# Patient Record
Sex: Female | Born: 1950 | Race: White | Hispanic: No | Smoking: Former smoker
Health system: Southern US, Community
[De-identification: ages and names within clinical notes are randomized; demographics above are authoritative.]

## PROBLEM LIST (undated history)

## (undated) DIAGNOSIS — K219 Gastro-esophageal reflux disease without esophagitis: Secondary | ICD-10-CM

## (undated) DIAGNOSIS — J449 Chronic obstructive pulmonary disease, unspecified: Secondary | ICD-10-CM

## (undated) DIAGNOSIS — J439 Emphysema, unspecified: Secondary | ICD-10-CM

## (undated) DIAGNOSIS — R32 Unspecified urinary incontinence: Secondary | ICD-10-CM

## (undated) DIAGNOSIS — Z8619 Personal history of other infectious and parasitic diseases: Secondary | ICD-10-CM

## (undated) DIAGNOSIS — R519 Headache, unspecified: Secondary | ICD-10-CM

## (undated) DIAGNOSIS — C801 Malignant (primary) neoplasm, unspecified: Secondary | ICD-10-CM

## (undated) DIAGNOSIS — M1990 Unspecified osteoarthritis, unspecified site: Secondary | ICD-10-CM

## (undated) DIAGNOSIS — T7840XA Allergy, unspecified, initial encounter: Secondary | ICD-10-CM

## (undated) HISTORY — PX: COLON SURGERY: SHX602

## (undated) HISTORY — DX: Gastro-esophageal reflux disease without esophagitis: K21.9

## (undated) HISTORY — DX: Headache, unspecified: R51.9

## (undated) HISTORY — DX: Emphysema, unspecified: J43.9

## (undated) HISTORY — DX: Allergy, unspecified, initial encounter: T78.40XA

## (undated) HISTORY — DX: Personal history of other infectious and parasitic diseases: Z86.19

## (undated) HISTORY — DX: Unspecified urinary incontinence: R32

## (undated) HISTORY — DX: Unspecified osteoarthritis, unspecified site: M19.90

---

## 2004-06-04 ENCOUNTER — Emergency Department: Payer: Self-pay | Admitting: Emergency Medicine

## 2004-09-07 ENCOUNTER — Emergency Department: Payer: Self-pay | Admitting: Internal Medicine

## 2005-01-22 ENCOUNTER — Emergency Department: Payer: Self-pay | Admitting: Emergency Medicine

## 2005-03-26 ENCOUNTER — Emergency Department: Payer: Self-pay | Admitting: Emergency Medicine

## 2005-03-26 ENCOUNTER — Other Ambulatory Visit: Payer: Self-pay

## 2005-05-18 ENCOUNTER — Emergency Department: Payer: Self-pay | Admitting: Emergency Medicine

## 2005-07-02 ENCOUNTER — Emergency Department: Payer: Self-pay | Admitting: Emergency Medicine

## 2005-09-22 ENCOUNTER — Emergency Department: Payer: Self-pay | Admitting: Emergency Medicine

## 2005-09-30 ENCOUNTER — Emergency Department: Payer: Self-pay | Admitting: Internal Medicine

## 2006-01-03 ENCOUNTER — Emergency Department: Payer: Self-pay | Admitting: Internal Medicine

## 2007-04-01 ENCOUNTER — Emergency Department: Payer: Self-pay | Admitting: Emergency Medicine

## 2007-05-19 ENCOUNTER — Other Ambulatory Visit: Payer: Self-pay

## 2007-05-19 ENCOUNTER — Emergency Department: Payer: Self-pay | Admitting: Emergency Medicine

## 2007-08-17 ENCOUNTER — Emergency Department: Payer: Self-pay

## 2007-12-13 ENCOUNTER — Emergency Department: Payer: Self-pay | Admitting: Emergency Medicine

## 2008-01-17 ENCOUNTER — Emergency Department: Payer: Self-pay | Admitting: Emergency Medicine

## 2008-08-27 ENCOUNTER — Emergency Department: Payer: Self-pay | Admitting: Emergency Medicine

## 2008-08-30 ENCOUNTER — Emergency Department: Payer: Self-pay | Admitting: Emergency Medicine

## 2008-09-14 ENCOUNTER — Emergency Department: Payer: Self-pay | Admitting: Emergency Medicine

## 2008-09-27 ENCOUNTER — Ambulatory Visit: Payer: Self-pay | Admitting: Internal Medicine

## 2009-03-10 ENCOUNTER — Emergency Department: Payer: Self-pay | Admitting: Internal Medicine

## 2009-11-02 ENCOUNTER — Emergency Department: Payer: Self-pay | Admitting: Emergency Medicine

## 2009-12-16 ENCOUNTER — Emergency Department: Payer: Self-pay | Admitting: Emergency Medicine

## 2010-02-01 ENCOUNTER — Emergency Department: Payer: Self-pay | Admitting: Emergency Medicine

## 2010-12-07 ENCOUNTER — Emergency Department: Payer: Self-pay | Admitting: Unknown Physician Specialty

## 2011-01-19 ENCOUNTER — Emergency Department: Payer: Self-pay | Admitting: Emergency Medicine

## 2011-05-12 ENCOUNTER — Emergency Department: Payer: Self-pay | Admitting: Emergency Medicine

## 2011-07-31 ENCOUNTER — Emergency Department: Payer: Self-pay | Admitting: Emergency Medicine

## 2011-10-31 ENCOUNTER — Emergency Department: Payer: Self-pay | Admitting: *Deleted

## 2011-10-31 LAB — COMPREHENSIVE METABOLIC PANEL
Albumin: 3.7 g/dL (ref 3.4–5.0)
Alkaline Phosphatase: 75 U/L (ref 50–136)
Anion Gap: 11 (ref 7–16)
BUN: 21 mg/dL — ABNORMAL HIGH (ref 7–18)
Bilirubin,Total: 0.5 mg/dL (ref 0.2–1.0)
Calcium, Total: 8.7 mg/dL (ref 8.5–10.1)
Chloride: 104 mmol/L (ref 98–107)
Co2: 28 mmol/L (ref 21–32)
Creatinine: 0.74 mg/dL (ref 0.60–1.30)
EGFR (African American): >60
EGFR (Non-African Amer.): >60
Glucose: 63 mg/dL — ABNORMAL LOW (ref 65–99)
Osmolality: 286 (ref 275–301)
Potassium: 4.5 mmol/L (ref 3.5–5.1)
SGOT(AST): 21 U/L (ref 15–37)
SGPT (ALT): 16 U/L
Sodium: 143 mmol/L (ref 136–145)
Total Protein: 7 g/dL (ref 6.4–8.2)

## 2011-10-31 LAB — CBC
HCT: 40.2 % (ref 35.0–47.0)
HGB: 13.5 g/dL (ref 12.0–16.0)
MCH: 30 pg (ref 26.0–34.0)
MCHC: 33.6 g/dL (ref 32.0–36.0)
MCV: 89 fL (ref 80–100)
Platelet: 213 10*3/uL (ref 150–440)
RBC: 4.51 10*6/uL (ref 3.80–5.20)
RDW: 13.3 % (ref 11.5–14.5)
WBC: 5.9 10*3/uL (ref 3.6–11.0)

## 2011-10-31 LAB — CK TOTAL AND CKMB (NOT AT ARMC)
CK, Total: 102 U/L (ref 21–215)
CK-MB: 2.2 ng/mL (ref 0.5–3.6)

## 2011-10-31 LAB — TROPONIN I: Troponin-I: <0.02 ng/mL

## 2012-08-06 ENCOUNTER — Emergency Department: Payer: Self-pay | Admitting: Emergency Medicine

## 2012-08-06 LAB — COMPREHENSIVE METABOLIC PANEL
Albumin: 3.6 g/dL (ref 3.4–5.0)
Alkaline Phosphatase: 107 U/L (ref 50–136)
Anion Gap: 4 — ABNORMAL LOW (ref 7–16)
BUN: 11 mg/dL (ref 7–18)
Bilirubin,Total: 0.4 mg/dL (ref 0.2–1.0)
Calcium, Total: 8.4 mg/dL — ABNORMAL LOW (ref 8.5–10.1)
Chloride: 108 mmol/L — ABNORMAL HIGH (ref 98–107)
Co2: 30 mmol/L (ref 21–32)
Creatinine: 0.61 mg/dL (ref 0.60–1.30)
EGFR (African American): >60
EGFR (Non-African Amer.): >60
Glucose: 82 mg/dL (ref 65–99)
Osmolality: 282 (ref 275–301)
Potassium: 4.2 mmol/L (ref 3.5–5.1)
SGOT(AST): 22 U/L (ref 15–37)
SGPT (ALT): 17 U/L (ref 12–78)
Sodium: 142 mmol/L (ref 136–145)
Total Protein: 6.3 g/dL — ABNORMAL LOW (ref 6.4–8.2)

## 2012-08-06 LAB — URINALYSIS, COMPLETE
Bacteria: NONE SEEN
Bilirubin,UR: NEGATIVE
Blood: NEGATIVE
Glucose,UR: NEGATIVE mg/dL (ref 0–75)
Ketone: NEGATIVE
Leukocyte Esterase: NEGATIVE
Nitrite: NEGATIVE
Ph: 5 (ref 4.5–8.0)
Protein: NEGATIVE
RBC,UR: NONE SEEN /HPF (ref 0–5)
Specific Gravity: 1.011 (ref 1.003–1.030)
Squamous Epithelial: <1
WBC UR: <1 /HPF (ref 0–5)

## 2012-08-06 LAB — CBC
HCT: 40.3 % (ref 35.0–47.0)
HGB: 13.4 g/dL (ref 12.0–16.0)
MCH: 29.4 pg (ref 26.0–34.0)
MCHC: 33.1 g/dL (ref 32.0–36.0)
MCV: 89 fL (ref 80–100)
Platelet: 180 10*3/uL (ref 150–440)
RBC: 4.54 10*6/uL (ref 3.80–5.20)
RDW: 13.3 % (ref 11.5–14.5)
WBC: 5.9 10*3/uL (ref 3.6–11.0)

## 2012-09-04 ENCOUNTER — Emergency Department: Payer: Self-pay | Admitting: Emergency Medicine

## 2013-09-29 ENCOUNTER — Emergency Department: Payer: Self-pay | Admitting: Emergency Medicine

## 2013-12-26 ENCOUNTER — Emergency Department: Payer: Self-pay | Admitting: Emergency Medicine

## 2013-12-26 LAB — COMPREHENSIVE METABOLIC PANEL
Albumin: 3.3 g/dL — ABNORMAL LOW (ref 3.4–5.0)
Alkaline Phosphatase: 91 U/L
Anion Gap: 6 — ABNORMAL LOW (ref 7–16)
BUN: 15 mg/dL (ref 7–18)
Bilirubin,Total: 0.3 mg/dL (ref 0.2–1.0)
Calcium, Total: 9 mg/dL (ref 8.5–10.1)
Chloride: 106 mmol/L (ref 98–107)
Co2: 29 mmol/L (ref 21–32)
Creatinine: 0.7 mg/dL (ref 0.60–1.30)
EGFR (African American): >60
EGFR (Non-African Amer.): >60
Glucose: 79 mg/dL (ref 65–99)
Osmolality: 281 (ref 275–301)
Potassium: 3.5 mmol/L (ref 3.5–5.1)
SGOT(AST): 18 U/L (ref 15–37)
SGPT (ALT): 21 U/L (ref 12–78)
Sodium: 141 mmol/L (ref 136–145)
Total Protein: 6.4 g/dL (ref 6.4–8.2)

## 2013-12-26 LAB — URINALYSIS, COMPLETE
Bacteria: NONE SEEN
Bilirubin,UR: NEGATIVE
Glucose,UR: NEGATIVE mg/dL (ref 0–75)
Ketone: NEGATIVE
Leukocyte Esterase: NEGATIVE
Nitrite: NEGATIVE
Ph: 5 (ref 4.5–8.0)
Protein: NEGATIVE
RBC,UR: NONE SEEN /HPF (ref 0–5)
Specific Gravity: 1.01 (ref 1.003–1.030)
Squamous Epithelial: <1
WBC UR: 1 /HPF (ref 0–5)

## 2013-12-26 LAB — CBC WITH DIFFERENTIAL/PLATELET
Basophil #: 0.1 10*3/uL (ref 0.0–0.1)
Basophil %: 0.7 %
Eosinophil #: 0.2 10*3/uL (ref 0.0–0.7)
Eosinophil %: 2.8 %
HCT: 42.9 % (ref 35.0–47.0)
HGB: 13.8 g/dL (ref 12.0–16.0)
Lymphocyte #: 3.3 10*3/uL (ref 1.0–3.6)
Lymphocyte %: 37.8 %
MCH: 29.3 pg (ref 26.0–34.0)
MCHC: 32.3 g/dL (ref 32.0–36.0)
MCV: 91 fL (ref 80–100)
Monocyte #: 0.5 x10 3/mm (ref 0.2–0.9)
Monocyte %: 5.9 %
Neutrophil #: 4.6 10*3/uL (ref 1.4–6.5)
Neutrophil %: 52.8 %
Platelet: 223 10*3/uL (ref 150–440)
RBC: 4.73 10*6/uL (ref 3.80–5.20)
RDW: 13.9 % (ref 11.5–14.5)
WBC: 8.7 10*3/uL (ref 3.6–11.0)

## 2013-12-26 LAB — LIPASE, BLOOD: Lipase: 234 U/L (ref 73–393)

## 2014-01-13 ENCOUNTER — Emergency Department: Payer: Self-pay | Admitting: Emergency Medicine

## 2014-01-13 LAB — COMPREHENSIVE METABOLIC PANEL
Albumin: 3.4 g/dL (ref 3.4–5.0)
Alkaline Phosphatase: 95 U/L
Anion Gap: 4 — ABNORMAL LOW (ref 7–16)
BUN: 9 mg/dL (ref 7–18)
Bilirubin,Total: 1.2 mg/dL — ABNORMAL HIGH (ref 0.2–1.0)
Calcium, Total: 8.9 mg/dL (ref 8.5–10.1)
Chloride: 103 mmol/L (ref 98–107)
Co2: 31 mmol/L (ref 21–32)
Creatinine: 0.67 mg/dL (ref 0.60–1.30)
EGFR (African American): >60
EGFR (Non-African Amer.): >60
Glucose: 98 mg/dL (ref 65–99)
Osmolality: 274 (ref 275–301)
Potassium: 3.5 mmol/L (ref 3.5–5.1)
SGOT(AST): 14 U/L — ABNORMAL LOW (ref 15–37)
SGPT (ALT): 15 U/L (ref 12–78)
Sodium: 138 mmol/L (ref 136–145)
Total Protein: 7.1 g/dL (ref 6.4–8.2)

## 2014-01-13 LAB — URINALYSIS, COMPLETE
Bacteria: NONE SEEN
Bilirubin,UR: NEGATIVE
Glucose,UR: NEGATIVE mg/dL (ref 0–75)
Leukocyte Esterase: NEGATIVE
Nitrite: NEGATIVE
Ph: 5 (ref 4.5–8.0)
Protein: NEGATIVE
RBC,UR: 1 /HPF (ref 0–5)
Specific Gravity: 1.012 (ref 1.003–1.030)
Squamous Epithelial: 1
WBC UR: <1 /HPF (ref 0–5)

## 2014-01-13 LAB — CK TOTAL AND CKMB (NOT AT ARMC)
CK, Total: 74 U/L
CK-MB: 1.1 ng/mL (ref 0.5–3.6)

## 2014-01-13 LAB — CBC WITH DIFFERENTIAL/PLATELET
Basophil #: 0.1 10*3/uL (ref 0.0–0.1)
Basophil %: 0.8 %
Eosinophil #: 0.1 10*3/uL (ref 0.0–0.7)
Eosinophil %: 0.4 %
HCT: 42.7 % (ref 35.0–47.0)
HGB: 14.1 g/dL (ref 12.0–16.0)
Lymphocyte #: 1.5 10*3/uL (ref 1.0–3.6)
Lymphocyte %: 9.4 %
MCH: 29.8 pg (ref 26.0–34.0)
MCHC: 33.1 g/dL (ref 32.0–36.0)
MCV: 90 fL (ref 80–100)
Monocyte #: 1.1 x10 3/mm — ABNORMAL HIGH (ref 0.2–0.9)
Monocyte %: 7.3 %
Neutrophil #: 12.7 10*3/uL — ABNORMAL HIGH (ref 1.4–6.5)
Neutrophil %: 82.1 %
Platelet: 206 10*3/uL (ref 150–440)
RBC: 4.75 10*6/uL (ref 3.80–5.20)
RDW: 13.8 % (ref 11.5–14.5)
WBC: 15.5 10*3/uL — ABNORMAL HIGH (ref 3.6–11.0)

## 2014-01-13 LAB — TROPONIN I: Troponin-I: <0.02 ng/mL

## 2014-01-30 DIAGNOSIS — K625 Hemorrhage of anus and rectum: Secondary | ICD-10-CM | POA: Insufficient documentation

## 2014-02-27 ENCOUNTER — Ambulatory Visit: Payer: Self-pay | Admitting: Surgery

## 2014-03-06 ENCOUNTER — Other Ambulatory Visit: Payer: Self-pay | Admitting: Surgery

## 2014-03-07 LAB — CEA: CEA: 3.3 ng/mL (ref 0.0–4.7)

## 2014-03-12 ENCOUNTER — Ambulatory Visit: Payer: Self-pay | Admitting: Surgery

## 2014-03-12 LAB — CBC WITH DIFFERENTIAL/PLATELET
Basophil #: 0 10*3/uL (ref 0.0–0.1)
Basophil %: 0.6 %
Eosinophil #: 0.1 10*3/uL (ref 0.0–0.7)
Eosinophil %: 2.3 %
HCT: 43.3 % (ref 35.0–47.0)
HGB: 13.9 g/dL (ref 12.0–16.0)
Lymphocyte #: 1.8 10*3/uL (ref 1.0–3.6)
Lymphocyte %: 28.6 %
MCH: 29.1 pg (ref 26.0–34.0)
MCHC: 32.1 g/dL (ref 32.0–36.0)
MCV: 91 fL (ref 80–100)
Monocyte #: 0.4 x10 3/mm (ref 0.2–0.9)
Monocyte %: 5.6 %
Neutrophil #: 4 10*3/uL (ref 1.4–6.5)
Neutrophil %: 62.9 %
Platelet: 197 10*3/uL (ref 150–440)
RBC: 4.78 10*6/uL (ref 3.80–5.20)
RDW: 13.7 % (ref 11.5–14.5)
WBC: 6.4 10*3/uL (ref 3.6–11.0)

## 2014-03-12 LAB — BASIC METABOLIC PANEL
Anion Gap: 7 (ref 7–16)
BUN: 10 mg/dL (ref 7–18)
Calcium, Total: 8.4 mg/dL — ABNORMAL LOW (ref 8.5–10.1)
Chloride: 106 mmol/L (ref 98–107)
Co2: 28 mmol/L (ref 21–32)
Creatinine: 0.75 mg/dL (ref 0.60–1.30)
EGFR (African American): >60
EGFR (Non-African Amer.): >60
Glucose: 113 mg/dL — ABNORMAL HIGH (ref 65–99)
Osmolality: 281 (ref 275–301)
Potassium: 3.6 mmol/L (ref 3.5–5.1)
Sodium: 141 mmol/L (ref 136–145)

## 2014-03-21 ENCOUNTER — Inpatient Hospital Stay: Payer: Self-pay | Admitting: Surgery

## 2014-03-21 LAB — CREATININE, SERUM
Creatinine: 1.03 mg/dL (ref 0.60–1.30)
EGFR (African American): >60
EGFR (Non-African Amer.): 57 — ABNORMAL LOW

## 2014-03-22 LAB — CBC WITH DIFFERENTIAL/PLATELET
Basophil #: 0 10*3/uL (ref 0.0–0.1)
Basophil %: 0.2 %
Eosinophil #: 0 10*3/uL (ref 0.0–0.7)
Eosinophil %: 0 %
HCT: 39.2 % (ref 35.0–47.0)
HGB: 12.7 g/dL (ref 12.0–16.0)
Lymphocyte #: 1 10*3/uL (ref 1.0–3.6)
Lymphocyte %: 8.2 %
MCH: 29.2 pg (ref 26.0–34.0)
MCHC: 32.3 g/dL (ref 32.0–36.0)
MCV: 90 fL (ref 80–100)
Monocyte #: 1.1 x10 3/mm — ABNORMAL HIGH (ref 0.2–0.9)
Monocyte %: 9 %
Neutrophil #: 10 10*3/uL — ABNORMAL HIGH (ref 1.4–6.5)
Neutrophil %: 82.6 %
Platelet: 170 10*3/uL (ref 150–440)
RBC: 4.34 10*6/uL (ref 3.80–5.20)
RDW: 13.9 % (ref 11.5–14.5)
WBC: 12.1 10*3/uL — ABNORMAL HIGH (ref 3.6–11.0)

## 2014-03-22 LAB — BASIC METABOLIC PANEL
Anion Gap: 6 — ABNORMAL LOW (ref 7–16)
BUN: 12 mg/dL (ref 7–18)
Calcium, Total: 8.8 mg/dL (ref 8.5–10.1)
Chloride: 105 mmol/L (ref 98–107)
Co2: 29 mmol/L (ref 21–32)
Creatinine: 0.66 mg/dL (ref 0.60–1.30)
EGFR (African American): >60
EGFR (Non-African Amer.): >60
Glucose: 116 mg/dL — ABNORMAL HIGH (ref 65–99)
Osmolality: 280 (ref 275–301)
Potassium: 4.2 mmol/L (ref 3.5–5.1)
Sodium: 140 mmol/L (ref 136–145)

## 2014-03-23 LAB — CBC WITH DIFFERENTIAL/PLATELET
Basophil #: 0 10*3/uL (ref 0.0–0.1)
Basophil %: 0.3 %
Eosinophil #: 0.1 10*3/uL (ref 0.0–0.7)
Eosinophil %: 0.7 %
HCT: 40.7 % (ref 35.0–47.0)
HGB: 13.2 g/dL (ref 12.0–16.0)
Lymphocyte #: 1.4 10*3/uL (ref 1.0–3.6)
Lymphocyte %: 14.2 %
MCH: 29.1 pg (ref 26.0–34.0)
MCHC: 32.3 g/dL (ref 32.0–36.0)
MCV: 90 fL (ref 80–100)
Monocyte #: 0.8 x10 3/mm (ref 0.2–0.9)
Monocyte %: 8.7 %
Neutrophil #: 7.3 10*3/uL — ABNORMAL HIGH (ref 1.4–6.5)
Neutrophil %: 76.1 %
Platelet: 179 10*3/uL (ref 150–440)
RBC: 4.52 10*6/uL (ref 3.80–5.20)
RDW: 14 % (ref 11.5–14.5)
WBC: 9.6 10*3/uL (ref 3.6–11.0)

## 2014-03-26 LAB — BASIC METABOLIC PANEL
Anion Gap: 5 — ABNORMAL LOW (ref 7–16)
BUN: 11 mg/dL (ref 7–18)
Calcium, Total: 8.8 mg/dL (ref 8.5–10.1)
Chloride: 106 mmol/L (ref 98–107)
Co2: 27 mmol/L (ref 21–32)
Creatinine: 0.93 mg/dL (ref 0.60–1.30)
EGFR (African American): >60
EGFR (Non-African Amer.): >60
Glucose: 86 mg/dL (ref 65–99)
Osmolality: 274 (ref 275–301)
Potassium: 4.4 mmol/L (ref 3.5–5.1)
Sodium: 138 mmol/L (ref 136–145)

## 2014-03-26 LAB — MAGNESIUM: Magnesium: 1.8 mg/dL

## 2014-03-28 LAB — CBC WITH DIFFERENTIAL/PLATELET
Basophil #: 0 10*3/uL (ref 0.0–0.1)
Basophil %: 0.7 %
Eosinophil #: 0.3 10*3/uL (ref 0.0–0.7)
Eosinophil %: 3.7 %
HCT: 39.6 % (ref 35.0–47.0)
HGB: 12.9 g/dL (ref 12.0–16.0)
Lymphocyte #: 1.2 10*3/uL (ref 1.0–3.6)
Lymphocyte %: 17.5 %
MCH: 29.8 pg (ref 26.0–34.0)
MCHC: 32.6 g/dL (ref 32.0–36.0)
MCV: 91 fL (ref 80–100)
Monocyte #: 0.7 x10 3/mm (ref 0.2–0.9)
Monocyte %: 9.6 %
Neutrophil #: 4.7 10*3/uL (ref 1.4–6.5)
Neutrophil %: 68.5 %
Platelet: 207 10*3/uL (ref 150–440)
RBC: 4.33 10*6/uL (ref 3.80–5.20)
RDW: 13.7 % (ref 11.5–14.5)
WBC: 6.9 10*3/uL (ref 3.6–11.0)

## 2014-03-28 LAB — COMPREHENSIVE METABOLIC PANEL
Albumin: 2.6 g/dL — ABNORMAL LOW (ref 3.4–5.0)
Alkaline Phosphatase: 58 U/L
Anion Gap: 8 (ref 7–16)
BUN: 12 mg/dL (ref 7–18)
Bilirubin,Total: 0.6 mg/dL (ref 0.2–1.0)
Calcium, Total: 8.5 mg/dL (ref 8.5–10.1)
Chloride: 108 mmol/L — ABNORMAL HIGH (ref 98–107)
Co2: 23 mmol/L (ref 21–32)
Creatinine: 0.69 mg/dL (ref 0.60–1.30)
EGFR (African American): >60
EGFR (Non-African Amer.): >60
Glucose: 112 mg/dL — ABNORMAL HIGH (ref 65–99)
Osmolality: 278 (ref 275–301)
Potassium: 4.1 mmol/L (ref 3.5–5.1)
SGOT(AST): 37 U/L (ref 15–37)
SGPT (ALT): 28 U/L
Sodium: 139 mmol/L (ref 136–145)
Total Protein: 5.7 g/dL — ABNORMAL LOW (ref 6.4–8.2)

## 2014-03-28 LAB — PATHOLOGY REPORT

## 2014-04-02 LAB — BASIC METABOLIC PANEL
Anion Gap: 2 — ABNORMAL LOW (ref 7–16)
BUN: 7 mg/dL (ref 7–18)
Calcium, Total: 7.9 mg/dL — ABNORMAL LOW (ref 8.5–10.1)
Chloride: 107 mmol/L (ref 98–107)
Co2: 28 mmol/L (ref 21–32)
Creatinine: 0.66 mg/dL (ref 0.60–1.30)
EGFR (African American): >60
EGFR (Non-African Amer.): >60
Glucose: 123 mg/dL — ABNORMAL HIGH (ref 65–99)
Osmolality: 273 (ref 275–301)
Potassium: 3.3 mmol/L — ABNORMAL LOW (ref 3.5–5.1)
Sodium: 137 mmol/L (ref 136–145)

## 2014-04-02 LAB — CBC WITH DIFFERENTIAL/PLATELET
Basophil #: 0.1 10*3/uL (ref 0.0–0.1)
Basophil %: 0.4 %
Eosinophil #: 0 10*3/uL (ref 0.0–0.7)
Eosinophil %: 0.2 %
HCT: 35.3 % (ref 35.0–47.0)
HGB: 11.6 g/dL — ABNORMAL LOW (ref 12.0–16.0)
Lymphocyte #: 0.6 10*3/uL — ABNORMAL LOW (ref 1.0–3.6)
Lymphocyte %: 4.2 %
MCH: 29.3 pg (ref 26.0–34.0)
MCHC: 32.9 g/dL (ref 32.0–36.0)
MCV: 89 fL (ref 80–100)
Monocyte #: 1.1 x10 3/mm — ABNORMAL HIGH (ref 0.2–0.9)
Monocyte %: 8.2 %
Neutrophil #: 11.5 10*3/uL — ABNORMAL HIGH (ref 1.4–6.5)
Neutrophil %: 87 %
Platelet: 200 10*3/uL (ref 150–440)
RBC: 3.96 10*6/uL (ref 3.80–5.20)
RDW: 14 % (ref 11.5–14.5)
WBC: 13.2 10*3/uL — ABNORMAL HIGH (ref 3.6–11.0)

## 2014-04-03 LAB — COMPREHENSIVE METABOLIC PANEL
Albumin: 2 g/dL — ABNORMAL LOW (ref 3.4–5.0)
Alkaline Phosphatase: 68 U/L
BUN: 8 mg/dL (ref 7–18)
Bilirubin,Total: 0.6 mg/dL (ref 0.2–1.0)
Calcium, Total: 8 mg/dL — ABNORMAL LOW (ref 8.5–10.1)
Chloride: 105 mmol/L (ref 98–107)
Co2: 30 mmol/L (ref 21–32)
Creatinine: 0.6 mg/dL (ref 0.60–1.30)
EGFR (African American): >60
EGFR (Non-African Amer.): >60
Glucose: 110 mg/dL — ABNORMAL HIGH (ref 65–99)
Osmolality: 263 (ref 275–301)
Potassium: 3 mmol/L — ABNORMAL LOW (ref 3.5–5.1)
SGOT(AST): 13 U/L — ABNORMAL LOW (ref 15–37)
SGPT (ALT): 14 U/L
Sodium: 132 mmol/L — ABNORMAL LOW (ref 136–145)
Total Protein: 5.6 g/dL — ABNORMAL LOW (ref 6.4–8.2)

## 2014-04-03 LAB — CBC WITH DIFFERENTIAL/PLATELET
Basophil #: 0 10*3/uL (ref 0.0–0.1)
Basophil %: 0.2 %
Eosinophil #: 0 10*3/uL (ref 0.0–0.7)
Eosinophil %: 0.3 %
HCT: 35 % (ref 35.0–47.0)
HGB: 11.4 g/dL — ABNORMAL LOW (ref 12.0–16.0)
Lymphocyte #: 0.9 10*3/uL — ABNORMAL LOW (ref 1.0–3.6)
Lymphocyte %: 8.4 %
MCH: 29.2 pg (ref 26.0–34.0)
MCHC: 32.7 g/dL (ref 32.0–36.0)
MCV: 89 fL (ref 80–100)
Monocyte #: 1.1 x10 3/mm — ABNORMAL HIGH (ref 0.2–0.9)
Monocyte %: 10.2 %
Neutrophil #: 8.8 10*3/uL — ABNORMAL HIGH (ref 1.4–6.5)
Neutrophil %: 80.9 %
Platelet: 219 10*3/uL (ref 150–440)
RBC: 3.91 10*6/uL (ref 3.80–5.20)
RDW: 13.9 % (ref 11.5–14.5)
WBC: 10.9 10*3/uL (ref 3.6–11.0)

## 2014-04-03 LAB — PATHOLOGY REPORT

## 2014-04-06 LAB — BASIC METABOLIC PANEL
Anion Gap: 7 (ref 7–16)
BUN: 12 mg/dL (ref 7–18)
Calcium, Total: 8.9 mg/dL (ref 8.5–10.1)
Chloride: 98 mmol/L (ref 98–107)
Co2: 32 mmol/L (ref 21–32)
Creatinine: 0.63 mg/dL (ref 0.60–1.30)
EGFR (African American): >60
EGFR (Non-African Amer.): >60
Glucose: 121 mg/dL — ABNORMAL HIGH (ref 65–99)
Osmolality: 275 (ref 275–301)
Potassium: 4 mmol/L (ref 3.5–5.1)
Sodium: 137 mmol/L (ref 136–145)

## 2014-04-06 LAB — CBC WITH DIFFERENTIAL/PLATELET
Basophil #: 0 10*3/uL (ref 0.0–0.1)
Basophil %: 0.4 %
Eosinophil #: 0.1 10*3/uL (ref 0.0–0.7)
Eosinophil %: 1.4 %
HCT: 31.2 % — ABNORMAL LOW (ref 35.0–47.0)
HGB: 10.3 g/dL — ABNORMAL LOW (ref 12.0–16.0)
Lymphocyte #: 0.9 10*3/uL — ABNORMAL LOW (ref 1.0–3.6)
Lymphocyte %: 12.1 %
MCH: 29.3 pg (ref 26.0–34.0)
MCHC: 33.1 g/dL (ref 32.0–36.0)
MCV: 89 fL (ref 80–100)
Monocyte #: 0.9 x10 3/mm (ref 0.2–0.9)
Monocyte %: 12 %
Neutrophil #: 5.8 10*3/uL (ref 1.4–6.5)
Neutrophil %: 74.1 %
Platelet: 314 10*3/uL (ref 150–440)
RBC: 3.53 10*6/uL — ABNORMAL LOW (ref 3.80–5.20)
RDW: 13.9 % (ref 11.5–14.5)
WBC: 7.8 10*3/uL (ref 3.6–11.0)

## 2014-04-06 LAB — MAGNESIUM: Magnesium: 1.2 mg/dL — ABNORMAL LOW

## 2014-04-06 LAB — PHOSPHORUS: Phosphorus: 2.9 mg/dL (ref 2.5–4.9)

## 2014-04-07 DIAGNOSIS — I369 Nonrheumatic tricuspid valve disorder, unspecified: Secondary | ICD-10-CM

## 2014-04-07 LAB — TPN PANEL
Activated PTT: 32.7 secs (ref 23.6–35.9)
Albumin: 1.6 g/dL — ABNORMAL LOW (ref 3.4–5.0)
Alkaline Phosphatase: 69 U/L
Anion Gap: 5 — ABNORMAL LOW (ref 7–16)
BUN: 11 mg/dL (ref 7–18)
Calcium, Total: 8.7 mg/dL (ref 8.5–10.1)
Chloride: 102 mmol/L (ref 98–107)
Cholesterol: 107 mg/dL (ref 0–200)
Co2: 33 mmol/L — ABNORMAL HIGH (ref 21–32)
Creatinine: 0.61 mg/dL (ref 0.60–1.30)
EGFR (African American): >60
EGFR (Non-African Amer.): >60
Glucose: 167 mg/dL — ABNORMAL HIGH (ref 65–99)
HGB: 10.4 g/dL — ABNORMAL LOW (ref 12.0–16.0)
INR: 1.1
Magnesium: 2.2 mg/dL
Osmolality: 283 (ref 275–301)
Phosphorus: 2.8 mg/dL (ref 2.5–4.9)
Platelet: 317 10*3/uL (ref 150–440)
Potassium: 3.8 mmol/L (ref 3.5–5.1)
Prothrombin Time: 14.4 secs (ref 11.5–14.7)
SGOT(AST): 11 U/L — ABNORMAL LOW (ref 15–37)
Sodium: 140 mmol/L (ref 136–145)
Total Protein: 5.4 g/dL — ABNORMAL LOW (ref 6.4–8.2)
Triglycerides: 65 mg/dL (ref 0–200)
WBC: 10.3 10*3/uL (ref 3.6–11.0)

## 2014-04-08 LAB — CULTURE, BLOOD (SINGLE)

## 2014-04-08 LAB — BRONCHIAL WASH CULTURE

## 2014-04-08 LAB — PHOSPHORUS: Phosphorus: 2.8 mg/dL (ref 2.5–4.9)

## 2014-04-08 LAB — POTASSIUM: Potassium: 3.8 mmol/L (ref 3.5–5.1)

## 2014-04-08 LAB — CALCIUM: Calcium, Total: 8.3 mg/dL — ABNORMAL LOW (ref 8.5–10.1)

## 2014-04-08 LAB — SODIUM: Sodium: 144 mmol/L (ref 136–145)

## 2014-04-08 LAB — MAGNESIUM: Magnesium: 2.1 mg/dL

## 2014-04-09 LAB — PHOSPHORUS: Phosphorus: 3.4 mg/dL (ref 2.5–4.9)

## 2014-04-09 LAB — POTASSIUM: Potassium: 4 mmol/L (ref 3.5–5.1)

## 2014-04-09 LAB — SODIUM: Sodium: 142 mmol/L (ref 136–145)

## 2014-04-09 LAB — MAGNESIUM: Magnesium: 1.9 mg/dL

## 2014-04-09 LAB — CALCIUM: Calcium, Total: 8.4 mg/dL — ABNORMAL LOW (ref 8.5–10.1)

## 2014-04-10 LAB — BASIC METABOLIC PANEL
Anion Gap: 9 (ref 7–16)
BUN: 16 mg/dL (ref 7–18)
Calcium, Total: 8.2 mg/dL — ABNORMAL LOW (ref 8.5–10.1)
Chloride: 102 mmol/L (ref 98–107)
Co2: 29 mmol/L (ref 21–32)
Creatinine: 0.52 mg/dL — ABNORMAL LOW (ref 0.60–1.30)
EGFR (African American): >60
EGFR (Non-African Amer.): >60
Glucose: 91 mg/dL (ref 65–99)
Osmolality: 280 (ref 275–301)
Potassium: 3.7 mmol/L (ref 3.5–5.1)
Sodium: 140 mmol/L (ref 136–145)

## 2014-04-10 LAB — PHOSPHORUS: Phosphorus: 3.5 mg/dL (ref 2.5–4.9)

## 2014-04-10 LAB — MAGNESIUM: Magnesium: 1.9 mg/dL

## 2014-04-11 LAB — BASIC METABOLIC PANEL
Anion Gap: 8 (ref 7–16)
BUN: 18 mg/dL (ref 7–18)
Calcium, Total: 8.1 mg/dL — ABNORMAL LOW (ref 8.5–10.1)
Chloride: 100 mmol/L (ref 98–107)
Co2: 29 mmol/L (ref 21–32)
Creatinine: 0.58 mg/dL — ABNORMAL LOW (ref 0.60–1.30)
EGFR (African American): >60
EGFR (Non-African Amer.): >60
Glucose: 112 mg/dL — ABNORMAL HIGH (ref 65–99)
Osmolality: 276 (ref 275–301)
Potassium: 3.8 mmol/L (ref 3.5–5.1)
Sodium: 137 mmol/L (ref 136–145)

## 2014-04-11 LAB — PHOSPHORUS: Phosphorus: 3.3 mg/dL (ref 2.5–4.9)

## 2014-04-11 LAB — MAGNESIUM: Magnesium: 2 mg/dL

## 2014-05-03 ENCOUNTER — Ambulatory Visit: Payer: Self-pay | Admitting: Surgery

## 2014-05-16 DIAGNOSIS — J449 Chronic obstructive pulmonary disease, unspecified: Secondary | ICD-10-CM | POA: Insufficient documentation

## 2014-06-25 ENCOUNTER — Emergency Department: Payer: Self-pay | Admitting: Emergency Medicine

## 2014-06-25 LAB — COMPREHENSIVE METABOLIC PANEL
Albumin: 3.6 g/dL (ref 3.4–5.0)
Alkaline Phosphatase: 79 U/L
Anion Gap: 5 — ABNORMAL LOW (ref 7–16)
BUN: 14 mg/dL (ref 7–18)
Bilirubin,Total: 0.7 mg/dL (ref 0.2–1.0)
Calcium, Total: 8.8 mg/dL (ref 8.5–10.1)
Chloride: 104 mmol/L (ref 98–107)
Co2: 31 mmol/L (ref 21–32)
Creatinine: 0.65 mg/dL (ref 0.60–1.30)
EGFR (African American): >60
EGFR (Non-African Amer.): >60
Glucose: 115 mg/dL — ABNORMAL HIGH (ref 65–99)
Osmolality: 281 (ref 275–301)
Potassium: 4.2 mmol/L (ref 3.5–5.1)
SGOT(AST): 22 U/L (ref 15–37)
SGPT (ALT): 33 U/L
Sodium: 140 mmol/L (ref 136–145)
Total Protein: 6.3 g/dL — ABNORMAL LOW (ref 6.4–8.2)

## 2014-06-25 LAB — CBC
HCT: 41.1 % (ref 35.0–47.0)
HGB: 13.2 g/dL (ref 12.0–16.0)
MCH: 28 pg (ref 26.0–34.0)
MCHC: 32.1 g/dL (ref 32.0–36.0)
MCV: 88 fL (ref 80–100)
Platelet: 194 10*3/uL (ref 150–440)
RBC: 4.7 10*6/uL (ref 3.80–5.20)
RDW: 15.3 % — ABNORMAL HIGH (ref 11.5–14.5)
WBC: 6.6 10*3/uL (ref 3.6–11.0)

## 2014-06-25 LAB — URINALYSIS, COMPLETE
Bacteria: NONE SEEN
Bilirubin,UR: NEGATIVE
Blood: NEGATIVE
Glucose,UR: NEGATIVE mg/dL (ref 0–75)
Hyaline Cast: 32
Ketone: NEGATIVE
Leukocyte Esterase: NEGATIVE
Nitrite: NEGATIVE
Ph: 5 (ref 4.5–8.0)
Protein: 100
RBC,UR: <1 /HPF (ref 0–5)
Specific Gravity: 1.024 (ref 1.003–1.030)
Squamous Epithelial: 1
WBC UR: 5 /HPF (ref 0–5)

## 2014-07-17 ENCOUNTER — Emergency Department: Payer: Self-pay | Admitting: Emergency Medicine

## 2014-07-17 LAB — COMPREHENSIVE METABOLIC PANEL
Albumin: 3.5 g/dL (ref 3.4–5.0)
Alkaline Phosphatase: 87 U/L
Anion Gap: 3 — ABNORMAL LOW (ref 7–16)
BUN: 9 mg/dL (ref 7–18)
Bilirubin,Total: 0.6 mg/dL (ref 0.2–1.0)
Calcium, Total: 8.5 mg/dL (ref 8.5–10.1)
Chloride: 109 mmol/L — ABNORMAL HIGH (ref 98–107)
Co2: 29 mmol/L (ref 21–32)
Creatinine: 0.72 mg/dL (ref 0.60–1.30)
EGFR (African American): >60
EGFR (Non-African Amer.): >60
Glucose: 125 mg/dL — ABNORMAL HIGH (ref 65–99)
Osmolality: 281 (ref 275–301)
Potassium: 3.2 mmol/L — ABNORMAL LOW (ref 3.5–5.1)
SGOT(AST): 20 U/L (ref 15–37)
SGPT (ALT): 27 U/L
Sodium: 141 mmol/L (ref 136–145)
Total Protein: 6.5 g/dL (ref 6.4–8.2)

## 2014-07-17 LAB — LIPASE, BLOOD: Lipase: 139 U/L (ref 73–393)

## 2014-07-17 LAB — CBC
HCT: 41.2 % (ref 35.0–47.0)
HGB: 13.3 g/dL (ref 12.0–16.0)
MCH: 28.3 pg (ref 26.0–34.0)
MCHC: 32.2 g/dL (ref 32.0–36.0)
MCV: 88 fL (ref 80–100)
Platelet: 192 10*3/uL (ref 150–440)
RBC: 4.7 10*6/uL (ref 3.80–5.20)
RDW: 15.3 % — ABNORMAL HIGH (ref 11.5–14.5)
WBC: 6.5 10*3/uL (ref 3.6–11.0)

## 2014-08-14 DIAGNOSIS — J439 Emphysema, unspecified: Secondary | ICD-10-CM | POA: Insufficient documentation

## 2014-08-14 DIAGNOSIS — M5440 Lumbago with sciatica, unspecified side: Secondary | ICD-10-CM | POA: Insufficient documentation

## 2014-08-14 DIAGNOSIS — J432 Centrilobular emphysema: Secondary | ICD-10-CM | POA: Insufficient documentation

## 2014-08-14 DIAGNOSIS — D126 Benign neoplasm of colon, unspecified: Secondary | ICD-10-CM | POA: Insufficient documentation

## 2014-08-17 ENCOUNTER — Ambulatory Visit: Payer: Self-pay | Admitting: Family Medicine

## 2014-09-04 ENCOUNTER — Emergency Department: Payer: Self-pay | Admitting: Emergency Medicine

## 2014-09-04 LAB — TROPONIN I: Troponin-I: <0.02 ng/mL

## 2014-09-04 LAB — COMPREHENSIVE METABOLIC PANEL
Albumin: 3.7 g/dL (ref 3.4–5.0)
Alkaline Phosphatase: 107 U/L
Anion Gap: 5 — ABNORMAL LOW (ref 7–16)
BUN: 14 mg/dL (ref 7–18)
Bilirubin,Total: 0.7 mg/dL (ref 0.2–1.0)
Calcium, Total: 9.1 mg/dL (ref 8.5–10.1)
Chloride: 105 mmol/L (ref 98–107)
Co2: 31 mmol/L (ref 21–32)
Creatinine: 0.7 mg/dL (ref 0.60–1.30)
EGFR (African American): >60
EGFR (Non-African Amer.): >60
Glucose: 126 mg/dL — ABNORMAL HIGH (ref 65–99)
Osmolality: 283 (ref 275–301)
Potassium: 4 mmol/L (ref 3.5–5.1)
SGOT(AST): 31 U/L (ref 15–37)
SGPT (ALT): 28 U/L
Sodium: 141 mmol/L (ref 136–145)
Total Protein: 7 g/dL (ref 6.4–8.2)

## 2014-09-04 LAB — CBC WITH DIFFERENTIAL/PLATELET
Basophil #: 0 10*3/uL (ref 0.0–0.1)
Basophil %: 0.4 %
Eosinophil #: 0 10*3/uL (ref 0.0–0.7)
Eosinophil %: 0.5 %
HCT: 46.2 % (ref 35.0–47.0)
HGB: 14.7 g/dL (ref 12.0–16.0)
Lymphocyte #: 1.4 10*3/uL (ref 1.0–3.6)
Lymphocyte %: 19.9 %
MCH: 27.9 pg (ref 26.0–34.0)
MCHC: 31.9 g/dL — ABNORMAL LOW (ref 32.0–36.0)
MCV: 87 fL (ref 80–100)
Monocyte #: 0.3 x10 3/mm (ref 0.2–0.9)
Monocyte %: 4.3 %
Neutrophil #: 5.4 10*3/uL (ref 1.4–6.5)
Neutrophil %: 74.9 %
Platelet: 194 10*3/uL (ref 150–440)
RBC: 5.28 10*6/uL — ABNORMAL HIGH (ref 3.80–5.20)
RDW: 14.4 % (ref 11.5–14.5)
WBC: 7.2 10*3/uL (ref 3.6–11.0)

## 2014-09-04 LAB — URINALYSIS, COMPLETE
Bacteria: NONE SEEN
Bilirubin,UR: NEGATIVE
Glucose,UR: NEGATIVE mg/dL (ref 0–75)
Ketone: NEGATIVE
Leukocyte Esterase: NEGATIVE
Nitrite: NEGATIVE
Ph: 5 (ref 4.5–8.0)
Protein: NEGATIVE
RBC,UR: 2 /HPF (ref 0–5)
Specific Gravity: 1.019 (ref 1.003–1.030)
Squamous Epithelial: 1
WBC UR: 3 /HPF (ref 0–5)

## 2014-12-22 NOTE — Discharge Summary (Signed)
PATIENT NAME:  Kelli Phillips, Kelli Phillips MR#:  [id number] DATE OF BIRTH:  1950/11/09  DATE OF ADMISSION:  03/21/2014 DATE OF DISCHARGE:  04/12/2014  BRIEF HISTORY: Kelli Phillips is a 64 year old woman with a recently discovered large rectal polyp. 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 She was admitted with the plan of pursuing a laparoscopic, robot-assisted, low anterior resection.   After appropriate preoperative preparation and informed consent, she was taken to surgery on the morning of 03/21/2014. She underwent a robotic-assisted low anterior resection. Because the anastomosis was so low in the abdomen, I elected to perform a diverting ileostomy in order to protect the anastomosis. The procedure was uncomplicated. She initially had no significant postoperative problems. However, she did not improve as expected. She demonstrated some symptoms of potential bowel obstruction. Work-up suggested an anastomotic problem at the ileostomy.   She was returned to surgery on July 31 where she underwent takedown of the ileostomy. She had some significant problems with chronic obstructive lung disease, postoperatively; however, symptoms began to improve over the next several days. However, she suffered a setback on August 6 requiring admission to the intensive care unit, where she was placed on a ventilator for pneumonia and chronic respiratory insufficiency. She slowly improved, was able to be extubated, moved to the floor, and finally advanced to a regular diet. Her final lung x-ray showed improved pulmonary function.   She was able to wean from O2 and was discharged home on the 13th to be followed in the office in 7 to 10 days' time. Bathing, activity and driving instructions were given to the patient.   DISCHARGE MEDICATIONS: She is to resume her home medications, which include prednisone 20 mg 2 tablets once a day, Ventolin 90 mcg 2 puffs a day, Vicodin 5/325 every 4 to 6 hours p.r.n. pain.    FINAL DISCHARGE DIAGNOSIS: Villous adenoma, rectum.   SURGERY:  1. Low anterior resection. 2. Small bowel obstruction at ostomy with ileostomy takedown.  3. Respiratory failure requiring intubation.   ____________________________ Rodena Goldmann III, MD rle:JT D: 05/03/2014 10:39:27 ET T: 05/03/2014 11:18:12 ET JOB#: [id number]  cc: Micheline Maze, MD, <Dictator> Mariane Duval, MD Arther Dames, MD Rodena Goldmann MD ELECTRONICALLY SIGNED 05/07/2014 19:35

## 2014-12-22 NOTE — Op Note (Signed)
PATIENT NAME:  Kelli Phillips, Kelli Phillips MR#:  [id number] DATE OF BIRTH:  1951/07/08  DATE OF PROCEDURE:  03/21/2014  PREOPERATIVE DIAGNOSIS: Rectal polyp, villous adenoma.   POSTOPERATIVE DIAGNOSIS: Rectal polyp, villous adenoma.   OPERATION: Robotic-assisted low anterior resection.   ANESTHESIA: General.   SURGEON: Micheline Maze, M.D.    ASSISTANT: Dr. Genevive Bi.  OPERATIVE PROCEDURE: With the patient in the supine position, after induction of appropriate general anesthesia, the patient was placed in lithotomy position, appropriately padded and positioned. The abdomen was prepped with Betadine and draped with sterile towels. The patient was placed in the head down, feet up position. A small right upper quadrant transverse incision was made, and using an Optiview cannula, the abdomen was cannulated without difficulty. Multiple adhesions were encountered from her previous lower abdominal surgeries. The right lower quadrant, right midabdomen, left lower quadrant, and left upper abdomen ports were all inserted under direct vision. Because of the adhesions, I could not get the robot initially docked, so we took the adhesions down with the standard laparoscopic instruments. Once the adhesions were removed from the anterior abdominal wall, the robot was brought to the table, docked, and the instruments inserted under direct vision.   I then moved to the console. The omentum was attached to the lateral pelvic side wall, left side. It was taken down with a combination of blunt and Bovie dissection. The distal sigmoid was grasped and retracted into the upper abdomen. The uterus was held up against the anterior abdominal wall with a 3rd arm. Dissection was then carried out around the rectum. Placing a dilator in the rectum allowed for better identification of the rectum and the dissection proceeded without difficulty. The black ink identifying the lower border of the mass was visualized, and the dissection carried  several centimeters below down to the levators. Once satisfactory exposure was obtained, the robot was undocked, trocars were removed. The midline incision was made from the umbilicus to the suprapubic area and carried down through the subcutaneous tissue with Bovie electrocautery. Midline fascia was identified and opened the length of the skin incision, as was the peritoneum. Bowel contents were packed in the upper abdomen. The distal sigmoid was divided with a single application of the Contour stapling device. The remainder of the mesentery was taken down with the LigaSure. The left ureter was easily identified. The bowel was then elevated into the incision and the distal rectum divided with the Contour stapling device. The bowel was passed off the table and opened. There was at least a 2 cm distal margin. The mass appeared to be completely encompassed. The proximal bowel was brought up into the wound and a pursestring placed across the distal staple line. The staple line was removed. The anvil for an EEA 25 stapling device was inserted into the bowel and secured with a pursestring. The body of the stapler was then inserted through the rectum. A spike driven through the posterior wall attached to the anvil without difficulty. The stapler was then very carefully approximated and fired. The stapler was removed without difficulty. The anastomosis was then tested with saline in the pelvis and an air bolus into the rectum. No leak was identified. Anterior staple line was oversewn with 3-0 silk seromuscular sutures. The peritoneum was then closed around the rectum and distal sigmoid. The abdomen was then copiously irrigated. Bowel contents were returned to their anatomic position.   The decision was made to create a proximal ileostomy diversion since the anastomosis was so  low and so distal. A right lower quadrant incision was made in a circular fashion and a cruciate incision made in the fascia. The peritoneum was  entered bluntly and a loop of small bowel approximately 15 to 18 cm from the ileocecal valve was elevated into the incision. It was secured in place. The midline fascia was closed with figure-of-8 buried sutures of 0 Maxon. The skin closed loosely over a Penrose drain. The remainder of the incisions were closed with staples. The colostomy was then matured using 3-0 Vicryl placed with a bridge and an ostomy bag. Sterile dressings were applied.   The patient was then returned to the recovery room having tolerated the procedure well.   Sponge, instrument and needle counts were correct x2 in the operating room.    ____________________________ Rodena Goldmann III, MD rle:jr D: 03/21/2014 11:56:26 ET T: 03/21/2014 13:51:08 ET JOB#: [id number]  cc: Rodena Goldmann III, MD, <Dictator> Rodena Goldmann MD ELECTRONICALLY SIGNED 03/23/2014 7:19

## 2014-12-22 NOTE — Consult Note (Signed)
Details:   - GI Note:  Appreciate the excellent care of Ms Deanda.  She seems to be progressing nicely.  Please call with any questions or concerns.   Electronic Signatures: Arther Dames (MD)  (Signed 23-Jul-15 15:54)  Authored: Details   Last Updated: 23-Jul-15 15:54 by Arther Dames (MD)

## 2014-12-22 NOTE — Op Note (Signed)
PATIENT NAME:  Kelli Phillips, Kelli Phillips MR#:  [id number] DATE OF BIRTH:  1950-09-11  DATE OF PROCEDURE:  03/30/2014  PREOPERATIVE DIAGNOSIS: Small bowel obstruction secondary to diverting loop ileostomy.   POSTOPERATIVE DIAGNOSIS: Small bowel obstruction secondary to diverting loop ileostomy.   PROCEDURE: Ileostomy takedown.   SURGEON: Consuela Mimes, M.D.   FIRST ASSISTANT: Dr. Nestor Lewandowsky  ANESTHESIA: General.   PROCEDURE IN DETAIL: The patient was placed supine on the operating room table and prepped and draped in the usual sterile fashion. The functional end of the ostomy was sutured closed and an elliptical incision was made surrounding the ostomy in a paramedian fashion and the sutures that had matured the ostomy were removed and the plane between the bowel wall and the subcutaneous tissue was entered. I did excise a little bit of subcutaneous tissue superiorly and inferiorly so that the wound would close nicely. I was then able to get my finger beside the bowel wall into the peritoneum, and I opened the anterior rectus sheath, rectus muscle and posterior rectus sheath superiorly and inferiorly, again in a paramedian position parallel to the midline. The ostomy had a very tight area in its proximal limb that was constricted but was quite viable. I then resected all of the small intestine that had been outside the fascia by taking down the small bowel mesentery with the Harmonic scalpel and dividing the proximal and distal limbs of the intestine with the GIA stapling device. I then introduced a sump suction into the proximal limb after removing the antimesenteric portion of the staple line in order to decompress it and I also removed the antimesenteric portion of the staple line on the distal limb and performed a side-to-side anastomosis with the GIA stapling device on the antimesenteric surface of both limbs. I closed the resulting enterotomy with a TA-60 stapler. I placed a 3-0 silk seromuscular  Lembert suture at the apex of the GIA staple line and closed the mesenteric defect with a running 3-0 chromic suture. Hemostasis was excellent as was blood supply and the anastomosis was widely patent. I then placed this back in the peritoneal cavity and closed the posterior rectus sheath with a running #1 PDS suture. I then closed the cruciate portion of the anterior rectus sheath with 2 horizontal mattress sutures of #1 PDS and closed the remaining longitudinal anterior rectus sheath with a running #1 PDS suture. I irrigated the wound and closed the skin with staples leaving 2 staples out in the center of the wound, and I placed a saline-soaked Telfa wick within that and covered this with a sterile dressing completing the procedure. The patient tolerated the procedure well. There were no complications.  ____________________________ Consuela Mimes, MD wfm:sb D: 03/30/2014 16:17:35 ET T: 03/30/2014 16:30:19 ET JOB#: [id number]  cc: Consuela Mimes, MD, <Dictator> Consuela Mimes MD ELECTRONICALLY SIGNED 03/31/2014 9:35

## 2014-12-22 NOTE — Consult Note (Signed)
PATIENT NAME:  Kelli Phillips, Kelli Phillips MR#:  [id number] DATE OF BIRTH:  02-01-1951  DATE OF CONSULTATION:  03/21/2014  REFERRING PHYSICIAN:  Dr. Pat Patrick.   CONSULTING PHYSICIAN:  Tiye Huwe R. Pritesh Sobecki, MD  REASON FOR CONSULTATION: COPD.   HISTORY OF PRESENT ILLNESS: A 64 year old Caucasian female patient with recent GI bleed, thought to be likely secondary from hemorrhoids.  She had a colonoscopy which showed colon polyps and presently is here admitted to the hospital after colon resection due to the polyp. She did lose about 100 mL of blood during the surgery.   She has had recent flare-ups of her COPD on-and-off twice and has been on steroids, prednisone, over 2 months with her last dose of 10 mg of prednisone yesterday that she finished.  She does tend to use albuterol most days. She has not been on any Advair or Spiriva in the past. She used to smoke in the past but quit many years back. Not on home oxygen.   PAST MEDICAL HISTORY:  1.  Hemorrhoids.  2.  Vaginal cysts.   3.  COPD. 4.  Skin cancer.  5.  Ruptured spleen.  6.  Migraines.  7.  C-sections.  8.  Ovarian cyst.   ALLERGIES: PENICILLIN AND SULFA.   SOCIAL HISTORY: The patient used to smoke in the past but quit many years back. No alcohol. No illicit drugs. Works at a [employer]in [employer].  CODE STATUS: Full Code.   HOME MEDICATIONS:  1.  Ventolin 2 puffs inhaled 4 times a day as needed for shortness of breath and wheezing.  2.  Prednisone 10 mg daily, finished yesterday.   REVIEW OF SYSTEMS: CONSTITUTIONAL: Complains of some fatigue.  EYES:  No pain, redness.  ENT:  No drainage, no hearing loss.  RESPIRATORY: No cough, wheezing.  Has COPD.  CARDIOVASCULAR:  has chronic chest pain, orthopnea, edema.  GASTROINTESTINAL: Has some mild belly pain, had surgery.  GASTROINTESTINAL: No dysuria, hematuria, frequency.  ENDOCRINE: No polyuria, nocturia, thyroid problems.  HEMATOLOGIC AND LYMPHATIC: No anemia, easy bruising, bleeding.  INTEGUMENTARY:  No acne, rash, lesion.  MUSCULOSKELETAL: No back pain, arthritis.  NEUROLOGIC: No focal numbness, weakness, seizure.  PSYCHIATRIC: No anxiety or depression.   FAMILY HISTORY: No history of colon cancer in the family.   PHYSICAL EXAMINATION:  VITAL SIGNS: Temperature 97, pulse 71, blood pressure 114/68, saturating 99% on 2 liters oxygen.  GENERAL: Moderately built Caucasian female patient lying in bed, seems comfortable, conversational, cooperative with examination.  PSYCHIATRIC: Alert and oriented x 3. Mood and affect appropriate. Judgment intact.  HEENT:atrumatic.  Mucosa moist and pink. External ears and nose normal. No pallor. No icterus. Pupils bilaterally reactive to light.  NECK: Supple. No thyromegaly or palpable lymph nodes. Trachea midline. No carotid bruit or JVD.  CARDIOVASCULAR: S1 and S2 without any murmurs. Peripheral pulses 2+, no edema.  RESPIRATORY: Normal work of breathing. Clear to auscultation on both sides.  GASTROINTESTINAL: Soft abdomen. Tenderness at the surgical site, has a colostomy bag. Bowel sounds decreased.  GENITOURINARY: No significant bladder distention.  SKIN: Warm and dry. No petechiae, rash.  NEUROLOGICAL: Motor strength 5/5 in upper and lower extremities. No cervical lymphadenopathy.  LABORATORY DATA:  Studies done recently had been reviewed with creatinine today of 1.03, recently at 0.75. Hemoglobin was 13.9 on 03/12/2014.   Chest x-ray, portable, done today shows marked subcutaneous emphysema, intraabdominal free air postsurgery. No acute pulmonary disease.   ASSESSMENT AND PLAN:  1.  Chronic obstructive pulmonary disease.  The patient  has had recent flares of chronic obstructive pulmonary disease, has been on prednisone on and off. She did finish her prednisone yesterday, she is not wheezing at this time. She can continue albuterol q.4 hours as needed but we will add daily Symbicort and Spiriva to decrease the chronic obstructive pulmonary exacerbations  the patient has had. She has quit smoking, which I have appreciated with her. We will add incentive spirometer to prevent postoperative atelectasis and pneumonia.  2.  Colon polyps, status post colectomy. Further management per surgery,  3.  Deep vein thrombosis prophylaxis. The patient is on Lovenox.  4.  We will order CBC profile tomorrow to monitor her hemoglobin for any acute blood loss anemia.   Time spent today on this consult was 35 minutes.    ____________________________ Leia Alf Luca Dyar, MD srs:lt D: 03/21/2014 15:30:20 ET T: 03/21/2014 15:56:20 ET JOB#: [id number]  cc: Alveta Heimlich R. Darvin Neighbours, MD, <Dictator> Neita Carp MD ELECTRONICALLY SIGNED 04/16/2014 14:13

## 2015-01-21 ENCOUNTER — Emergency Department: Payer: Medicare Other

## 2015-01-21 ENCOUNTER — Emergency Department
Admission: EM | Admit: 2015-01-21 | Discharge: 2015-01-21 | Disposition: A | Discharge status: Home / Self Care | Payer: Medicare Other | Attending: Emergency Medicine | Admitting: Emergency Medicine

## 2015-01-21 DIAGNOSIS — X58XXXA Exposure to other specified factors, initial encounter: Secondary | ICD-10-CM | POA: Diagnosis not present

## 2015-01-21 DIAGNOSIS — Y9289 Other specified places as the place of occurrence of the external cause: Secondary | ICD-10-CM | POA: Insufficient documentation

## 2015-01-21 DIAGNOSIS — Z880 Allergy status to penicillin: Secondary | ICD-10-CM | POA: Diagnosis not present

## 2015-01-21 DIAGNOSIS — Y93G9 Activity, other involving cooking and grilling: Secondary | ICD-10-CM | POA: Insufficient documentation

## 2015-01-21 DIAGNOSIS — Y990 Civilian activity done for income or pay: Secondary | ICD-10-CM | POA: Insufficient documentation

## 2015-01-21 DIAGNOSIS — Z87891 Personal history of nicotine dependence: Secondary | ICD-10-CM | POA: Diagnosis not present

## 2015-01-21 DIAGNOSIS — M25511 Pain in right shoulder: Secondary | ICD-10-CM | POA: Diagnosis present

## 2015-01-21 DIAGNOSIS — S46911A Strain of unspecified muscle, fascia and tendon at shoulder and upper arm level, right arm, initial encounter: Secondary | ICD-10-CM | POA: Diagnosis not present

## 2015-01-21 HISTORY — DX: Chronic obstructive pulmonary disease, unspecified: J44.9

## 2015-01-21 HISTORY — DX: Malignant (primary) neoplasm, unspecified: C80.1

## 2015-01-21 MED ORDER — KETOROLAC TROMETHAMINE 30 MG/ML IJ SOLN
INTRAMUSCULAR | Status: AC
  Administered 2015-01-21: 30 mg via INTRAMUSCULAR
  Filled 2015-01-21: qty 1

## 2015-01-21 MED ORDER — OXYCODONE-ACETAMINOPHEN 5-325 MG PO TABS: 1.0000 | ORAL_TABLET | ORAL | Status: DC | PRN

## 2015-01-21 MED ORDER — KETOROLAC TROMETHAMINE 30 MG/ML IJ SOLN
30.0000 mg | Freq: Once | INTRAMUSCULAR | Status: AC
  Administered 2015-01-21: 30 mg via INTRAMUSCULAR

## 2015-01-21 MED ORDER — IBUPROFEN 800 MG PO TABS: 800.0000 mg | ORAL_TABLET | Freq: Three times a day (TID) | ORAL | Status: DC | PRN

## 2015-01-21 NOTE — ED Provider Notes (Signed)
[facility] Emergency Department Provider Note  ____________________________________________  Time seen: Approximately 12:55 PM  I have reviewed the triage vital signs and the nursing notes.   HISTORY  Chief Complaint Arm Pain    HPI Kelli Phillips is a 64 y.o. female presents for evaluation of right shoulder pain 2 days. 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 Last night she went to put her on the floor and felt a sudden pain in her shoulder. Denies any other direct trauma. Rates pain as 10 over 10   Past Medical History  Diagnosis Date  . COPD (chronic obstructive pulmonary disease)   . Cancer     There are no active problems to display for this patient.   Past Surgical History  Procedure Laterality Date  . Colon surgery    . Cesarean section      Current Outpatient Rx  Name  Route  Sig  Dispense  Refill  . ibuprofen (ADVIL,MOTRIN) 800 MG tablet   Oral   Take 1 tablet (800 mg total) by mouth every 8 (eight) hours as needed.   30 tablet   0   . oxyCODONE-acetaminophen (ROXICET) 5-325 MG per tablet   Oral   Take 1-2 tablets by mouth every 4 (four) hours as needed for severe pain.   15 tablet   0     Allergies Penicillins and Sulfa antibiotics  No family history on file.  Social History History  Substance Use Topics  . Smoking status: Former Research scientist (life sciences)  . Smokeless tobacco: Never Used  . Alcohol Use: No    Review of Systems Constitutional: No fever/chills Eyes: No visual changes. ENT: No sore throat. Cardiovascular: Denies chest pain. Respiratory: Denies shortness of breath. Gastrointestinal: No abdominal pain.  No nausea, no vomiting.  No diarrhea.  No constipation. Genitourinary: Negative for dysuria. Musculoskeletal: Negative for back pain. Positive for right shoulder pain. Skin: Negative for rash. Neurological: Negative for headaches, focal weakness or  numbness.  10-point ROS otherwise negative.  ____________________________________________   PHYSICAL EXAM:  VITAL SIGNS: ED Triage Vitals  Enc Vitals Group     BP 01/21/15 1208 114/98 mmHg     Pulse Rate 01/21/15 1208 65     Resp 01/21/15 1208 18     Temp 01/21/15 1208 97.8 F (36.6 C)     Temp Source 01/21/15 1208 Oral     SpO2 01/21/15 1208 96 %     Weight 01/21/15 1208 129 lb (58.514 kg)     Height 01/21/15 1208 5\' 5"  (1.651 m)     Head Cir --      Peak Flow --      Pain Score 01/21/15 1208 10     Pain Loc --      Pain Edu? --      Excl. in [place]? --     Constitutional: Alert and oriented. Well appearing and in no acute distress. Cardiovascular: Normal rate, regular rhythm. Grossly normal heart sounds.  Good peripheral circulation. Respiratory: Normal respiratory effort.  No retractions. Lungs CTAB. Musculoskeletal: No lower extremity tenderness nor edema.  No joint effusions. Limited range of motion right shoulder. Held in abduction. Increased pain with abduction and extension. Neurologic:  Normal speech and language. No gross focal neurologic deficits are appreciated. Speech is normal. No gait instability. Skin:  Skin is warm, dry and intact. No rash noted. Psychiatric: Mood and affect are normal. Speech and behavior are  normal.  ____________________________________________   LABS (all labs ordered are listed, but only abnormal results are displayed)  Labs Reviewed - No data to display ____________________________________________  EKG  None ____________________________________________  RADIOLOGY  Interpreted by radiology, reviewed by myself. Negative. ____________________________________________   PROCEDURES  Procedure(s) performed: None  Critical Care performed: No  ____________________________________________   INITIAL IMPRESSION / ASSESSMENT AND PLAN / ED COURSE  Pertinent labs & imaging results that were available during my care of the patient  were reviewed by me and considered in my medical decision making (see chart for details).  Acute right shoulder strain probably due to overuse syndrome. Patient refuses all muscle relaxers will treat with Motrin 800 mg 3 times a day. Sling for comfort. And follow up with orthopedics as needed. No other PCE at this time. She will return with worsening of symptoms. ____________________________________________   FINAL CLINICAL IMPRESSION(S) / ED DIAGNOSES  Final diagnoses:  Shoulder strain, right, initial encounter      Arlyss Repress, PA-C 01/21/15 1556  Lavonia Drafts, MD 01/23/15 1256

## 2015-01-21 NOTE — Discharge Instructions (Signed)
Muscle Strain A muscle strain is an injury that occurs when a muscle is stretched beyond its normal length. Usually a small number of muscle fibers are torn when this happens. Muscle strain is rated in degrees. First-degree strains have the least amount of muscle fiber tearing and pain. Second-degree and third-degree strains have increasingly more tearing and pain.  Usually, recovery from muscle strain takes 1-2 weeks. Complete healing takes 5-6 weeks.  CAUSES  Muscle strain happens when a sudden, violent force placed on a muscle stretches it too far. This may occur with lifting, sports, or a fall.  RISK FACTORS Muscle strain is especially common in athletes.  SIGNS AND SYMPTOMS At the site of the muscle strain, there may be:  Pain.  Bruising.  Swelling.  Difficulty using the muscle due to pain or lack of normal function. DIAGNOSIS  Your health care provider will perform a physical exam and ask about your medical history. TREATMENT  Often, the best treatment for a muscle strain is resting, icing, and applying cold compresses to the injured area.  HOME CARE INSTRUCTIONS   Use the PRICE method of treatment to promote muscle healing during the first 2-3 days after your injury. The PRICE method involves:  Protecting the muscle from being injured again.  Restricting your activity and resting the injured body part.  Icing your injury. To do this, put ice in a plastic bag. Place a towel between your skin and the bag. Then, apply the ice and leave it on from 15-20 minutes each hour. After the third day, switch to moist heat packs.  Apply compression to the injured area with a splint or elastic bandage. Be careful not to wrap it too tightly. This may interfere with blood circulation or increase swelling.  Elevate the injured body part above the level of your heart as often as you can.  Only take over-the-counter or prescription medicines for pain, discomfort, or fever as directed by your  health care provider.  Warming up prior to exercise helps to prevent future muscle strains. SEEK MEDICAL CARE IF:   You have increasing pain or swelling in the injured area.  You have numbness, tingling, or a significant loss of strength in the injured area. MAKE SURE YOU:   Understand these instructions.  Will watch your condition.  Will get help right away if you are not doing well or get worse. Document Released: 08/17/2005 Document Revised: 06/07/2013 Document Reviewed: 03/16/2013 [facility] Patient Information 2015 [place], [place]. This information is not intended to replace advice given to you by your health care provider. Make sure you discuss any questions you have with your health care provider.  Shoulder Sprain A shoulder sprain is the result of damage to the tough, fiber-like tissues (ligaments) that help hold your shoulder in place. The ligaments may be stretched or torn. Besides the main shoulder joint (the ball and socket), there are several smaller joints that connect the bones in this area. A sprain usually involves one of those joints. Most often it is the acromioclavicular (or AC) joint. That is the joint that connects the collarbone (clavicle) and the shoulder blade (scapula) at the top point of the shoulder blade (acromion). A shoulder sprain is a mild form of what is called a shoulder separation. Recovering from a shoulder sprain may take some time. For some, pain lingers for several months. Most people recover without long term problems. CAUSES   A shoulder sprain is usually caused by some kind of trauma. This might be:  Falling on an outstretched arm.  Being hit hard on the shoulder.  Twisting the arm.  Shoulder sprains are more likely to occur in people who:  Play sports.  Have balance or coordination problems. SYMPTOMS   Pain when you move your shoulder.  Limited ability to move the shoulder.  Swelling and tenderness on top of the shoulder.  Redness  or warmth in the shoulder.  Bruising.  A change in the shape of the shoulder. DIAGNOSIS  Your healthcare provider may:  Ask about your symptoms.  Ask about recent activity that might have caused those symptoms.  Examine your shoulder. You may be asked to do simple exercises to test movement. The other shoulder will be examined for comparison.  Order some tests that provide a look inside the body. They can show the extent of the injury. The tests could include:  X-rays.  CT (computed tomography) scan.  MRI (magnetic resonance imaging) scan. RISKS AND COMPLICATIONS  Loss of full shoulder motion.  Ongoing shoulder pain. TREATMENT  How long it takes to recover from a shoulder sprain depends on how severe it was. Treatment options may include:  Rest. You should not use the arm or shoulder until it heals.  Ice. For 2 or 3 days after the injury, put an ice pack on the shoulder up to 4 times a day. It should stay on for 15 to 20 minutes each time. Wrap the ice in a towel so it does not touch your skin.  Over-the-counter medicine to relieve pain.  A sling or brace. This will keep the arm still while the shoulder is healing.  Physical therapy or rehabilitation exercises. These will help you regain strength and motion. Ask your healthcare provider when it is OK to begin these exercises.  Surgery. The need for surgery is rare with a sprained shoulder, but some people may need surgery to keep the joint in place and reduce pain. HOME CARE INSTRUCTIONS   Ask your healthcare provider about what you should and should not do while your shoulder heals.  Make sure you know how to apply ice to the correct area of your shoulder.  Talk with your healthcare provider about which medications should be used for pain and swelling.  If rehabilitation therapy will be needed, ask your healthcare provider to refer you to a therapist. If it is not recommended, then ask about at-home exercises. Find  out when exercise should begin. SEEK MEDICAL CARE IF:  Your pain, swelling, or redness at the joint increases. SEEK IMMEDIATE MEDICAL CARE IF:   You have a fever.  You cannot move your arm or shoulder. Document Released: 01/03/2009 Document Revised: 11/09/2011 Document Reviewed: 01/03/2009 [facility] Patient Information 2015 [place], [place]. This information is not intended to replace advice given to you by your health care provider. Make sure you discuss any questions you have with your health care provider.

## 2015-01-21 NOTE — ED Notes (Signed)
Pt c/o right shoulder/upper arm pain..states she just started working at [employer]

## 2015-09-09 IMAGING — CT CT ABD-PELV W/ CM
2 of 5 series · 15 of 46 positions shown, 17 images · IV contrast (agent unspecified)
Comparison: 02/27/2014

CLINICAL DATA: Recent rectal mass resection and ileostomy.
Abdominal distention and no drainage from the ileostomy.

EXAM:
CT ABDOMEN AND PELVIS WITH CONTRAST
TECHNIQUE: Multidetector CT imaging of the abdomen and pelvis was performed
using the standard protocol following bolus administration of
intravenous contrast.
CONTRAST:  100 cc Fsovue-TXX

[Series 2: routine abd pel with · axial · 0.67mm/px · z∈[-1008,-668]mm · 12 of 82 slices shown, 14 images]
[im 7/82  soft-tissue]
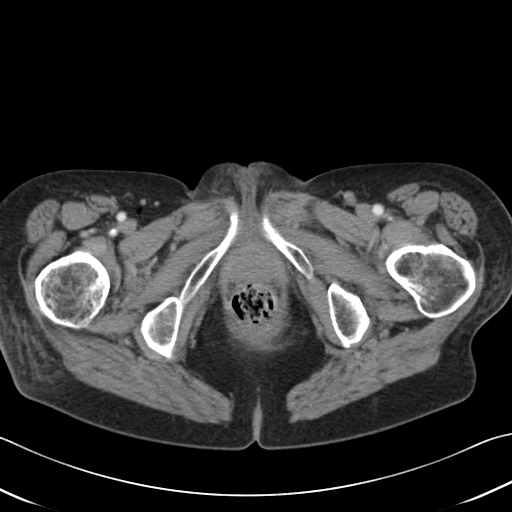
[im 7/82  bone]
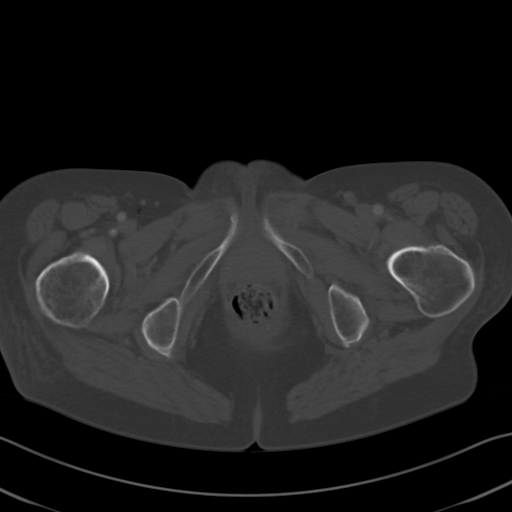
[im 13/82  soft-tissue]
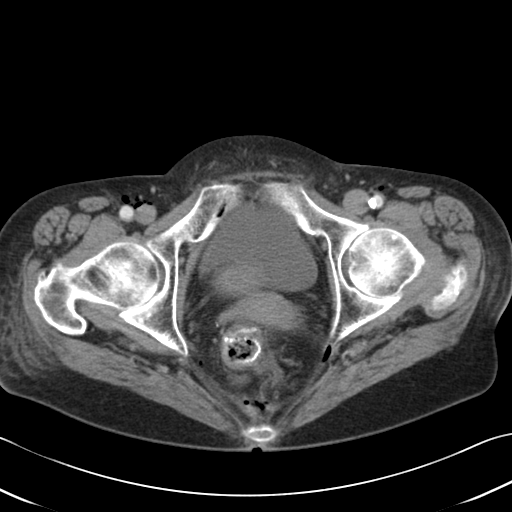
[im 19/82  soft-tissue]
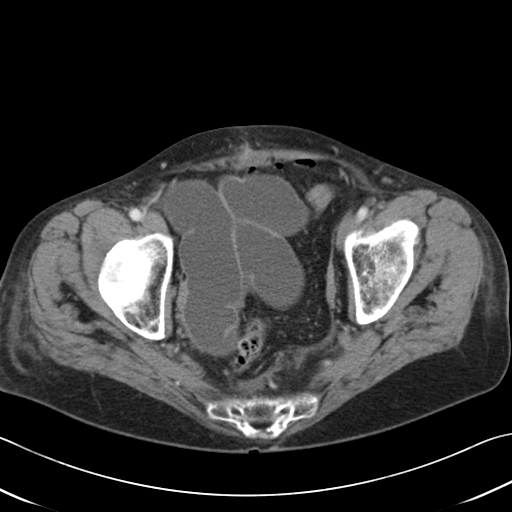
[im 25/82  soft-tissue]
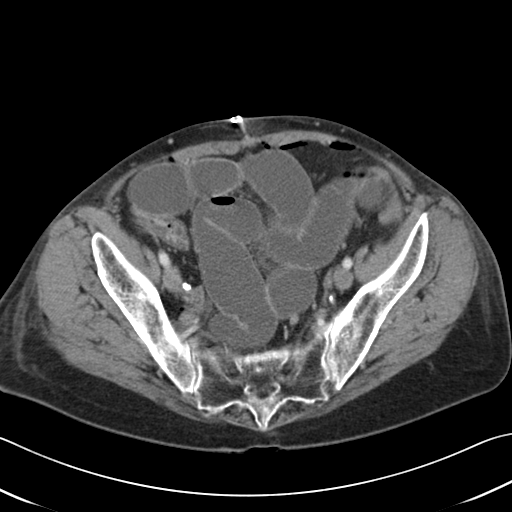
[im 32/82  soft-tissue]
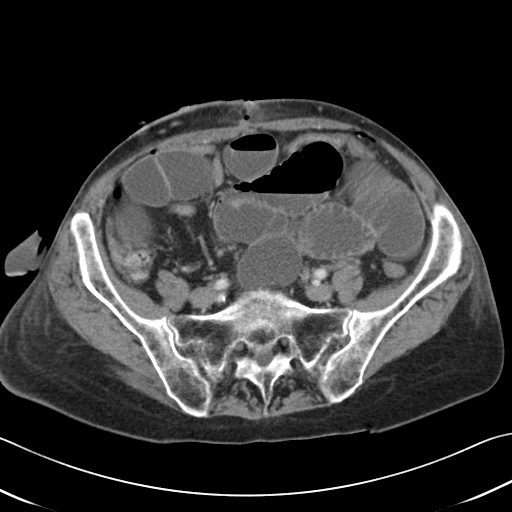
[im 38/82  soft-tissue]
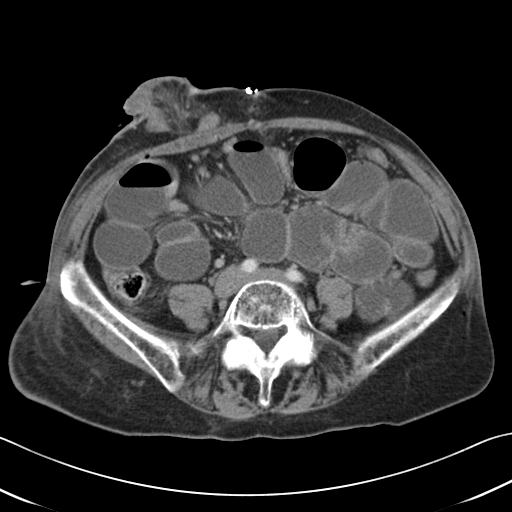
[im 44/82  soft-tissue]
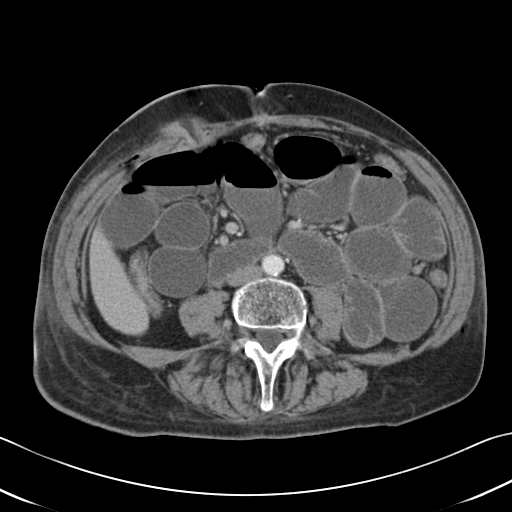
[im 50/82  soft-tissue]
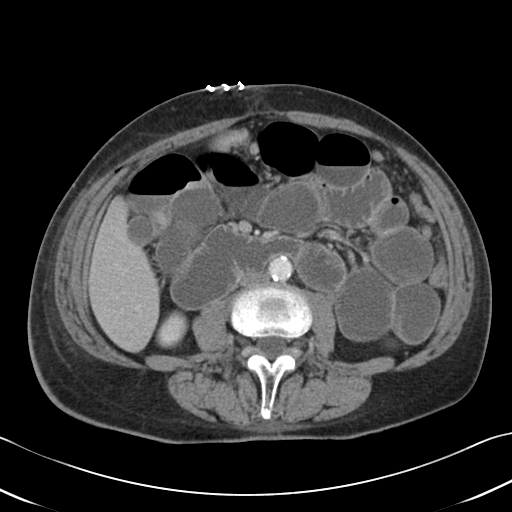
[im 57/82  soft-tissue]
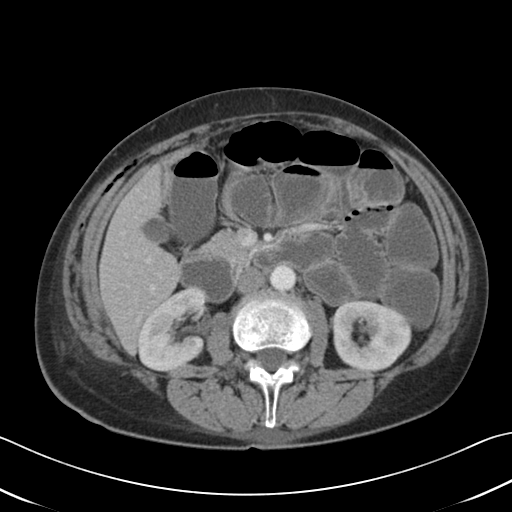
[im 57/82  bone]
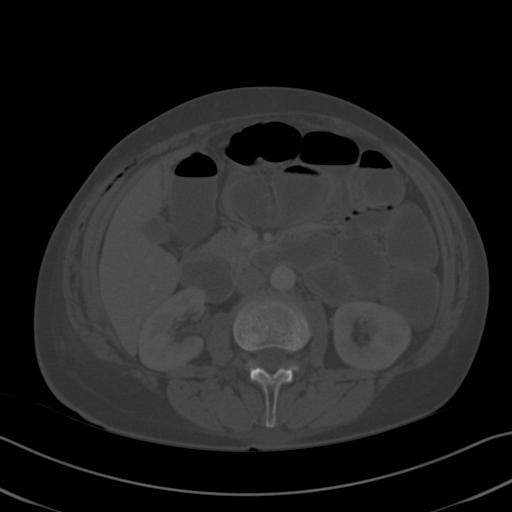
[im 63/82  soft-tissue]
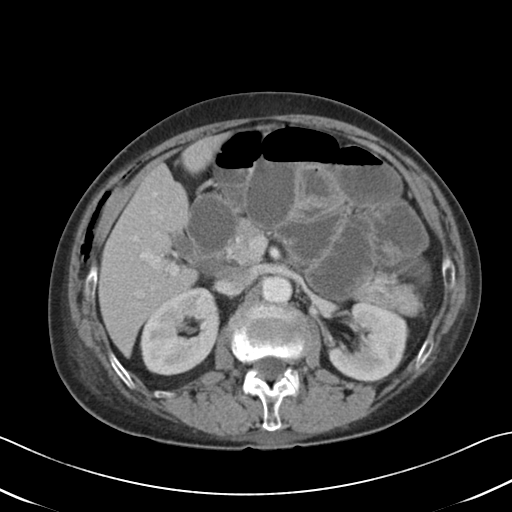
[im 69/82  soft-tissue]
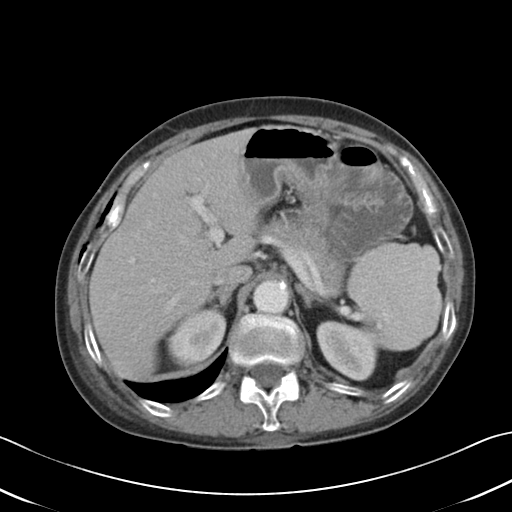
[im 75/82  soft-tissue]
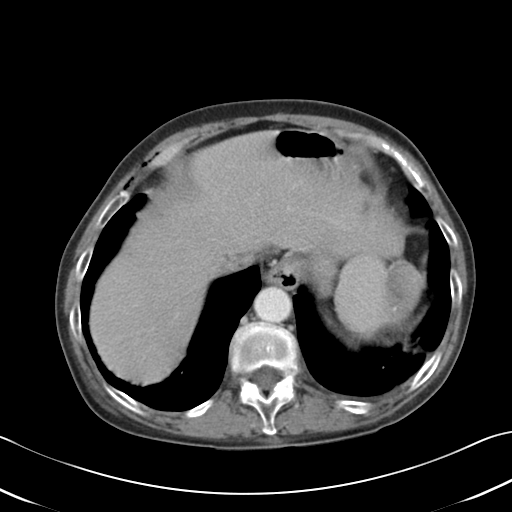

[Series 6: cor routine abd pel with · coronal · 0.65mm/px · 3 of 121 slices shown]
[im 41/121  soft-tissue]
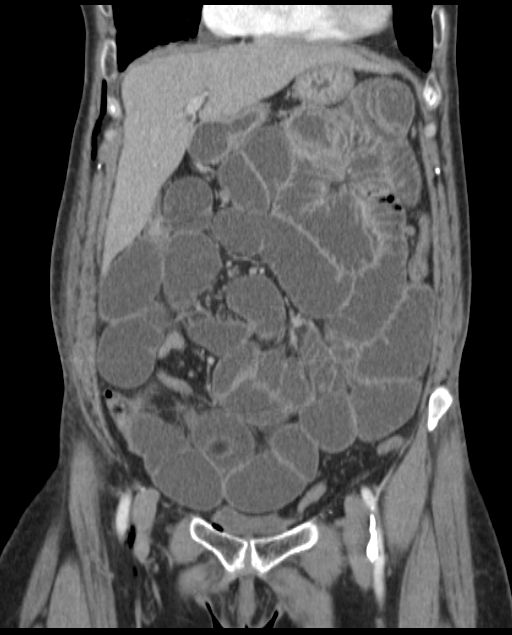
[im 54/121  soft-tissue]
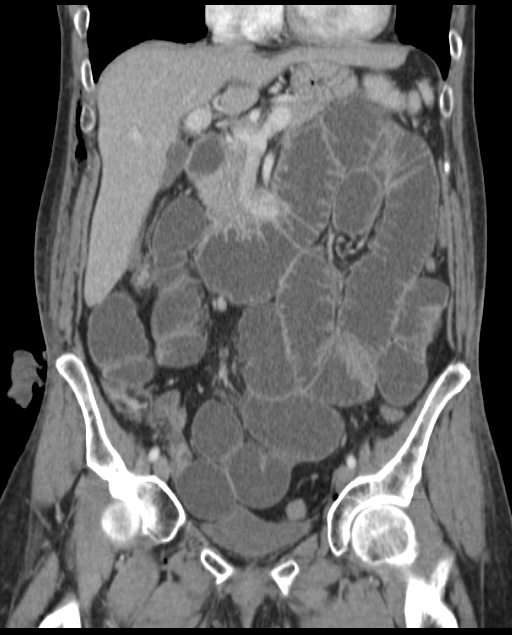
[im 67/121  soft-tissue]
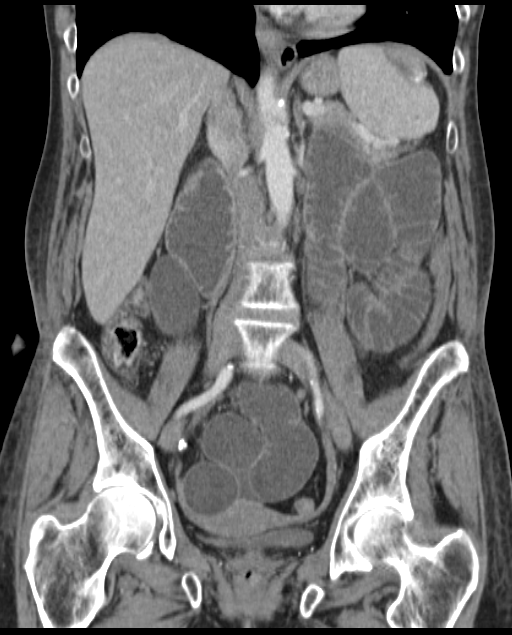

[15 of 46 positions shown; findings below may reference images not displayed]

FINDINGS: The lung bases are clear of acute process. Minimal dependent left
lower lobe atelectasis. The heart is normal in size. No pericardial
effusion.

The liver is unremarkable and stable. No focal hepatic lesions or
intrahepatic biliary dilatation. Stable abnormal morphology of the
spleen possibly due to previous trauma with a partially calcified
perisplenic hematoma. The pancreas is unremarkable. The adrenal
glands are stable. No significant renal abnormalities. Stable left
lower pole renal cyst.

The stomach and duodenum are unremarkable. There is fairly extensive
small bowel dilatation with air-fluid levels consistent with
obstruction. This courses right down to the ileostomy. There is
probably a double-barrel or side wall ileostomy. The portion of the
small bowel exiting the ileostomy is decompressed and this extends
right to the terminal ileum. The colon is completely decompressed.

Expected postoperative findings with a small amount of pelvic fluid
and some residual free air in the abdomen and pelvis. I do not see
any findings for postoperative abscess or bowel perforation.
IMPRESSION: High-grade Obstruction of the small bowel  at the ileostomy site.

Postoperative changes related to a rectal mass dissection with
expected free air and fluid in the abdomen and pelvis.

## 2016-02-06 ENCOUNTER — Encounter: Payer: Self-pay | Admitting: Emergency Medicine

## 2016-02-06 ENCOUNTER — Emergency Department
Admission: EM | Admit: 2016-02-06 | Discharge: 2016-02-06 | Disposition: A | Discharge status: Home / Self Care | Payer: Medicare Other | Attending: Emergency Medicine | Admitting: Emergency Medicine

## 2016-02-06 ENCOUNTER — Emergency Department: Payer: Medicare Other

## 2016-02-06 DIAGNOSIS — S20212A Contusion of left front wall of thorax, initial encounter: Secondary | ICD-10-CM | POA: Diagnosis not present

## 2016-02-06 DIAGNOSIS — J449 Chronic obstructive pulmonary disease, unspecified: Secondary | ICD-10-CM | POA: Insufficient documentation

## 2016-02-06 DIAGNOSIS — Y9389 Activity, other specified: Secondary | ICD-10-CM | POA: Diagnosis not present

## 2016-02-06 DIAGNOSIS — Y999 Unspecified external cause status: Secondary | ICD-10-CM | POA: Diagnosis not present

## 2016-02-06 DIAGNOSIS — S29001A Unspecified injury of muscle and tendon of front wall of thorax, initial encounter: Secondary | ICD-10-CM | POA: Diagnosis present

## 2016-02-06 DIAGNOSIS — Z87891 Personal history of nicotine dependence: Secondary | ICD-10-CM | POA: Insufficient documentation

## 2016-02-06 DIAGNOSIS — X501XXA Overexertion from prolonged static or awkward postures, initial encounter: Secondary | ICD-10-CM | POA: Diagnosis not present

## 2016-02-06 DIAGNOSIS — Z859 Personal history of malignant neoplasm, unspecified: Secondary | ICD-10-CM | POA: Diagnosis not present

## 2016-02-06 DIAGNOSIS — Y92019 Unspecified place in single-family (private) house as the place of occurrence of the external cause: Secondary | ICD-10-CM | POA: Diagnosis not present

## 2016-02-06 MED ORDER — KETOROLAC TROMETHAMINE 30 MG/ML IJ SOLN
30.0000 mg | Freq: Once | INTRAMUSCULAR | Status: AC
  Administered 2016-02-06: 30 mg via INTRAMUSCULAR
  Filled 2016-02-06: qty 1

## 2016-02-06 MED ORDER — NAPROXEN 500 MG PO TABS: 500.0000 mg | ORAL_TABLET | Freq: Two times a day (BID) | ORAL | Status: DC

## 2016-02-06 NOTE — Discharge Instructions (Signed)
Chest Contusion A chest contusion is a deep bruise on your chest area. Contusions are the result of an injury that caused bleeding under the skin. A chest contusion may involve bruising of the skin, muscles, or ribs. The contusion may turn blue, purple, or yellow. Minor injuries will give you a painless contusion, but more severe contusions may stay painful and swollen for a few weeks. CAUSES  A contusion is usually caused by a blow, trauma, or direct force to an area of the body. SYMPTOMS   Swelling and redness of the injured area.  Discoloration of the injured area.  Tenderness and soreness of the injured area.  Pain. DIAGNOSIS  The diagnosis can be made by taking a history and performing a physical exam. An X-ray, CT scan, or MRI may be needed to determine if there were any associated injuries, such as broken bones (fractures) or internal injuries. TREATMENT  Often, the best treatment for a chest contusion is resting, icing, and applying cold compresses to the injured area. Deep breathing exercises may be recommended to reduce the risk of pneumonia. Over-the-counter medicines may also be recommended for pain control. HOME CARE INSTRUCTIONS   Put ice on the injured area.  Put ice in a plastic bag.  Place a towel between your skin and the bag.  Leave the ice on for 15-20 minutes, 03-04 times a day.  Only take over-the-counter or prescription medicines as directed by your caregiver. Your caregiver may recommend avoiding anti-inflammatory medicines (aspirin, ibuprofen, and naproxen) for 48 hours because these medicines may increase bruising.  Rest the injured area.  Perform deep-breathing exercises as directed by your caregiver.  Stop smoking if you smoke.  Do not lift objects over 5 pounds (2.3 kg) for 3 days or longer if recommended by your caregiver. SEEK IMMEDIATE MEDICAL CARE IF:   You have increased bruising or swelling.  You have pain that is getting worse.  You have  difficulty breathing.  You have dizziness, weakness, or fainting.  You have blood in your urine or stool.  You cough up or vomit blood.  Your swelling or pain is not relieved with medicines. MAKE SURE YOU:   Understand these instructions.  Will watch your condition.  Will get help right away if you are not doing well or get worse.   This information is not intended to replace advice given to you by your health care provider. Make sure you discuss any questions you have with your health care provider.   Document Released: 05/12/2001 Document Revised: 05/11/2012 Document Reviewed: 02/08/2012 Elsevier Interactive Patient Education 2016 [employer].  Cryotherapy Cryotherapy is when you put ice on your injury. Ice helps lessen pain and puffiness (swelling) after an injury. Ice works the best when you start using it in the first 24 to 48 hours after an injury. HOME CARE  Put a dry or damp towel between the ice pack and your skin.  You may press gently on the ice pack.  Leave the ice on for no more than 10 to 20 minutes at a time.  Check your skin after 5 minutes to make sure your skin is okay.  Rest at least 20 minutes between ice pack uses.  Stop using ice when your skin loses feeling (numbness).  Do not use ice on someone who cannot tell you when it hurts. This includes small children and people with memory problems (dementia). GET HELP RIGHT AWAY IF:  You have white spots on your skin.  Your skin  turns blue or pale.  Your skin feels waxy or hard.  Your puffiness gets worse. MAKE SURE YOU:   Understand these instructions.  Will watch your condition.  Will get help right away if you are not doing well or get worse.   This information is not intended to replace advice given to you by your health care provider. Make sure you discuss any questions you have with your health care provider.   Document Released: 02/03/2008 Document Revised: 11/09/2011 Document  Reviewed: 04/09/2011 Elsevier Interactive Patient Education [employer].

## 2016-02-06 NOTE — ED Provider Notes (Signed)
[facility] Emergency Department Provider Note  ____________________________________________  Time seen: Approximately 6:13 PM  I have reviewed the triage vital signs and the nursing notes.   HISTORY  Chief Complaint axilla / breast pain     HPI Kelli Phillips is a 65 y.o. female , NAD, presents to the emergency department with one-day history of left lateral chest/breast pain. States she was cleaning in the laundry area of her home. Noted a large bucket was trying to kill with a broom. States she reached over her washing machine and felt a pop about her left lateral breast and chest area. States she has had pain about the area since that time. Denies any bruising, open wounds, lacerations, skin sores, redness, swelling. Has taken Advil with no relief of pain. Has not completed any other supportive care at home. Denies any chest pain, shortness breath, wheezing, visual changes. Has had full range of motion of the left upper extremity since the incident. Denies any other falls or injuries. Does note she has a history of COPD but has had no symptoms of such recently.   Past Medical History  Diagnosis Date  . COPD (chronic obstructive pulmonary disease) ([place])   . Cancer ([place])     There are no active problems to display for this patient.   Past Surgical History  Procedure Laterality Date  . Colon surgery    . Cesarean section      Current Outpatient Rx  Name  Route  Sig  Dispense  Refill  . naproxen (NAPROSYN) 500 MG tablet   Oral   Take 1 tablet (500 mg total) by mouth 2 (two) times daily with a meal.   14 tablet   0     Allergies Penicillins and Sulfa antibiotics  No family history on file.  Social History Social History  Substance Use Topics  . Smoking status: Former Research scientist (life sciences)  . Smokeless tobacco: Never Used  . Alcohol Use: No     Review of Systems  Constitutional: No fever/chills Eyes: No visual changes. Cardiovascular: No chest  pain. Respiratory: No cough. No shortness of breath. No wheezing.  Gastrointestinal: No abdominal pain.  No nausea, vomiting.   Musculoskeletal: Positive left lateral chest wall pain. Negative for back, neck, shoulder pain.  Skin: Negative for rash, bruising, redness, swelling, or open wounds. Neurological: Negative for headaches, focal weakness or numbness. No tingling 10-point ROS otherwise negative.  ____________________________________________   PHYSICAL EXAM:  VITAL SIGNS: ED Triage Vitals  Enc Vitals Group     BP 02/06/16 1730 129/73 mmHg     Pulse Rate 02/06/16 1730 79     Resp 02/06/16 1730 16     Temp 02/06/16 1730 97.8 F (36.6 C)     Temp Source 02/06/16 1730 Oral     SpO2 02/06/16 1730 94 %     Weight 02/06/16 1730 140 lb (63.504 kg)     Height 02/06/16 1730 5\' 5"  (1.651 m)     Head Cir --      Peak Flow --      Pain Score 02/06/16 1733 9     Pain Loc --      Pain Edu? --      Excl. in [place]? --      Constitutional: Alert and oriented. Well appearing and in no acute distress. Eyes: Conjunctivae are normal.  Head: Atraumatic. Neck: Supple with full range of motion. Hematological/Lymphatic/Immunilogical: No cervical lymphadenopathy. Cardiovascular: Normal rate, regular rhythm. Normal S1 and S2.  Good peripheral circulation with 2+ pulses noted in bilateral upper extremities. Respiratory: Normal respiratory effort without tachypnea or retractions. Lungs CTAB with breath sounds noted in all lung fields. Musculoskeletal: Tender to palpation about the left lateral upper rib line without any crepitus or bony abnormalities to palpation.  Neurologic:  Normal speech and language. No gross focal neurologic deficits are appreciated. Normal gait and posture  Skin:  Skin is warm, dry and intact. No rash, bruising, redness, swelling, open wounds or lacerations noted. Psychiatric: Mood and affect are normal. Speech and behavior are normal. Patient exhibits appropriate insight and  judgement.   ____________________________________________   LABS  None ____________________________________________  EKG  None ____________________________________________  RADIOLOGY I have personally viewed and evaluated these images (plain radiographs) as part of my medical decision making, as well as reviewing the written report by the radiologist.  Dg Ribs Unilateral W/chest Left  02/06/2016  CLINICAL DATA:  Injury to left breast and left axilla area. Pain. Site of pain marked with a BB. EXAM: LEFT RIBS AND CHEST - 3+ VIEW COMPARISON:  Chest x-ray dated 07/17/2014. FINDINGS: Cardiomediastinal silhouette is stable in size and configuration. Lungs are hyperexpanded indicating COPD. No evidence of pneumonia. No pleural effusion or pneumothorax seen. Osseous and soft tissue structures about the chest are unremarkable. No left-sided rib fracture or displacement seen. IMPRESSION: No acute findings. No rib fracture seen. Hyperexpanded lungs suggesting COPD/emphysema. Electronically Signed   By: Franki Cabot M.D.   On: 02/06/2016 18:54    ____________________________________________    PROCEDURES  Procedure(s) performed: None   Medications  ketorolac (TORADOL) 30 MG/ML injection 30 mg (30 mg Intramuscular Given 02/06/16 1834)     ____________________________________________   INITIAL IMPRESSION / ASSESSMENT AND PLAN / ED COURSE  Pertinent imaging results that were available during my care of the patient were reviewed by me and considered in my medical decision making (see chart for details).  Patient's diagnosis is consistent with Contusion of chest wall. Patient will be discharged home with prescriptions for naproxen to use as directed. Patient advised to apply ice to the affected area 20 minutes 3-4 times daily as needed for supportive care. Patient is to follow up with her primary care provider or [facility] if symptoms persist past this treatment course. Patient  is given ED precautions to return to the ED for any worsening or new symptoms.      ____________________________________________  FINAL CLINICAL IMPRESSION(S) / ED DIAGNOSES  Final diagnoses:  Contusion, chest wall, left, initial encounter  Chronic obstructive pulmonary disease, unspecified COPD type ([place])      NEW MEDICATIONS STARTED DURING THIS VISIT:  Discharge Medication List as of 02/06/2016  7:02 PM    START taking these medications   Details  naproxen (NAPROSYN) 500 MG tablet Take 1 tablet (500 mg total) by mouth 2 (two) times daily with a meal., Starting 02/06/2016, Until Discontinued, Print             Braxton Feathers, PA-C 02/07/16 0000  Orbie Pyo, MD 02/11/16 [phone]

## 2016-02-06 NOTE — ED Notes (Signed)
Swatting a bug behind washing machine, hit left breast and left axilla area.  C/o pain.  Onset of symptoms 2330 last night.

## 2016-02-06 NOTE — ED Notes (Signed)
Patient presents to the ED with left axillary pain, shoulder pain that began around 11pm after she was trying to kill a bug with a broom.  Patient states, "i've pulled muscles before but his really hurts."  Patient states her left breast is painful as well.

## 2016-11-30 ENCOUNTER — Telehealth: Payer: Self-pay | Admitting: Internal Medicine

## 2016-11-30 NOTE — Telephone Encounter (Signed)
LMOM for pt with what she should bring and to call back with any questions. Nothing further needed.

## 2016-12-07 ENCOUNTER — Encounter: Payer: Self-pay | Admitting: *Deleted

## 2016-12-07 ENCOUNTER — Emergency Department
Admission: EM | Admit: 2016-12-07 | Discharge: 2016-12-07 | Disposition: A | Discharge status: Home / Self Care | Payer: Medicare Other | Attending: Emergency Medicine | Admitting: Emergency Medicine

## 2016-12-07 DIAGNOSIS — S46912A Strain of unspecified muscle, fascia and tendon at shoulder and upper arm level, left arm, initial encounter: Secondary | ICD-10-CM | POA: Insufficient documentation

## 2016-12-07 DIAGNOSIS — Z85038 Personal history of other malignant neoplasm of large intestine: Secondary | ICD-10-CM | POA: Insufficient documentation

## 2016-12-07 DIAGNOSIS — T148XXA Other injury of unspecified body region, initial encounter: Secondary | ICD-10-CM

## 2016-12-07 DIAGNOSIS — J449 Chronic obstructive pulmonary disease, unspecified: Secondary | ICD-10-CM | POA: Insufficient documentation

## 2016-12-07 DIAGNOSIS — Y929 Unspecified place or not applicable: Secondary | ICD-10-CM | POA: Insufficient documentation

## 2016-12-07 DIAGNOSIS — X58XXXA Exposure to other specified factors, initial encounter: Secondary | ICD-10-CM | POA: Insufficient documentation

## 2016-12-07 DIAGNOSIS — Y998 Other external cause status: Secondary | ICD-10-CM | POA: Insufficient documentation

## 2016-12-07 DIAGNOSIS — Z87891 Personal history of nicotine dependence: Secondary | ICD-10-CM | POA: Diagnosis not present

## 2016-12-07 DIAGNOSIS — S46911A Strain of unspecified muscle, fascia and tendon at shoulder and upper arm level, right arm, initial encounter: Secondary | ICD-10-CM | POA: Insufficient documentation

## 2016-12-07 DIAGNOSIS — Y9389 Activity, other specified: Secondary | ICD-10-CM | POA: Diagnosis not present

## 2016-12-07 DIAGNOSIS — S4991XA Unspecified injury of right shoulder and upper arm, initial encounter: Secondary | ICD-10-CM | POA: Diagnosis present

## 2016-12-07 LAB — URINALYSIS, ROUTINE W REFLEX MICROSCOPIC
Bacteria, UA: NONE SEEN
Bilirubin Urine: NEGATIVE
Glucose, UA: NEGATIVE mg/dL
Ketones, ur: 5 mg/dL — AB
Leukocytes, UA: NEGATIVE
Nitrite: NEGATIVE
Protein, ur: NEGATIVE mg/dL
Specific Gravity, Urine: 1.017 (ref 1.005–1.030)
pH: 5 (ref 5.0–8.0)

## 2016-12-07 MED ORDER — CYCLOBENZAPRINE HCL 10 MG PO TABS: 10.0000 mg | ORAL_TABLET | Freq: Three times a day (TID) | ORAL | 0 refills | Status: DC | PRN

## 2016-12-07 MED ORDER — TRAMADOL HCL 50 MG PO TABS: 50.0000 mg | ORAL_TABLET | Freq: Two times a day (BID) | ORAL | 0 refills | Status: DC | PRN

## 2016-12-07 NOTE — ED Provider Notes (Signed)
[facility] Emergency Department Provider Note   ____________________________________________   First MD Initiated Contact with Patient 12/07/16 1230     (approximate)  I have reviewed the triage vital signs and the nursing notes.   HISTORY  Chief Complaint Back Pain    HPI Kelli Phillips is a 66 y.o. female patient complaining of bilateral upper back pain for 3 days. Patient state she believes this is secondary to increased physical activity. Patient also states she's has a nonproductive cough for approximately one week. Patient rates the pain as a 6/10. Patient described a pain as "achy". No palliative measures taken for this complaint.   Past Medical History:  Diagnosis Date  . Cancer ([place])   . COPD (chronic obstructive pulmonary disease) ([facility])     There are no active problems to display for this patient.   Past Surgical History:  Procedure Laterality Date  . CESAREAN SECTION    . COLON SURGERY      Prior to Admission medications   Medication Sig Start Date End Date Taking? Authorizing Provider  cyclobenzaprine (FLEXERIL) 10 MG tablet Take 1 tablet (10 mg total) by mouth 3 (three) times daily as needed. 12/07/16   Sable Feil, PA-C  naproxen (NAPROSYN) 500 MG tablet Take 1 tablet (500 mg total) by mouth 2 (two) times daily with a meal. 02/06/16   Jami L Hagler, PA-C  traMADol (ULTRAM) 50 MG tablet Take 1 tablet (50 mg total) by mouth every 12 (twelve) hours as needed. 12/07/16   Sable Feil, PA-C    Allergies Penicillins and Sulfa antibiotics  History reviewed. No pertinent family history.  Social History Social History  Substance Use Topics  . Smoking status: Former Research scientist (life sciences)  . Smokeless tobacco: Never Used  . Alcohol use No    Review of Systems Constitutional: No fever/chills Eyes: No visual changes. ENT: No sore throat. Cardiovascular: Denies chest pain. Respiratory: Denies shortness of breath. Gastrointestinal: No  abdominal pain.  No nausea, no vomiting.  No diarrhea.  No constipation. Genitourinary: Negative for dysuria. Musculoskeletal: Positive for  back pain. Skin: Negative for rash. Neurological: Negative for headaches, focal weakness or numbness. Hematological/Lymphatic: Allergic/Immunilogical: Penicillin and Bactrim. ____________________________________________   PHYSICAL EXAM:  VITAL SIGNS: ED Triage Vitals  Enc Vitals Group     BP 12/07/16 1156 126/79     Pulse Rate 12/07/16 1156 92     Resp 12/07/16 1156 18     Temp 12/07/16 1156 98.3 F (36.8 C)     Temp Source 12/07/16 1156 Oral     SpO2 12/07/16 1156 95 %     Weight 12/07/16 1157 132 lb (59.9 kg)     Height 12/07/16 1157 5\' 4"  (1.626 m)     Head Circumference --      Peak Flow --      Pain Score 12/07/16 1156 6     Pain Loc --      Pain Edu? --      Excl. in [place]? --     Constitutional: Alert and oriented. Well appearing and in no acute distress. Eyes: Conjunctivae are normal. PERRL. EOMI. Head: Atraumatic. Nose: No congestion/rhinnorhea. Mouth/Throat: Mucous membranes are moist.  Oropharynx non-erythematous. Neck: No stridor.  No cervical spine tenderness to palpation. Hematological/Lymphatic/Immunilogical: No cervical lymphadenopathy. Cardiovascular: Normal rate, regular rhythm. Grossly normal heart sounds.  Good peripheral circulation. Respiratory: Normal respiratory effort.  No retractions. Lungs CTAB. Gastrointestinal: Soft and nontender. No distention. No abdominal bruits. No CVA tenderness.  Musculoskeletal: No obvious back deformity. Patient has full nuchal radiation of motion of upper extremities. Patient is moderate guarding palpation bilateral inferior scapular muscle area.  Neurologic:  Normal speech and language. No gross focal neurologic deficits are appreciated. No gait instability. Skin:  Skin is warm, dry and intact. No rash noted. Psychiatric: Mood and affect are normal. Speech and behavior are  normal.  ____________________________________________   LABS (all labs ordered are listed, but only abnormal results are displayed)  Labs Reviewed  URINALYSIS, ROUTINE W REFLEX MICROSCOPIC   ____________________________________________  EKG   ____________________________________________  RADIOLOGY   ____________________________________________   PROCEDURES  Procedure(s) performed: None  Procedures  Critical Care performed: No  ____________________________________________   INITIAL IMPRESSION / ASSESSMENT AND PLAN / ED COURSE  Pertinent labs & imaging results that were available during my care of the patient were reviewed by me and considered in my medical decision making (see chart for details).  Bilateral scapular muscle strain.      ____________________________________________   FINAL CLINICAL IMPRESSION(S) / ED DIAGNOSES  Final diagnoses:  Muscle strain  Bilateral scapular muscle strain. Patient given discharge care instructions. Patient given prescription for tramadol and ibuprofen. Patient advised follow-up [facility] if no improvement in 3-5 days.    NEW MEDICATIONS STARTED DURING THIS VISIT:  New Prescriptions   CYCLOBENZAPRINE (FLEXERIL) 10 MG TABLET    Take 1 tablet (10 mg total) by mouth 3 (three) times daily as needed.   TRAMADOL (ULTRAM) 50 MG TABLET    Take 1 tablet (50 mg total) by mouth every 12 (twelve) hours as needed.     Note:  This document was prepared using Dragon voice recognition software and may include unintentional dictation errors.    Sable Feil, PA-C 12/07/16 1243    Eula Listen, MD 12/07/16 1524

## 2016-12-07 NOTE — ED Triage Notes (Signed)
States right back pain for 3 days, denies any urinary symptoms, states pain increases with movement, ambulatory

## 2016-12-17 ENCOUNTER — Encounter: Payer: Self-pay | Admitting: Internal Medicine

## 2016-12-17 ENCOUNTER — Ambulatory Visit (INDEPENDENT_AMBULATORY_CARE_PROVIDER_SITE_OTHER): Payer: Medicare Other | Admitting: Internal Medicine

## 2016-12-17 VITALS — BP 124/68 | HR 90 | Wt 137.0 lb

## 2016-12-17 DIAGNOSIS — J449 Chronic obstructive pulmonary disease, unspecified: Secondary | ICD-10-CM | POA: Diagnosis not present

## 2016-12-17 DIAGNOSIS — R938 Abnormal findings on diagnostic imaging of other specified body structures: Secondary | ICD-10-CM | POA: Diagnosis not present

## 2016-12-17 DIAGNOSIS — R9389 Abnormal findings on diagnostic imaging of other specified body structures: Secondary | ICD-10-CM

## 2016-12-17 MED ORDER — UMECLIDINIUM BROMIDE 62.5 MCG/INH IN AEPB: 1.0000 | INHALATION_SPRAY | Freq: Every day | RESPIRATORY_TRACT | 5 refills | Status: DC

## 2016-12-17 MED ORDER — FLUTICASONE FUROATE-VILANTEROL 200-25 MCG/INH IN AEPB: 1.0000 | INHALATION_SPRAY | Freq: Every day | RESPIRATORY_TRACT | 5 refills | Status: DC

## 2016-12-17 NOTE — Progress Notes (Signed)
[facility] Consultation      Date: 12/17/2016,   MRN# [id number] Kelli Phillips [birth year] Code Status:  Code Status History    This patient does not have a recorded code status. Please follow your organizational policy for patients in this situation.     Hosp day:@LENGTHOFSTAYDAYS @ Referring MD: @ATDPROV @     PCP:      AdmissionWeight: 137 lb (62.1 kg)                 CurrentWeight: 137 lb (62.1 kg) Kelli Phillips is a 66 y.o. old female seen in consultation for COPD at the request of Self.     CHIEF COMPLAINT:   SOB   HISTORY OF PRESENT ILLNESS   65 yo white female with previous extensive smoking history with Dx of COPD 3 years ago after being hospitalized Patient has chronic SOB and DOE for many years Has associated intermittent wheezing and cough  She has been prescribed oxygen but uses as needed Uses only ventolin as needed, no other inhalers  Patient previously seen Dr Raul Del She has had COPD exacerbation last month used prednisone and feels better  No signs of infection at this time  Former smoker quit 10 years ago 1 ppd for 68 years Brother died of lung cancer   PAST MEDICAL HISTORY   Past Medical History:  Diagnosis Date  . Cancer ([place])   . COPD (chronic obstructive pulmonary disease) ([place])      SURGICAL HISTORY   Past Surgical History:  Procedure Laterality Date  . CESAREAN SECTION    . COLON SURGERY       FAMILY HISTORY   History reviewed. No pertinent family history.   SOCIAL HISTORY   Social History  Substance Use Topics  . Smoking status: Former Research scientist (life sciences)  . Smokeless tobacco: Never Used  . Alcohol use No     MEDICATIONS    Home Medication:  Current Outpatient Rx  . Order #: [id number] Class: Historical Med  . Order #: [id number] Class: Print  . Order #: [id number] Class: Print  . Order #: [id number] Class: Print    Current Medication:  Current Outpatient Prescriptions:  .  albuterol (PROVENTIL  HFA;VENTOLIN HFA) 108 (90 Base) MCG/ACT inhaler, Inhale 2 puffs into the lungs every 6 (six) hours as needed for wheezing or shortness of breath., Disp: , Rfl:  .  cyclobenzaprine (FLEXERIL) 10 MG tablet, Take 1 tablet (10 mg total) by mouth 3 (three) times daily as needed., Disp: 15 tablet, Rfl: 0 .  naproxen (NAPROSYN) 500 MG tablet, Take 1 tablet (500 mg total) by mouth 2 (two) times daily with a meal., Disp: 14 tablet, Rfl: 0 .  traMADol (ULTRAM) 50 MG tablet, Take 1 tablet (50 mg total) by mouth every 12 (twelve) hours as needed., Disp: 12 tablet, Rfl: 0    ALLERGIES   Penicillins and Sulfa antibiotics     REVIEW OF SYSTEMS   Review of Systems  Constitutional: Positive for malaise/fatigue. Negative for chills, diaphoresis, fever and weight loss.  HENT: Negative for congestion and hearing loss.   Eyes: Negative for blurred vision and double vision.  Respiratory: Positive for cough, shortness of breath and wheezing. Negative for hemoptysis and sputum production.   Cardiovascular: Negative for chest pain, palpitations and orthopnea.  Gastrointestinal: Negative for abdominal pain, heartburn, nausea and vomiting.  Genitourinary: Negative for dysuria and urgency.  Musculoskeletal: Negative for back pain, myalgias and neck pain.  Skin: Negative for rash.  Neurological: Negative for dizziness and  weakness.  Endo/Heme/Allergies: Does not bruise/bleed easily.  Psychiatric/Behavioral: Negative for substance abuse.  All other systems reviewed and are negative.    VS: BP 124/68 (BP Location: Left Arm, Cuff Size: Normal)   Pulse 90   Wt 137 lb (62.1 kg)   SpO2 96%   BMI 23.52 kg/m      PHYSICAL EXAM  Physical Exam  Constitutional: She is oriented to person, place, and time. She appears well-developed and well-nourished. No distress.  HENT:  Head: Normocephalic and atraumatic.  Mouth/Throat: No oropharyngeal exudate.  Eyes: EOM are normal. Pupils are equal, round, and reactive  to light. No scleral icterus.  Neck: Normal range of motion. Neck supple.  Cardiovascular: Normal rate, regular rhythm and normal heart sounds.   No murmur heard. Pulmonary/Chest: No stridor. No respiratory distress. She has no wheezes.  Abdominal: Soft. Bowel sounds are normal.  Musculoskeletal: Normal range of motion. She exhibits no edema.  Neurological: She is alert and oriented to person, place, and time. No cranial nerve deficit.  Skin: Skin is warm. She is not diaphoretic.  Psychiatric: She has a normal mood and affect.         IMAGING    I have Independently reviewed images of  CXR from 2015   on 12/17/2016 Interpretation:  -Persistent mild blunting of the right lateral costophrenic angle Hyperinflated lungs Will need CT chest for further assessment     ASSESSMENT/PLAN   67 yo white female with extensive smoking history with DX of COPD noted to be severe with recent COPD exacerbation with abnormal CXR finding in 2015, with chronic hypoxic resp failure  1.will need ONO 2.will need full set of PFT's 3.CT chest to assess for residual opacity 4.start Breo and start Incruse 5.albuterol as needed  Follow up after tests completed in 1 month to assess resp status and discuss test results   Patient satisfied with Plan of action and management. All questions answered  Corrin Parker, M.D.  [facility]  Medical Director [place] Director [facility] Cardio-Pulmonary Department

## 2016-12-17 NOTE — Patient Instructions (Signed)
Continue oxygen as prescribed Check ONO Check PFT's Start Breo and Incruse inhalers

## 2016-12-18 ENCOUNTER — Ambulatory Visit: Payer: Medicare Other | Admitting: Internal Medicine

## 2016-12-21 ENCOUNTER — Telehealth: Payer: Self-pay | Admitting: Internal Medicine

## 2016-12-21 NOTE — Telephone Encounter (Signed)
Pt calling stating home health called her to get a charge card for her to do the home sleep study. She refuses to do that Would like another option where she does not need to give out her card Please advise.

## 2016-12-21 NOTE — Telephone Encounter (Signed)
Is there another DME pt can go too. She doesn't want to put a card on file with [facility].

## 2016-12-23 NOTE — Telephone Encounter (Signed)
Spoke with Darlina Guys with [facility] to see if something can be worked out with [facility] instead of changing companies.  Waiting on an answer from [facility]. Rhonda J Cobb

## 2016-12-24 NOTE — Telephone Encounter (Signed)
Spoke with Darlina Guys at [facility], Ailene Ravel with [facility] attempted to contact patient and was unable to reach patient. Ailene Ravel [facility] for pt to contact her and left her extension number.  Will contact patient tomorrow and discuss patients options with her. Rhonda J Cobb

## 2016-12-25 NOTE — Telephone Encounter (Signed)
Called and [facility] for pt to return my call to see if she would like to change DME companies. Rhonda J Cobb

## 2016-12-25 NOTE — Telephone Encounter (Signed)
Pt returned call and has decided to stay with [facility]. Pt just wanted to make sure that [facility] would delete information once she returned the device.  Advised patient that this only applied to the ONO device and nothing would be charged to her card as long as she returned the device and advised [facility] to delete her card information. Advised patient that I would let [facility] know to proceed with arranging the ONO, pt requesting that it not be scheduled until after 12/31/16.  Message sent to Darlina Guys to advise of the above and to arrange for p/u of the device after 12/31/16 at the [facility] in [place].  Nothing else needed at this time. Rhonda J Cobb

## 2017-01-07 ENCOUNTER — Telehealth: Payer: Self-pay | Admitting: Internal Medicine

## 2017-01-07 DIAGNOSIS — J449 Chronic obstructive pulmonary disease, unspecified: Secondary | ICD-10-CM

## 2017-01-07 NOTE — Telephone Encounter (Signed)
Patient aware of ONO result. She does require 2L of 02 at night. Orders have been placed. Nothing further needed.

## 2017-01-08 ENCOUNTER — Telehealth: Payer: Self-pay | Admitting: *Deleted

## 2017-01-08 NOTE — Telephone Encounter (Signed)
Patient calling asking if she can have abx called in she thinks she has sinus infection.  Patient states sx: runny nose, nasal drainage, ( dark green) mucus with cough for 2 days.  Please advise.

## 2017-01-10 NOTE — Telephone Encounter (Signed)
augmentin 875 mg PO BID for 10 days

## 2017-01-11 MED ORDER — AMOXICILLIN-POT CLAVULANATE 875-125 MG PO TABS: 1.0000 | ORAL_TABLET | Freq: Two times a day (BID) | ORAL | 0 refills | Status: AC

## 2017-02-02 ENCOUNTER — Telehealth: Payer: Self-pay | Admitting: Internal Medicine

## 2017-02-02 MED ORDER — UMECLIDINIUM-VILANTEROL 62.5-25 MCG/INH IN AEPB: 1.0000 | INHALATION_SPRAY | Freq: Every day | RESPIRATORY_TRACT | 5 refills | Status: DC

## 2017-02-02 NOTE — Telephone Encounter (Signed)
Patient states she is having a hard time with  Breo and Incruse. She can't handle the texture in her mouth. She says she will just continue with Albuterol. Explained to patient this Albuterol is for rescue only. Please advise.

## 2017-02-02 NOTE — Telephone Encounter (Signed)
Try anoro please

## 2017-02-02 NOTE — Telephone Encounter (Signed)
Pt informed to d/c Breo and Incruse and start Anoro 1 puff daily.

## 2017-02-15 ENCOUNTER — Other Ambulatory Visit: Payer: Self-pay | Admitting: Family Medicine

## 2017-02-15 DIAGNOSIS — Z1231 Encounter for screening mammogram for malignant neoplasm of breast: Secondary | ICD-10-CM

## 2017-02-15 DIAGNOSIS — Z13820 Encounter for screening for osteoporosis: Secondary | ICD-10-CM

## 2017-02-24 ENCOUNTER — Other Ambulatory Visit: Payer: Self-pay | Admitting: Family Medicine

## 2017-02-24 DIAGNOSIS — Z1231 Encounter for screening mammogram for malignant neoplasm of breast: Secondary | ICD-10-CM

## 2017-03-17 ENCOUNTER — Other Ambulatory Visit: Payer: Self-pay | Admitting: Family Medicine

## 2017-03-17 DIAGNOSIS — Z1231 Encounter for screening mammogram for malignant neoplasm of breast: Secondary | ICD-10-CM

## 2017-03-18 ENCOUNTER — Ambulatory Visit: Admission: RE | Admit: 2017-03-18 | Payer: Medicare Other | Source: Ambulatory Visit

## 2017-03-18 ENCOUNTER — Ambulatory Visit: Payer: Medicare Other | Attending: Internal Medicine

## 2017-03-23 ENCOUNTER — Ambulatory Visit: Payer: Medicare Other | Admitting: Internal Medicine

## 2017-03-25 ENCOUNTER — Other Ambulatory Visit: Payer: Medicare Other

## 2017-03-26 ENCOUNTER — Telehealth: Payer: Self-pay | Admitting: Internal Medicine

## 2017-03-26 NOTE — Telephone Encounter (Signed)
R/T call to patient and re-scheduled f/u cancelled earlier this week for 03/29/17.

## 2017-03-26 NOTE — Telephone Encounter (Signed)
Pt states she is having congestion for about 3 days. She thinks she needs a Zpak. Please call

## 2017-03-29 ENCOUNTER — Ambulatory Visit: Payer: Medicare Other | Admitting: Internal Medicine

## 2017-03-31 ENCOUNTER — Ambulatory Visit
Admission: RE | Admit: 2017-03-31 | Discharge: 2017-03-31 | Disposition: A | Discharge status: Home / Self Care | Payer: Medicare Other | Source: Ambulatory Visit | Attending: Family Medicine | Admitting: Family Medicine

## 2017-03-31 DIAGNOSIS — N6320 Unspecified lump in the left breast, unspecified quadrant: Secondary | ICD-10-CM | POA: Diagnosis present

## 2017-03-31 DIAGNOSIS — Z1231 Encounter for screening mammogram for malignant neoplasm of breast: Secondary | ICD-10-CM

## 2017-03-31 DIAGNOSIS — N644 Mastodynia: Secondary | ICD-10-CM | POA: Insufficient documentation

## 2017-04-23 ENCOUNTER — Ambulatory Visit: Payer: Medicare Other | Admitting: Internal Medicine

## 2017-04-23 ENCOUNTER — Ambulatory Visit (INDEPENDENT_AMBULATORY_CARE_PROVIDER_SITE_OTHER): Payer: Medicare Other | Admitting: Pulmonary Disease

## 2017-04-23 ENCOUNTER — Encounter: Payer: Self-pay | Admitting: Pulmonary Disease

## 2017-04-23 VITALS — BP 120/82 | HR 83 | Ht 64.0 in | Wt 136.0 lb

## 2017-04-23 DIAGNOSIS — Z87891 Personal history of nicotine dependence: Secondary | ICD-10-CM | POA: Diagnosis not present

## 2017-04-23 DIAGNOSIS — J329 Chronic sinusitis, unspecified: Secondary | ICD-10-CM

## 2017-04-23 DIAGNOSIS — J449 Chronic obstructive pulmonary disease, unspecified: Secondary | ICD-10-CM

## 2017-04-23 MED ORDER — FLUTICASONE PROPIONATE 50 MCG/ACT NA SUSP: 2.0000 | Freq: Every day | NASAL | 10 refills | Status: DC

## 2017-04-23 MED ORDER — UMECLIDINIUM BROMIDE 62.5 MCG/INH IN AEPB: 1.0000 | INHALATION_SPRAY | Freq: Every day | RESPIRATORY_TRACT | 0 refills | Status: DC

## 2017-04-23 NOTE — Patient Instructions (Signed)
Trial of Incruse inhaler - 2 samples provided. Use one inhalation daily for 1 week, then off for 1 week, then repeat. When you return, he should be able to tell whether Incruse helps your shortness of breath  Flonase nasal inhaler - 2 sprays per nostril daily  Lung function tests reordered to be performed prior to next office visit  Follow-up in 3-4 months with Dr. Mortimer Fries

## 2017-04-26 NOTE — Progress Notes (Signed)
PULMONARY OFFICE FOLLOW UP NOTE   PT PROFILE: 66 y.o. female former smoker, patient of Dr. Mortimer Fries with suspected COPD  Subjective:  Previously seen by Dr. Mortimer Fries. PFTs were ordered but she never got these done. She did undergo overnight oximetry and is now on nocturnal oxygen. She does not wear this every night. She reports mostly nonproductive cough and nasal congestion with posterior nasal drainage. She uses her albuterol 1-2 times per day. She feels that it is beneficial. She was previously prescribed Anoro inhaler which she is not using. She was also previously prescribed increase inhaler, again not using this medication. Denies CP, fever, purulent sputum, hemoptysis, LE edema and calf tenderness.   Vitals:   04/23/17 1118 04/23/17 1121  BP:  120/82  Pulse:  83  SpO2:  91%  Weight: 136 lb (61.7 kg)   Height: 5\' 4"  (1.626 m)      EXAM:  Gen: NAD HEENT: WNL Lungs: Mildly diminished, no adventitious sounds Cardiovascular: Regular, no murmurs Abdomen: Soft, nontender, normal BS Ext: without clubbing, cyanosis, edema Neuro: grossly intact  DATA:   BMP Latest Ref Rng & Units 09/04/2014 07/17/2014 06/25/2014  Glucose 65 - 99 mg/dL 126(H) 125(H) 115(H)  BUN 7 - 18 mg/dL 14 9 14   Creatinine 0.60 - 1.30 mg/dL 0.70 0.72 0.65  Sodium 136 - 145 mmol/L 141 141 140  Potassium 3.5 - 5.1 mmol/L 4.0 3.2(L) 4.2  Chloride 98 - 107 mmol/L 105 109(H) 104  CO2 21 - 32 mmol/L 31 29 31   Calcium 8.5 - 10.1 mg/dL 9.1 8.5 8.8    CBC Latest Ref Rng & Units 09/04/2014 07/17/2014 06/25/2014  WBC 3.6 - 11.0 x10 3/mm 3 7.2 6.5 6.6  Hemoglobin 12.0 - 16.0 g/dL 14.7 13.3 13.2  Hematocrit 35.0 - 47.0 % 46.2 41.2 41.1  Platelets 150 - 440 x10 3/mm 3 194 192 194    CXR:  No recent film  IMPRESSION:     ICD-10-CM   1. Former smoker Z87.891 Pulmonary Function Test [facility] Only  2. Suspected COPD - she never got PFTs that were scheduled J44.9 Pulmonary Function Test [facility] Only  3. Chronic sinusitis,  unspecified location J32.9      PLAN:  Trial of Incruse inhaler - 2 samples provided. Use one inhalation daily for 1 week, then off for 1 week, then repeat. When she returns, she should be able to report whether Incruse helps her shortness of breath  Flonase nasal inhaler - 2 sprays per nostril daily  PFTs reordered to be performed prior to next office visit  Follow-up in 3-4 months with Dr. Clydell Hakim, MD PCCM service Mobile [phone] Pager [phone] 04/26/2017 1:17 PM

## 2017-05-13 ENCOUNTER — Telehealth: Payer: Self-pay | Admitting: Internal Medicine

## 2017-05-13 MED ORDER — AZITHROMYCIN 250 MG PO TABS: ORAL_TABLET | ORAL | 0 refills | Status: DC

## 2017-05-13 NOTE — Telephone Encounter (Signed)
Advise on message below. 

## 2017-05-13 NOTE — Telephone Encounter (Signed)
z pak please 

## 2017-05-13 NOTE — Telephone Encounter (Signed)
Patient wants to know if Dr. Mortimer Fries will call in an rx for sinus infection and productive cough with yellow sputum    Please call.

## 2017-08-26 ENCOUNTER — Telehealth: Payer: Self-pay | Admitting: Internal Medicine

## 2017-08-26 NOTE — Telephone Encounter (Signed)
FYI:  Patient switched back to [facility]. Patient states Incruse gave her a reaction. She feels better controlled now on Anoro. It is not time for another refill on Anoro until 09/02/2017 and insurance will not cover until then.

## 2017-08-26 NOTE — Telephone Encounter (Signed)
Pt calling stating that medicare won't pay for her Inhaler until Jan 3rd  She is about out and would like some advise  She also needs to schedule a PFT   Please call back

## 2017-08-28 NOTE — Telephone Encounter (Signed)
Thank you :)

## 2017-09-01 ENCOUNTER — Telehealth: Payer: Self-pay | Admitting: Internal Medicine

## 2017-09-01 NOTE — Telephone Encounter (Signed)
Given to [facility] for scheduling.

## 2017-09-01 NOTE — Telephone Encounter (Signed)
Needs pft Prior to OV

## 2017-09-23 ENCOUNTER — Ambulatory Visit: Payer: Medicare Other | Attending: Pulmonary Disease

## 2017-09-24 ENCOUNTER — Telehealth: Payer: Self-pay | Admitting: Internal Medicine

## 2017-09-24 NOTE — Telephone Encounter (Signed)
FYI-  Patient confirmed appt but did not know about missed pft appt on 01-24  Patient declined to r.s as she is riding the medicare funded transportation bus and wants to be seen to address inhalers

## 2017-09-27 ENCOUNTER — Encounter: Payer: Self-pay | Admitting: Internal Medicine

## 2017-09-27 ENCOUNTER — Ambulatory Visit (INDEPENDENT_AMBULATORY_CARE_PROVIDER_SITE_OTHER): Payer: Medicare Other | Admitting: Internal Medicine

## 2017-09-27 VITALS — BP 122/80 | HR 74 | Ht 64.0 in | Wt 143.0 lb

## 2017-09-27 DIAGNOSIS — J449 Chronic obstructive pulmonary disease, unspecified: Secondary | ICD-10-CM

## 2017-09-27 MED ORDER — AZITHROMYCIN 250 MG PO TABS: ORAL_TABLET | ORAL | 0 refills | Status: DC

## 2017-09-27 MED ORDER — ANORO ELLIPTA 62.5-25 MCG/INH IN AEPB: 1.0000 | INHALATION_SPRAY | Freq: Every day | RESPIRATORY_TRACT | 5 refills | Status: DC

## 2017-09-27 MED ORDER — ALBUTEROL SULFATE HFA 108 (90 BASE) MCG/ACT IN AERS: 2.0000 | INHALATION_SPRAY | Freq: Four times a day (QID) | RESPIRATORY_TRACT | 2 refills | Status: DC | PRN

## 2017-09-27 NOTE — Progress Notes (Signed)
PULMONARY OFFICE FOLLOW UP NOTE   PT PROFILE: 67 y.o. female former smoker, patient of Dr. Mortimer Fries with suspected COPD   CC Follow up COPD    SUBJECTIVE PFTs were ordered but she never got these done.  She did undergo overnight oximetry and is now on nocturnal oxygen.  She reports mostly nonproductive cough and nasal congestion with posterior nasal drainage Has tuned yellow/green now with sins pain She uses her albuterol 1-2 times per day. She feels that it is beneficial. She was previously prescribed Anoro inhaler and working well Quit tobacco abuse 10 years ago 40 pack year tobacco abuse Denies CP, fever, purulent sputum, hemoptysis, LE edema and calf tenderness.   Vitals:   09/27/17 1035 09/27/17 1043  BP:  122/80  Pulse:  74  SpO2:  96%  Weight: 143 lb (64.9 kg)   Height: 5\' 4"  (1.626 m)      Review of Systems:  Gen:  Denies  fever, sweats, chills weigh loss  HEENT: +sinus pain Cardiac:  No dizziness, chest pain or heaviness, chest tightness,edema Resp:   Denies cough or sputum porduction, shortness of breath,wheezing, hemoptysis,  Other:  All other systems negative  EXAM:  Gen: NAD HEENT: WNL, sinus pressures Lungs: Mildly diminished, no adventitious sounds Cardiovascular: Regular, no murmurs Abdomen: Soft, nontender, normal BS Ext: without clubbing, cyanosis, edema Neuro: grossly intact  Assessment 67 yo white female with signs and symptoms of COPD with acute sinus infection with long standing smoking history With 40 pack year tobacco abuse with chronic hypoxic resp failure on oxygen(patient states she uses and benefits from therapy)  PLAN:  1.continue oxygen as prescribed  2.continue ANoro as prescribed  3.albuterol as needed  4.PFT's ordered  5.FLonase  6.Z pak for sinus infection as this has helped in the past  7.CT lung cancer screening referral   Patient  satisfied with Plan of action and management. All questions answered  Corrin Parker, M.D.  [facility]  Medical Director [place] Director [facility] Cardio-Pulmonary Department

## 2017-09-27 NOTE — Patient Instructions (Addendum)
Z pak CT lung cancer screening protocol Needs PFT's

## 2017-09-28 ENCOUNTER — Telehealth: Payer: Self-pay | Admitting: *Deleted

## 2017-09-28 NOTE — Telephone Encounter (Signed)
Received referral for low dose lung cancer screening CT scan. Message left at phone number listed in EMR for patient to call me back to facilitate scheduling scan.  

## 2017-10-06 ENCOUNTER — Telehealth: Payer: Self-pay | Admitting: *Deleted

## 2017-10-06 NOTE — Telephone Encounter (Signed)
Received referral for low dose lung cancer screening CT scan. Message left at phone number listed in EMR for patient to call me back to facilitate scheduling scan.  

## 2017-10-08 ENCOUNTER — Telehealth: Payer: Self-pay | Admitting: Internal Medicine

## 2017-10-08 MED ORDER — DOXYCYCLINE HYCLATE 100 MG PO TABS: 100.0000 mg | ORAL_TABLET | Freq: Two times a day (BID) | ORAL | 0 refills | Status: DC

## 2017-10-08 NOTE — Telephone Encounter (Signed)
Pt calling stating she is having bad diarrhea.  She states she's taking she's only been able to take two pills out of the pills she has on the Z-pack. States this is causing the diarrhea.  She's has a sinus infection and doesn't want to end up in hospital  She can't take the Z-pack we prescribed but she needs something else called in for the infection   [facility] [place]   Would like a call back with advise for this

## 2017-10-08 NOTE — Telephone Encounter (Signed)
Please give Doxcycline 100 mg twice daily for 5 days  Please Stop z pak

## 2017-10-26 ENCOUNTER — Ambulatory Visit: Payer: Medicare Other | Attending: Internal Medicine

## 2017-11-08 ENCOUNTER — Telehealth: Payer: Self-pay | Admitting: Internal Medicine

## 2017-11-08 NOTE — Telephone Encounter (Signed)
Pt states she is coughing green/yellowish stuff up and states her back hurts due to this. Please call.

## 2017-11-09 ENCOUNTER — Other Ambulatory Visit: Payer: Self-pay | Admitting: Internal Medicine

## 2017-11-09 MED ORDER — AZITHROMYCIN 250 MG PO TABS: ORAL_TABLET | ORAL | 0 refills | Status: DC

## 2017-11-09 NOTE — Telephone Encounter (Signed)
Left message for patient that zpac sent to her pharmacy.

## 2017-11-09 NOTE — Telephone Encounter (Signed)
Contacted patient and made her aware [facility]

## 2017-11-09 NOTE — Telephone Encounter (Signed)
Patient calling in regards to call yesterday Stated nurses attempted to contact and there should be a zpak waiting Patient states [facility] has [facility] [place] and they say they do not have anything for patient Would like it resent and to speak with nurse Please call to discuss

## 2017-11-09 NOTE — Telephone Encounter (Signed)
z pak please 

## 2017-11-09 NOTE — Telephone Encounter (Signed)
Atttempted to call patient back to make aware zpak sent to her pharmacy. Patient phone not accepting calls at this time.

## 2017-11-10 ENCOUNTER — Telehealth: Payer: Self-pay

## 2017-11-10 MED ORDER — ALBUTEROL SULFATE HFA 108 (90 BASE) MCG/ACT IN AERS: 2.0000 | INHALATION_SPRAY | Freq: Four times a day (QID) | RESPIRATORY_TRACT | 2 refills | Status: DC | PRN

## 2017-11-10 NOTE — Telephone Encounter (Signed)
*  STAT* If patient is at the pharmacy, call can be transferred to refill team.   1. Which medications need to be refilled? (please list name of each medication and dose if known) ventolin    2. Which pharmacy/location (including street and city if local pharmacy) is medication to be sent to? [facility]  3. Do they need a 30 day or 90 day supply? 90 Also states her Anoro is not working right, states she is not getting enough puffs out of it.

## 2017-11-10 NOTE — Telephone Encounter (Signed)
RX for Ventolin submitted for refill. Left message for patient to call back with more detail re: Anoro.

## 2017-11-11 MED ORDER — GLYCOPYRROLATE-FORMOTEROL 9-4.8 MCG/ACT IN AERO: 2.0000 | INHALATION_SPRAY | Freq: Two times a day (BID) | RESPIRATORY_TRACT | 3 refills | Status: DC

## 2017-11-11 NOTE — Addendum Note (Signed)
Addended by: Stephanie Coup on: 11/11/2017 04:41 PM   Modules accepted: Orders

## 2017-11-11 NOTE — Telephone Encounter (Signed)
Patient insurance covers Bevespi, Combivent, Stiolto,Atrovent, Combivent.

## 2017-11-11 NOTE — Telephone Encounter (Signed)
Pt is having a hard time using Anoro inhaler. She is not sure if she is actually getting full medication benefit. She spoke with pharmacist who says sometimes inhalers don't work correctly. She would like inhaler change. Please advise.

## 2017-11-11 NOTE — Telephone Encounter (Signed)
Lets try [employer]

## 2017-11-11 NOTE — Telephone Encounter (Signed)
Please call your insurance company and obtain copy of your medication formulary  That takes place of ANORO  This will help your Pulmonologist to prescribe the most cost effective medications that your insurance company allows.

## 2017-11-11 NOTE — Telephone Encounter (Signed)
Bevespi sent to pharmacy. Left detailed message making patient aware another inhaler sent per her request.

## 2017-11-15 ENCOUNTER — Telehealth: Payer: Self-pay | Admitting: *Deleted

## 2017-11-15 NOTE — Telephone Encounter (Signed)
Received referral for low dose lung cancer screening CT scan. Message left at phone number listed in EMR for patient to call me back to facilitate scheduling scan.  

## 2017-11-23 ENCOUNTER — Telehealth: Payer: Self-pay | Admitting: *Deleted

## 2017-11-23 NOTE — Telephone Encounter (Signed)
Received referral for initial lung cancer screening scan. Contacted patient who refuses lung screening scan at this time.

## 2017-12-31 ENCOUNTER — Telehealth: Payer: Self-pay | Admitting: Internal Medicine

## 2017-12-31 NOTE — Telephone Encounter (Signed)
Patient wants to know if Kelli Phillips will send in an rx for sinus congestion infection .  She says last time she was given a little red pill that knocked it out quickly.  Please call.

## 2017-12-31 NOTE — Telephone Encounter (Signed)
Please advise on message below.

## 2018-01-03 ENCOUNTER — Other Ambulatory Visit: Payer: Self-pay | Admitting: *Deleted

## 2018-01-03 MED ORDER — AZITHROMYCIN 250 MG PO TABS: ORAL_TABLET | ORAL | 0 refills | Status: DC

## 2018-01-03 NOTE — Progress Notes (Signed)
rx sent

## 2018-01-03 NOTE — Telephone Encounter (Signed)
Per DK, can send a Z Pak to the pharmacy. If pt gets no better, will need to make an appt.

## 2018-01-03 NOTE — Telephone Encounter (Signed)
Patient advised rx zpak sent. If this does not resolve she will need appt. Pt verbalizes understanding.

## 2018-01-15 ENCOUNTER — Encounter: Payer: Self-pay | Admitting: Emergency Medicine

## 2018-01-15 ENCOUNTER — Emergency Department
Admission: EM | Admit: 2018-01-15 | Discharge: 2018-01-15 | Disposition: A | Discharge status: Home / Self Care | Payer: Medicare Other | Attending: Emergency Medicine | Admitting: Emergency Medicine

## 2018-01-15 ENCOUNTER — Other Ambulatory Visit: Payer: Self-pay

## 2018-01-15 DIAGNOSIS — Y999 Unspecified external cause status: Secondary | ICD-10-CM | POA: Insufficient documentation

## 2018-01-15 DIAGNOSIS — J449 Chronic obstructive pulmonary disease, unspecified: Secondary | ICD-10-CM | POA: Diagnosis not present

## 2018-01-15 DIAGNOSIS — Y92003 Bedroom of unspecified non-institutional (private) residence as the place of occurrence of the external cause: Secondary | ICD-10-CM | POA: Insufficient documentation

## 2018-01-15 DIAGNOSIS — X500XXA Overexertion from strenuous movement or load, initial encounter: Secondary | ICD-10-CM | POA: Insufficient documentation

## 2018-01-15 DIAGNOSIS — Z79899 Other long term (current) drug therapy: Secondary | ICD-10-CM | POA: Insufficient documentation

## 2018-01-15 DIAGNOSIS — Z87891 Personal history of nicotine dependence: Secondary | ICD-10-CM | POA: Insufficient documentation

## 2018-01-15 DIAGNOSIS — S3992XA Unspecified injury of lower back, initial encounter: Secondary | ICD-10-CM | POA: Diagnosis present

## 2018-01-15 DIAGNOSIS — Z85828 Personal history of other malignant neoplasm of skin: Secondary | ICD-10-CM | POA: Insufficient documentation

## 2018-01-15 DIAGNOSIS — Y9389 Activity, other specified: Secondary | ICD-10-CM | POA: Insufficient documentation

## 2018-01-15 DIAGNOSIS — S39012A Strain of muscle, fascia and tendon of lower back, initial encounter: Secondary | ICD-10-CM | POA: Insufficient documentation

## 2018-01-15 MED ORDER — OXYCODONE-ACETAMINOPHEN 5-325 MG PO TABS
1.0000 | ORAL_TABLET | Freq: Once | ORAL | Status: AC
  Administered 2018-01-15: 1 via ORAL
  Filled 2018-01-15: qty 1

## 2018-01-15 MED ORDER — HYDROCODONE-ACETAMINOPHEN 5-325 MG PO TABS: 1.0000 | ORAL_TABLET | Freq: Four times a day (QID) | ORAL | 0 refills | Status: DC | PRN

## 2018-01-15 MED ORDER — METHOCARBAMOL 500 MG PO TABS: 500.0000 mg | ORAL_TABLET | Freq: Four times a day (QID) | ORAL | 0 refills | Status: DC

## 2018-01-15 MED ORDER — METHOCARBAMOL 500 MG PO TABS
1000.0000 mg | ORAL_TABLET | Freq: Once | ORAL | Status: AC
  Administered 2018-01-15: 1000 mg via ORAL
  Filled 2018-01-15: qty 2

## 2018-01-15 NOTE — ED Notes (Signed)

## 2018-01-15 NOTE — ED Provider Notes (Signed)
[facility] Emergency Department Provider Note  ____________________________________________   First MD Initiated Contact with Patient 01/15/18 1219     (approximate)  I have reviewed the triage vital signs and the nursing notes.   HISTORY  Chief Complaint Back Pain   HPI Kelli Phillips is a 67 y.o. female is brought in via EMS with complaint of back pain.  Patient states that earlier today she was playing with her dog on top of her bed.  She states that she twisted toward the dog and felt pain in her lower back.  She states that prior to arrival she took 2 ibuprofen which has not helped.  She denies actual fall, no paresthesias to the lower extremities, no incontinence of bowel or bladder.  She rates her pain as a 10/10.   Past Medical History:  Diagnosis Date  . 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 COPD (chronic obstructive pulmonary disease) [facility])     Patient Active Problem List   Diagnosis Date Noted  . Adenomatous colon polyp 08/14/2014  . Midline low back pain with sciatica 08/14/2014  . Pulmonary emphysema ([place]) 08/14/2014  . 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 Rectal bleeding 01/30/2014    Past Surgical History:  Procedure Laterality Date  . CESAREAN SECTION    . COLON SURGERY      Prior to Admission medications   Medication Sig Start Date End Date Taking? Authorizing Provider  albuterol (PROVENTIL HFA;VENTOLIN HFA) 108 (90 Base) MCG/ACT inhaler Inhale 2 puffs into the lungs every 6 (six) hours as needed for wheezing or shortness of breath. 11/10/17   Flora Lipps, MD  ANORO ELLIPTA 62.5-25 MCG/INH AEPB Inhale 1 puff into the lungs daily. 09/27/17   Flora Lipps, MD  azithromycin (ZITHROMAX Z-PAK) 250 MG tablet Take 2 tablets on Day 1 and then 1 tablet daily till gone. 01/03/18   Flora Lipps, MD  cyclobenzaprine (FLEXERIL) 10 MG tablet Take 1 tablet (10 mg total) by mouth 3 (three) times daily as needed. 12/07/16   Sable Feil, PA-C    doxycycline (VIBRA-TABS) 100 MG tablet Take 1 tablet (100 mg total) by mouth 2 (two) times daily. 10/08/17   Flora Lipps, MD  fluticasone (FLONASE) 50 MCG/ACT nasal spray Place 2 sprays into both nostrils daily. 04/23/17 04/23/18  Wilhelmina Mcardle, MD  Glycopyrrolate-Formoterol (BEVESPI AEROSPHERE) 9-4.8 MCG/ACT AERO Inhale 2 puffs into the lungs 2 (two) times daily. 11/11/17   Flora Lipps, MD  HYDROcodone-acetaminophen (NORCO/VICODIN) 5-325 MG tablet Take 1 tablet by mouth every 6 (six) hours as needed for moderate pain. 01/15/18   Johnn Hai, PA-C  methocarbamol (ROBAXIN) 500 MG tablet Take 1 tablet (500 mg total) by mouth 4 (four) times daily. 01/15/18   Johnn Hai, PA-C  naproxen (NAPROSYN) 500 MG tablet Take 1 tablet (500 mg total) by mouth 2 (two) times daily with a meal. 02/06/16   Hagler, Jami L, PA-C  traMADol (ULTRAM) 50 MG tablet Take 1 tablet (50 mg total) by mouth every 12 (twelve) hours as needed. 12/07/16   Sable Feil, PA-C    Allergies Penicillins and Sulfa antibiotics  Family History  Problem Relation Age of Onset  . Breast cancer Paternal Aunt        ?    Social History Social History   Tobacco Use  . Smoking status: Former Research scientist (life sciences)  . Smokeless tobacco: Never Used  Substance Use Topics  . Alcohol use: No  .  Drug use: No    Review of Systems Constitutional: No fever/chills Cardiovascular: Denies chest pain. Respiratory: Denies shortness of breath. Gastrointestinal: No abdominal pain.  No nausea, no vomiting.  Genitourinary: Negative for dysuria. Musculoskeletal: Positive for low back pain. Skin: Negative for rash. Neurological: Negative for  focal weakness or numbness. ____________________________________________   PHYSICAL EXAM:  VITAL SIGNS: ED Triage Vitals  Enc Vitals Group     BP 01/15/18 1216 130/77     Pulse Rate 01/15/18 1216 76     Resp 01/15/18 1216 18     Temp 01/15/18 1216 97.7 F (36.5 C)     Temp Source 01/15/18 1216 Oral      SpO2 01/15/18 1216 94 %     Weight 01/15/18 1217 140 lb (63.5 kg)     Height 01/15/18 1217 5\' 1"  (1.549 m)     Head Circumference --      Peak Flow --      Pain Score 01/15/18 1216 10     Pain Loc --      Pain Edu? --      Excl. in [place]? --    Constitutional: Alert and oriented. Well appearing and in no acute distress. Eyes: Conjunctivae are normal.  Head: Atraumatic. Nose: No congestion/rhinnorhea. Neck: No stridor.   Cardiovascular: Normal rate, regular rhythm. Grossly normal heart sounds.  Good peripheral circulation. Respiratory: Normal respiratory effort.  No retractions. Lungs CTAB. Gastrointestinal: Soft and nontender. No distention.  No CVA tenderness. Musculoskeletal: Examination of the lower back there is no gross deformity however there is moderate tenderness on palpation bilateral sacral area and paravertebral muscles.  Patient range of motion is guarded secondary to discomfort. Neurologic:  Normal speech and language. No gross focal neurologic deficits are appreciated. No gait instability. Skin:  Skin is warm, dry and intact. No rash noted. Psychiatric: Mood and affect are normal. Speech and behavior are normal.  ____________________________________________   LABS (all labs ordered are listed, but only abnormal results are displayed)  Labs Reviewed - No data to display  RADIOLOGY  Deferred    PROCEDURES  Procedure(s) performed: None  Procedures  Critical Care performed: No  ____________________________________________   INITIAL IMPRESSION / ASSESSMENT AND PLAN / ED COURSE  As part of my medical decision making, I reviewed the following data within the electronic MEDICAL RECORD NUMBER Notes from prior ED visits and [place] Controlled Substance Database  ----------------------------------------- 1:20 PM on 01/15/2018 ----------------------------------------- Patient currently with less back pain and able to move with decreased difficulty.  Patient improved with  medication.  With no history of injury x-rays were deferred at this time.  Patient was discharged to continue taking ibuprofen at home.  She is also given a prescription for Robaxin 500 mg 4 times daily as needed for muscle spasms and Norco as needed for pain.  She is aware that both medications could cause drowsiness and increase her risk for injury. ____________________________________________   FINAL CLINICAL IMPRESSION(S) / ED DIAGNOSES  Final diagnoses:  Strain of lumbar region, initial encounter     ED Discharge Orders        Ordered    methocarbamol (ROBAXIN) 500 MG tablet  4 times daily     01/15/18 1321    HYDROcodone-acetaminophen (NORCO/VICODIN) 5-325 MG tablet  Every 6 hours PRN     01/15/18 1321       Note:  This document was prepared using Dragon voice recognition software and may include unintentional dictation errors.    Letitia Neri  L, PA-C 01/15/18 1345    Nance Pear, MD 01/15/18 1406

## 2018-01-15 NOTE — Discharge Instructions (Signed)
Follow-up with your primary care doctor or [facility] acute care if any continued problems with your back.  Continue taking medication as directed only.  Norco 1 every 6 hours as needed for moderate pain and Robaxin 4 times a day.  You had both these medications while in the emergency department.  You may also continue taking ibuprofen 3 times a day with food.  Use ice or heat to your back as needed for discomfort.  Do not drive or operate machinery while taking the muscle relaxant and pain medication.

## 2018-02-15 ENCOUNTER — Telehealth: Payer: Self-pay | Admitting: Internal Medicine

## 2018-02-15 NOTE — Telephone Encounter (Signed)
error 

## 2018-02-15 NOTE — Telephone Encounter (Signed)
Patient does not want to make an appt. Pt will call back if sx get worse.

## 2018-02-15 NOTE — Telephone Encounter (Signed)
Patient calling  Has had a terrible time with a cough Has noticed a lot of flem, has a runny nose and wheezing Would like to speak with nurse and would like to see if a prescription can be sent in to help Does not want to schedule an appointment at this time Please call to discuss

## 2018-04-10 ENCOUNTER — Other Ambulatory Visit: Payer: Self-pay | Admitting: Internal Medicine

## 2018-04-13 ENCOUNTER — Telehealth: Payer: Self-pay | Admitting: *Deleted

## 2018-04-13 DIAGNOSIS — Z122 Encounter for screening for malignant neoplasm of respiratory organs: Secondary | ICD-10-CM

## 2018-04-13 NOTE — Telephone Encounter (Signed)
Received a referral for initial lung cancer screening scan.  Patient called back and was interested now in the lung screening scan.  Obtained their smoking history, former smoker, quit about 10 years ago, 43 pack year history, as well as answering questions related to screening process.  Patient denies signs of lung cancer such as weight loss or hemoptysis at this time.  Patient denies comorbidity that would prevent curative treatment if lung cancer were found.  Patient is scheduled for the Shared Decision Making Visit and CT scan on 05-12-18 at 1330.

## 2018-04-28 ENCOUNTER — Emergency Department
Admission: EM | Admit: 2018-04-28 | Discharge: 2018-04-28 | Disposition: A | Discharge status: Home / Self Care | Payer: Medicare Other | Attending: Emergency Medicine | Admitting: Emergency Medicine

## 2018-04-28 ENCOUNTER — Encounter: Payer: Self-pay | Admitting: Emergency Medicine

## 2018-04-28 ENCOUNTER — Other Ambulatory Visit: Payer: Self-pay

## 2018-04-28 DIAGNOSIS — R51 Headache: Secondary | ICD-10-CM | POA: Diagnosis not present

## 2018-04-28 DIAGNOSIS — Z79899 Other long term (current) drug therapy: Secondary | ICD-10-CM | POA: Insufficient documentation

## 2018-04-28 DIAGNOSIS — R519 Headache, unspecified: Secondary | ICD-10-CM

## 2018-04-28 DIAGNOSIS — E860 Dehydration: Secondary | ICD-10-CM

## 2018-04-28 DIAGNOSIS — J449 Chronic obstructive pulmonary disease, unspecified: Secondary | ICD-10-CM | POA: Insufficient documentation

## 2018-04-28 DIAGNOSIS — Z87891 Personal history of nicotine dependence: Secondary | ICD-10-CM | POA: Diagnosis not present

## 2018-04-28 DIAGNOSIS — Z85828 Personal history of other malignant neoplasm of skin: Secondary | ICD-10-CM | POA: Insufficient documentation

## 2018-04-28 DIAGNOSIS — R112 Nausea with vomiting, unspecified: Secondary | ICD-10-CM

## 2018-04-28 LAB — COMPREHENSIVE METABOLIC PANEL
ALT: 11 U/L (ref 0–44)
AST: 23 U/L (ref 15–41)
Albumin: 4 g/dL (ref 3.5–5.0)
Alkaline Phosphatase: 76 U/L (ref 38–126)
Anion gap: 7 (ref 5–15)
BUN: 14 mg/dL (ref 8–23)
CO2: 27 mmol/L (ref 22–32)
Calcium: 9.1 mg/dL (ref 8.9–10.3)
Chloride: 106 mmol/L (ref 98–111)
Creatinine, Ser: 0.55 mg/dL (ref 0.44–1.00)
GFR calc Af Amer: >60 mL/min (ref >60)
GFR calc non Af Amer: >60 mL/min (ref >60)
Glucose, Bld: 126 mg/dL — ABNORMAL HIGH (ref 70–99)
Potassium: 4.8 mmol/L (ref 3.5–5.1)
Sodium: 140 mmol/L (ref 135–145)
Total Bilirubin: 1.2 mg/dL (ref 0.3–1.2)
Total Protein: 6.4 g/dL — ABNORMAL LOW (ref 6.5–8.1)

## 2018-04-28 LAB — CBC
HCT: 43.5 % (ref 35.0–47.0)
Hemoglobin: 14.5 g/dL (ref 12.0–16.0)
MCH: 29.1 pg (ref 26.0–34.0)
MCHC: 33.4 g/dL (ref 32.0–36.0)
MCV: 87.1 fL (ref 80.0–100.0)
Platelets: 176 10*3/uL (ref 150–440)
RBC: 4.99 MIL/uL (ref 3.80–5.20)
RDW: 13.8 % (ref 11.5–14.5)
WBC: 6.5 10*3/uL (ref 3.6–11.0)

## 2018-04-28 MED ORDER — ONDANSETRON HCL 4 MG/2ML IJ SOLN: 4.0000 mg | Freq: Once | INTRAMUSCULAR | Status: DC | PRN

## 2018-04-28 MED ORDER — LORATADINE 10 MG PO TABS
10.0000 mg | ORAL_TABLET | Freq: Once | ORAL | Status: AC
  Administered 2018-04-28: 10 mg via ORAL
  Filled 2018-04-28: qty 1

## 2018-04-28 MED ORDER — PSEUDOEPHEDRINE HCL 30 MG PO TABS
60.0000 mg | ORAL_TABLET | Freq: Once | ORAL | Status: AC
  Administered 2018-04-28: 60 mg via ORAL
  Filled 2018-04-28 (×2): qty 2

## 2018-04-28 MED ORDER — ONDANSETRON 4 MG PO TBDP: 4.0000 mg | ORAL_TABLET | Freq: Three times a day (TID) | ORAL | 0 refills | Status: DC | PRN

## 2018-04-28 MED ORDER — DEXAMETHASONE SODIUM PHOSPHATE 10 MG/ML IJ SOLN
10.0000 mg | Freq: Once | INTRAMUSCULAR | Status: AC
  Administered 2018-04-28: 10 mg via INTRAVENOUS
  Filled 2018-04-28: qty 1

## 2018-04-28 MED ORDER — SODIUM CHLORIDE 0.9 % IV BOLUS
1000.0000 mL | Freq: Once | INTRAVENOUS | Status: AC
  Administered 2018-04-28: 1000 mL via INTRAVENOUS

## 2018-04-28 MED ORDER — ONDANSETRON 4 MG PO TBDP
8.0000 mg | ORAL_TABLET | Freq: Once | ORAL | Status: AC
  Administered 2018-04-28: 8 mg via ORAL
  Filled 2018-04-28: qty 2

## 2018-04-28 MED ORDER — IBUPROFEN 600 MG PO TABS
600.0000 mg | ORAL_TABLET | Freq: Once | ORAL | Status: AC
  Administered 2018-04-28: 600 mg via ORAL
  Filled 2018-04-28: qty 1

## 2018-04-28 MED ORDER — PSEUDOEPHEDRINE HCL 30 MG PO TABS: 30.0000 mg | ORAL_TABLET | Freq: Four times a day (QID) | ORAL | 0 refills | Status: AC | PRN

## 2018-04-28 MED ORDER — ACETAMINOPHEN 500 MG PO TABS
1000.0000 mg | ORAL_TABLET | Freq: Once | ORAL | Status: AC
  Administered 2018-04-28: 1000 mg via ORAL
  Filled 2018-04-28: qty 2

## 2018-04-28 MED ORDER — OXYMETAZOLINE HCL 0.05 % NA SOLN
1.0000 | Freq: Once | NASAL | Status: DC
  Filled 2018-04-28: qty 15

## 2018-04-28 NOTE — Discharge Instructions (Signed)
Please take Sudafed around-the-clock as needed for congestion in addition to Zyrtec (cetirizine) 10 mg 3 times a day to help with your congestion.  Follow-up with your primary care physician as needed.  Fortunately today you did not need antibiotics.  Please return should you develop a fever, chills, worsening pain, or for any other issues.  It was a pleasure to take care of you today, and thank you for coming to our emergency department.  If you have any questions or concerns before leaving please ask the nurse to grab me and I'm more than happy to go through your aftercare instructions again.  If you were prescribed any opioid pain medication today such as Norco, Vicodin, Percocet, morphine, hydrocodone, or oxycodone please make sure you do not drive when you are taking this medication as it can alter your ability to drive safely.  If you have any concerns once you are home that you are not improving or are in fact getting worse before you can make it to your follow-up appointment, please do not hesitate to call 911 and come back for further evaluation.  Darel Hong, MD  Results for orders placed or performed during the hospital encounter of 04/28/18  Comprehensive metabolic panel  Result Value Ref Range   Sodium 140 135 - 145 mmol/L   Potassium 4.8 3.5 - 5.1 mmol/L   Chloride 106 98 - 111 mmol/L   CO2 27 22 - 32 mmol/L   Glucose, Bld 126 (H) 70 - 99 mg/dL   BUN 14 8 - 23 mg/dL   Creatinine, Ser 0.55 0.44 - 1.00 mg/dL   Calcium 9.1 8.9 - 10.3 mg/dL   Total Protein 6.4 (L) 6.5 - 8.1 g/dL   Albumin 4.0 3.5 - 5.0 g/dL   AST 23 15 - 41 U/L   ALT 11 0 - 44 U/L   Alkaline Phosphatase 76 38 - 126 U/L   Total Bilirubin 1.2 0.3 - 1.2 mg/dL   GFR calc non Af Amer >60 >60 mL/min   GFR calc Af Amer >60 >60 mL/min   Anion gap 7 5 - 15  CBC  Result Value Ref Range   WBC 6.5 3.6 - 11.0 K/uL   RBC 4.99 3.80 - 5.20 MIL/uL   Hemoglobin 14.5 12.0 - 16.0 g/dL   HCT 43.5 35.0 - 47.0 %   MCV 87.1  80.0 - 100.0 fL   MCH 29.1 26.0 - 34.0 pg   MCHC 33.4 32.0 - 36.0 g/dL   RDW 13.8 11.5 - 14.5 %   Platelets 176 150 - 440 K/uL

## 2018-04-28 NOTE — ED Triage Notes (Signed)
Patient reports headache that began yesterday. States she has been having sinus issues last few days as well. Has been vomiting today approx 4 times today, which she states was contained a lot of mucous. +Nausea currently.

## 2018-04-28 NOTE — ED Notes (Signed)
ED Provider at bedside. 

## 2018-04-28 NOTE — ED Provider Notes (Signed)
[facility] Emergency Department Provider Note  ____________________________________________   First MD Initiated Contact with Patient 04/28/18 [phone]     (approximate)  I have reviewed the triage vital signs and the nursing notes.   HISTORY  Chief Complaint Headache and Emesis   HPI Kelli Phillips is a 67 y.o. female who self presents the emergency department with sinus congestion, rhinorrhea, frontal headache, nausea, vomiting.  Her symptoms began yesterday.  There were insidious onset slowly progressive and are now mostly constant.  She said she feels a tremendous "fullness" in her face.  She is unable to use any nasal spray secondary to previous sinus surgery.  She denies fevers or chills.  Her nasal discharge is not purulent.  Her headache was gradual onset moderate severity pressure-like and nothing seems to make it better or worse.  She came to the emergency department today because she has been "swallowing mucus" which is made her vomit and she is had difficulty keeping food down and she is worried she is dehydrated.    Past Medical History:  Diagnosis Date  . 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 COPD (chronic obstructive pulmonary disease) [facility])     Patient Active Problem List   Diagnosis Date Noted  . Adenomatous colon polyp 08/14/2014  . Midline low back pain with sciatica 08/14/2014  . Pulmonary emphysema ([place]) 08/14/2014  . 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 Rectal bleeding 01/30/2014    Past Surgical History:  Procedure Laterality Date  . CESAREAN SECTION    . COLON SURGERY      Prior to Admission medications   Medication Sig Start Date End Date Taking? Authorizing Provider  albuterol (PROVENTIL HFA;VENTOLIN HFA) 108 (90 Base) MCG/ACT inhaler TAKE 2 PUFFS BY MOUTH EVERY 6 HOURS AS NEEDED FOR WHEEZE OR SHORTNESS OF BREATH 04/11/18   Flora Lipps, MD  ANORO ELLIPTA 62.5-25 MCG/INH AEPB Inhale 1 puff into the lungs daily. 09/27/17    Flora Lipps, MD  azithromycin (ZITHROMAX Z-PAK) 250 MG tablet Take 2 tablets on Day 1 and then 1 tablet daily till gone. 01/03/18   Flora Lipps, MD  cyclobenzaprine (FLEXERIL) 10 MG tablet Take 1 tablet (10 mg total) by mouth 3 (three) times daily as needed. 12/07/16   Sable Feil, PA-C  doxycycline (VIBRA-TABS) 100 MG tablet Take 1 tablet (100 mg total) by mouth 2 (two) times daily. 10/08/17   Flora Lipps, MD  fluticasone (FLONASE) 50 MCG/ACT nasal spray Place 2 sprays into both nostrils daily. 04/23/17 04/23/18  Wilhelmina Mcardle, MD  Glycopyrrolate-Formoterol (BEVESPI AEROSPHERE) 9-4.8 MCG/ACT AERO Inhale 2 puffs into the lungs 2 (two) times daily. 11/11/17   Flora Lipps, MD  HYDROcodone-acetaminophen (NORCO/VICODIN) 5-325 MG tablet Take 1 tablet by mouth every 6 (six) hours as needed for moderate pain. 01/15/18   Johnn Hai, PA-C  methocarbamol (ROBAXIN) 500 MG tablet Take 1 tablet (500 mg total) by mouth 4 (four) times daily. 01/15/18   Johnn Hai, PA-C  naproxen (NAPROSYN) 500 MG tablet Take 1 tablet (500 mg total) by mouth 2 (two) times daily with a meal. 02/06/16   Hagler, Jami L, PA-C  ondansetron (ZOFRAN ODT) 4 MG disintegrating tablet Take 1 tablet (4 mg total) by mouth every 8 (eight) hours as needed for nausea or vomiting. 04/28/18   Darel Hong, MD  pseudoephedrine (SUDAFED) 30 MG tablet Take 1 tablet (30 mg total) by mouth every 6 (six) hours as needed for congestion.  04/28/18 04/28/19  Darel Hong, MD  traMADol (ULTRAM) 50 MG tablet Take 1 tablet (50 mg total) by mouth every 12 (twelve) hours as needed. 12/07/16   Sable Feil, PA-C    Allergies Penicillins and Sulfa antibiotics  Family History  Problem Relation Age of Onset  . Breast cancer Paternal Aunt        ?    Social History Social History   Tobacco Use  . Smoking status: Former Research scientist (life sciences)  . Smokeless tobacco: Never Used  Substance Use Topics  . Alcohol use: No  . Drug use: No    Review of  Systems Constitutional: No fever/chills Eyes: No visual changes. ENT: Positive for rhinorrhea and sinus congestion Cardiovascular: Denies chest pain. Respiratory: Denies shortness of breath. Gastrointestinal: Positive for abdominal pain.  Positive for nausea, positive for vomiting.  No diarrhea.  No constipation. Genitourinary: Negative for dysuria. Musculoskeletal: Negative for back pain. Skin: Negative for rash. Neurological: Positive for headache   ____________________________________________   PHYSICAL EXAM:  VITAL SIGNS: ED Triage Vitals  Enc Vitals Group     BP 04/28/18 0036 134/83     Pulse Rate 04/28/18 0036 85     Resp 04/28/18 0036 18     Temp 04/28/18 0036 97.7 F (36.5 C)     Temp Source 04/28/18 0036 Oral     SpO2 04/28/18 0036 93 %     Weight 04/28/18 0037 149 lb (67.6 kg)     Height 04/28/18 0037 5\' 4"  (1.626 m)     Head Circumference --      Peak Flow --      Pain Score 04/28/18 0036 2     Pain Loc --      Pain Edu? --      Excl. in [place]? --     Constitutional: Alert and oriented x4 sitting up and appears uncomfortable although nontoxic no diaphoresis Eyes: PERRL EOMI. Head: Atraumatic. Nose: Positive for rhinorrhea and sinus tenderness Mouth/Throat: No trismus Neck: No stridor.   Cardiovascular: Normal rate, regular rhythm. Grossly normal heart sounds.  Good peripheral circulation. Respiratory: Normal respiratory effort.  No retractions. Lungs CTAB and moving good air Gastrointestinal: Soft nontender no peritonitis Musculoskeletal: No lower extremity edema   Neurologic:  Normal speech and language. No gross focal neurologic deficits are appreciated. Skin:  Skin is warm, dry and intact. No rash noted. Psychiatric: Mood and affect are normal. Speech and behavior are normal.    ____________________________________________   DIFFERENTIAL includes but not limited to  Sinus congestion, sinusitis, dehydration, metabolic  derangement ____________________________________________   LABS (all labs ordered are listed, but only abnormal results are displayed)  Labs Reviewed  COMPREHENSIVE METABOLIC PANEL - Abnormal; Notable for the following components:      Result Value   Glucose, Bld 126 (*)    Total Protein 6.4 (*)    All other components within normal limits  CBC    Lab work reviewed by me with no acute disease __________________________________________  EKG   ____________________________________________  RADIOLOGY   ____________________________________________   PROCEDURES  Procedure(s) performed: no  Procedures  Critical Care performed: no  ____________________________________________   INITIAL IMPRESSION / ASSESSMENT AND PLAN / ED COURSE  Pertinent labs & imaging results that were available during my care of the patient were reviewed by me and considered in my medical decision making (see chart for details).   As part of my medical decision making, I reviewed the following data within the [place] History  obtained from family if available, nursing notes, old chart and ekg, as well as notes from prior ED visits.  Patient arrives with copious rhinorrhea and sinus congestion followed by nausea and vomiting.  Given a liter of IV fluids and some Zofran along with dexamethasone and Sudafed with improvement in her symptoms.  I have advised her to use Sudafed and Zofran as needed for severe symptoms and to follow-up with her primary care physician.  Discharged home in improved condition.      ____________________________________________   FINAL CLINICAL IMPRESSION(S) / ED DIAGNOSES  Final diagnoses:  Sinus headache  Non-intractable vomiting with nausea, unspecified vomiting type  Dehydration      NEW MEDICATIONS STARTED DURING THIS VISIT:  New Prescriptions   ONDANSETRON (ZOFRAN ODT) 4 MG DISINTEGRATING TABLET    Take 1 tablet (4 mg total) by mouth every  8 (eight) hours as needed for nausea or vomiting.   PSEUDOEPHEDRINE (SUDAFED) 30 MG TABLET    Take 1 tablet (30 mg total) by mouth every 6 (six) hours as needed for congestion.     Note:  This document was prepared using Dragon voice recognition software and may include unintentional dictation errors.     Darel Hong, MD 04/28/18 [phone]

## 2018-05-12 ENCOUNTER — Ambulatory Visit
Admission: RE | Admit: 2018-05-12 | Discharge: 2018-05-12 | Disposition: A | Discharge status: Home / Self Care | Payer: Medicare Other | Source: Ambulatory Visit | Attending: Nurse Practitioner | Admitting: Nurse Practitioner

## 2018-05-12 ENCOUNTER — Encounter: Payer: Self-pay | Admitting: Nurse Practitioner

## 2018-05-12 ENCOUNTER — Inpatient Hospital Stay: Payer: Medicare Other | Attending: Nurse Practitioner | Admitting: Nurse Practitioner

## 2018-05-12 DIAGNOSIS — Z87891 Personal history of nicotine dependence: Secondary | ICD-10-CM | POA: Diagnosis not present

## 2018-05-12 DIAGNOSIS — Z122 Encounter for screening for malignant neoplasm of respiratory organs: Secondary | ICD-10-CM | POA: Insufficient documentation

## 2018-05-12 DIAGNOSIS — I2510 Atherosclerotic heart disease of native coronary artery without angina pectoris: Secondary | ICD-10-CM | POA: Insufficient documentation

## 2018-05-12 DIAGNOSIS — J432 Centrilobular emphysema: Secondary | ICD-10-CM | POA: Diagnosis not present

## 2018-05-12 DIAGNOSIS — I700 Atherosclerosis of aorta: Secondary | ICD-10-CM | POA: Insufficient documentation

## 2018-05-12 NOTE — Progress Notes (Signed)
In accordance with CMS guidelines, patient has met eligibility criteria including age, absence of signs or symptoms of lung cancer.  Social History   Tobacco Use  . Smoking status: Former Smoker    Packs/day: 1.00    Years: 43.00    Pack years: 43.00    Types: Cigarettes    Last attempt to quit: 2009    Years since quitting: 10.7  . Smokeless tobacco: Never Used  Substance Use Topics  . Alcohol use: No  . Drug use: No      A shared decision-making session was conducted prior to the performance of CT scan. This includes one or more decision aids, includes benefits and harms of screening, follow-up diagnostic testing, over-diagnosis, false positive rate, and total radiation exposure.   Counseling on the importance of adherence to annual lung cancer LDCT screening, impact of co-morbidities, and ability or willingness to undergo diagnosis and treatment is imperative for compliance of the program.   Counseling on the importance of continued smoking cessation for former smokers; the importance of smoking cessation for current smokers, and information about tobacco cessation interventions have been given to patient including [place] and [employer] quit [place] programs.   Written order for lung cancer screening with LDCT has been given to the patient and any and all questions have been answered to the best of my abilities.    Yearly follow up will be coordinated by Burgess Estelle, Thoracic Navigator.  Beckey Rutter, DNP, AGNP-C [place] [facility] [phone] (work cell) [phone] (office) 05/12/18 2:22 PM

## 2018-05-13 ENCOUNTER — Encounter: Payer: Self-pay | Admitting: *Deleted

## 2018-06-12 ENCOUNTER — Other Ambulatory Visit: Payer: Self-pay | Admitting: Internal Medicine

## 2018-06-22 ENCOUNTER — Encounter: Payer: Self-pay | Admitting: Pulmonary Disease

## 2018-06-22 ENCOUNTER — Ambulatory Visit (INDEPENDENT_AMBULATORY_CARE_PROVIDER_SITE_OTHER): Payer: Medicare Other | Admitting: Pulmonary Disease

## 2018-06-22 ENCOUNTER — Ambulatory Visit: Payer: Medicare Other | Admitting: Internal Medicine

## 2018-06-22 VITALS — BP 142/78 | HR 82 | Resp 16 | Ht 64.0 in | Wt 136.0 lb

## 2018-06-22 DIAGNOSIS — J329 Chronic sinusitis, unspecified: Secondary | ICD-10-CM | POA: Diagnosis not present

## 2018-06-22 DIAGNOSIS — J449 Chronic obstructive pulmonary disease, unspecified: Secondary | ICD-10-CM | POA: Diagnosis not present

## 2018-06-22 MED ORDER — AZITHROMYCIN 250 MG PO TABS: ORAL_TABLET | ORAL | 0 refills | Status: AC

## 2018-06-22 MED ORDER — MONTELUKAST SODIUM 10 MG PO TABS: 10.0000 mg | ORAL_TABLET | Freq: Every day | ORAL | 2 refills | Status: DC

## 2018-06-22 NOTE — Patient Instructions (Signed)
1) You will be prescribed a Z pak, take until finished  2) You will start Singulair (montelukast) one tablet daily for sinus issues  3) You will be referred to ENT (Ear, nose and throat) to evaluate your recurring issues.  4) Follow up with Dr. Mortimer Fries as scheduled

## 2018-06-22 NOTE — Progress Notes (Signed)
amb ref °

## 2018-06-23 ENCOUNTER — Encounter: Payer: Self-pay | Admitting: Pulmonary Disease

## 2018-06-23 NOTE — Progress Notes (Signed)
   Subjective:    Patient ID: Kelli Phillips, female    DOB: [birth year], 67 y.o.   MRN: [id number]  HPI the patient is a 67 year old who states she is a former smoker (she however smells heavily of tobacco) she has presumed COPD and follows with Dr. Mortimer Fries. She was last evaluated by Dr. Mortimer Fries on 27 September 2017. Previously, she had been ordered PFTs but never did these. She is apparently on nocturnal oxygen for nocturnal hypoxemia. She presents today with the complaint of having an acute flareup of her chronic sinusitis. She has not had evaluation by ENT for this issue. She does not describe any fevers, chills or sweats. He has had cough productive of yellowish to pale green sputum. She notes nasal congestion and sinus pain. She does not follow nasal hygiene.  She is currently on  Anoro Ellipta once daily and albuterol as needed. She states that these medications work well. Has not had any lower extremity edema, orthopnea or paroxysmal nocturnal dyspnea.   Review of Systems  Constitutional: Negative for chills and fever.  HENT: Positive for congestion, sinus pressure and sinus pain.   Eyes: Negative.   Respiratory: Positive for cough. Negative for shortness of breath and wheezing.   Cardiovascular: Negative.   Gastrointestinal: Negative.   Neurological: Negative.   All other systems reviewed and are negative.      Objective:   Physical Exam  Constitutional: She is oriented to person, place, and time. She appears well-developed and well-nourished. No distress.  HENT:  Head: Normocephalic and atraumatic.  Poor dentition, clear postnasal drip noted.  Eyes: Pupils are equal, round, and reactive to light. Conjunctivae are normal. No scleral icterus.  Neck: Neck supple. No JVD present. No tracheal deviation present. No thyromegaly present.  Cardiovascular: Normal rate, regular rhythm, normal heart sounds and intact distal pulses.  No murmur heard. Pulmonary/Chest: Effort normal. No respiratory  distress. She has no wheezes. She has no rales.  Coarse breath sounds.  Abdominal: Soft. She exhibits no distension.  Musculoskeletal: She exhibits no edema.  Lymphadenopathy:    She has no cervical adenopathy.  Neurological: She is alert and oriented to person, place, and time.  Skin: Skin is warm and dry. She is not diaphoretic.  Psychiatric: She has a normal mood and affect. Her behavior is normal.  Nursing note and vitals reviewed.         Assessment & Plan:   1) Acute exacerbation of chronic sinusitis: appears that most of the patient visits and telephone calls are related to exacerbation of this issue. We will give her azithromycin dosage pack as she states that this helps her. I have recommended that she engage in nasal hygiene, will also start montelukast for issues with chronic perennial rhinitis. And that she has repeated problems with this she should be referred to ENT and this has been done today.  2) COPD: she does not appear to have exacerbation today. Continue her inhalers as prescribed.  Recommend follow-up with Dr. Mortimer Fries in 2 to 3 weeks time she is to contact us prior to that time should any new difficulties arise.

## 2018-06-27 ENCOUNTER — Telehealth: Payer: Self-pay | Admitting: Cardiovascular Disease

## 2018-06-27 NOTE — Telephone Encounter (Signed)
Attempted twice to contact patient. Our office does not do pain medications. Not sure if the was directed to correct dept. Note will be closed.

## 2018-06-27 NOTE — Telephone Encounter (Signed)
Patient calling to check status of pain medication request at recent ov.

## 2018-06-28 NOTE — Telephone Encounter (Signed)
Patient calling  Patient was informed and made aware we do not prescribed any pain medications

## 2018-06-28 NOTE — Telephone Encounter (Signed)
NOTED

## 2018-08-02 ENCOUNTER — Telehealth: Payer: Self-pay | Admitting: Pulmonary Disease

## 2018-08-02 MED ORDER — GLYCOPYRROLATE-FORMOTEROL 9-4.8 MCG/ACT IN AERO: 2.0000 | INHALATION_SPRAY | Freq: Two times a day (BID) | RESPIRATORY_TRACT | 2 refills | Status: DC

## 2018-08-02 NOTE — Telephone Encounter (Signed)
°*  STAT* If patient is at the pharmacy, call can be transferred to refill team.   1. Which medications need to be refilled? (please list name of each medication and dose if known) brevespi   2. Which pharmacy/location (including street and city if local pharmacy) is medication to be sent to?[facility]    3. Do they need a 30 day or 90 day supply? [place]

## 2018-08-02 NOTE — Telephone Encounter (Signed)
rx submitted.  

## 2018-08-19 ENCOUNTER — Telehealth: Payer: Self-pay | Admitting: Internal Medicine

## 2018-08-19 NOTE — Telephone Encounter (Signed)
Spoke to patient, notified her we do not have any providers in the office this afternoon to send in antibiotics. Suggested she call her PCP if she is that bad. Stated she will wait. Nothing further needed at this time.

## 2018-09-06 ENCOUNTER — Ambulatory Visit: Payer: Medicare Other | Admitting: Cardiovascular Disease

## 2018-09-10 ENCOUNTER — Other Ambulatory Visit: Payer: Self-pay | Admitting: Internal Medicine

## 2018-10-15 ENCOUNTER — Other Ambulatory Visit: Payer: Self-pay | Admitting: Pulmonary Disease

## 2018-11-11 ENCOUNTER — Other Ambulatory Visit: Payer: Self-pay | Admitting: Family Medicine

## 2018-11-11 DIAGNOSIS — Z1231 Encounter for screening mammogram for malignant neoplasm of breast: Secondary | ICD-10-CM

## 2018-12-26 ENCOUNTER — Other Ambulatory Visit: Payer: Self-pay | Admitting: Internal Medicine

## 2019-03-16 ENCOUNTER — Other Ambulatory Visit: Payer: Self-pay | Admitting: Internal Medicine

## 2019-03-22 ENCOUNTER — Other Ambulatory Visit: Payer: Self-pay

## 2019-03-22 ENCOUNTER — Encounter: Payer: Self-pay | Admitting: Emergency Medicine

## 2019-03-22 DIAGNOSIS — Z87891 Personal history of nicotine dependence: Secondary | ICD-10-CM | POA: Insufficient documentation

## 2019-03-22 DIAGNOSIS — R112 Nausea with vomiting, unspecified: Secondary | ICD-10-CM | POA: Diagnosis present

## 2019-03-22 DIAGNOSIS — J449 Chronic obstructive pulmonary disease, unspecified: Secondary | ICD-10-CM | POA: Insufficient documentation

## 2019-03-22 DIAGNOSIS — R1111 Vomiting without nausea: Secondary | ICD-10-CM | POA: Diagnosis not present

## 2019-03-22 DIAGNOSIS — Z79899 Other long term (current) drug therapy: Secondary | ICD-10-CM | POA: Insufficient documentation

## 2019-03-22 DIAGNOSIS — Z85828 Personal history of other malignant neoplasm of skin: Secondary | ICD-10-CM | POA: Insufficient documentation

## 2019-03-22 LAB — BASIC METABOLIC PANEL
Anion gap: 6 (ref 5–15)
BUN: 13 mg/dL (ref 8–23)
CO2: 27 mmol/L (ref 22–32)
Calcium: 8.9 mg/dL (ref 8.9–10.3)
Chloride: 108 mmol/L (ref 98–111)
Creatinine, Ser: 0.59 mg/dL (ref 0.44–1.00)
GFR calc Af Amer: >60 mL/min (ref >60)
GFR calc non Af Amer: >60 mL/min (ref >60)
Glucose, Bld: 125 mg/dL — ABNORMAL HIGH (ref 70–99)
Potassium: 4.2 mmol/L (ref 3.5–5.1)
Sodium: 141 mmol/L (ref 135–145)

## 2019-03-22 LAB — CBC
HCT: 42.6 % (ref 36.0–46.0)
Hemoglobin: 14 g/dL (ref 12.0–15.0)
MCH: 29 pg (ref 26.0–34.0)
MCHC: 32.9 g/dL (ref 30.0–36.0)
MCV: 88.2 fL (ref 80.0–100.0)
Platelets: 213 10*3/uL (ref 150–400)
RBC: 4.83 MIL/uL (ref 3.87–5.11)
RDW: 12.6 % (ref 11.5–15.5)
WBC: 6.3 10*3/uL (ref 4.0–10.5)
nRBC: 0 % (ref 0.0–0.2)

## 2019-03-22 MED ORDER — SODIUM CHLORIDE 0.9% FLUSH: 3.0000 mL | Freq: Once | INTRAVENOUS | Status: DC

## 2019-03-22 NOTE — ED Triage Notes (Signed)
Pt presents to ED via EMS from home with weakness and vomiting for the past hour. Pt states she has vomited approx 6 times. Pt reports feeling weak and dizzy when she started to stand up for EMS.

## 2019-03-23 ENCOUNTER — Emergency Department
Admission: EM | Admit: 2019-03-23 | Discharge: 2019-03-23 | Disposition: A | Discharge status: Home / Self Care | Payer: Medicare Other | Attending: Emergency Medicine | Admitting: Emergency Medicine

## 2019-03-23 ENCOUNTER — Emergency Department: Payer: Medicare Other

## 2019-03-23 DIAGNOSIS — R1111 Vomiting without nausea: Secondary | ICD-10-CM | POA: Diagnosis not present

## 2019-03-23 LAB — TROPONIN I (HIGH SENSITIVITY): Troponin I (High Sensitivity): <2 ng/L (ref <18)

## 2019-03-23 MED ORDER — AZITHROMYCIN 250 MG PO TABS: ORAL_TABLET | ORAL | 0 refills | Status: AC

## 2019-03-23 MED ORDER — SODIUM CHLORIDE 0.9 % IV BOLUS
1000.0000 mL | Freq: Once | INTRAVENOUS | Status: AC
  Administered 2019-03-23: 1000 mL via INTRAVENOUS

## 2019-03-23 MED ORDER — TRAMADOL HCL 50 MG PO TABS
50.0000 mg | ORAL_TABLET | Freq: Once | ORAL | Status: AC
  Administered 2019-03-23: 50 mg via ORAL
  Filled 2019-03-23: qty 1

## 2019-03-23 NOTE — ED Notes (Signed)
Pt refusing Head CT. When Pam, CT tech, arrived to take pt to imaging, pt immediately refused and stated she would have her PCP order one "if he thinks I really need it". Pt encouraged to consent to testing d/t c/o headache and presenting s/x's, but pt continues to refuse. Dr Owens Shark made aware and pt will be d/c'd at this time.

## 2019-03-23 NOTE — ED Notes (Addendum)
Pt reports a h/a and vomiting for 1 day.  Pt reports a frontal type headache which has improved with rx meds.  Pt took zofran and n/v has improved.  Pt alert  Speech clear.  Pt ambulates without diff.  Pt on oxygen at home as needed for copd.  Iv in place.  Pt brought in via ems from home.

## 2019-03-23 NOTE — ED Notes (Signed)
Report off to daniel rn  

## 2019-03-23 NOTE — ED Provider Notes (Addendum)
[facility] Emergency Department Provider Note    First MD Initiated Contact with Patient 03/23/19 0222     (approximate)  I have reviewed the triage vital signs and the nursing notes.   HISTORY  Chief Complaint Emesis  HPI Kelli Phillips is a 68 y.o. female with below list of previous medical conditions presents emergency department acute onset of nausea and vomiting after taking doxycycline for the first time tonight with prednisone.  Patient was prescribed beforementioned secondary to sinusitis by her primary care provider.  Patient states that she has Zofran at home which she took and symptoms have since proved.  Patient denies any abdominal pain.  Patient denies any urinary symptoms.  Patient denies any fever.        Past Medical History:  Diagnosis Date  . 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 COPD (chronic obstructive pulmonary disease) [facility])     Patient Active Problem List   Diagnosis Date Noted  . Personal history of tobacco use, presenting hazards to health 05/12/2018  . Adenomatous colon polyp 08/14/2014  . Midline low back pain with sciatica 08/14/2014  . Pulmonary emphysema ([place]) 08/14/2014  . COPD, very severe ([place]) 05/16/2014  . Rectal bleeding 01/30/2014    Past Surgical History:  Procedure Laterality Date  . CESAREAN SECTION    . COLON SURGERY      Prior to Admission medications   Medication Sig Start Date End Date Taking? Authorizing Provider  albuterol (VENTOLIN HFA) 108 (90 Base) MCG/ACT inhaler TAKE 2 PUFFS BY MOUTH EVERY 6 HOURS AS NEEDED FOR WHEEZE OR SHORTNESS OF BREATH 12/26/18   Flora Lipps, MD  cyclobenzaprine (FLEXERIL) 10 MG tablet Take 1 tablet (10 mg total) by mouth 3 (three) times daily as needed. 12/07/16   Sable Feil, PA-C  Glycopyrrolate-Formoterol (BEVESPI AEROSPHERE) 9-4.8 MCG/ACT AERO Inhale 2 puffs into the lungs 2 (two) times daily. 08/02/18   Tyler Pita, MD  montelukast (SINGULAIR) 10 MG  tablet TAKE 1 TABLET BY MOUTH EVERY DAY 10/17/18   Tyler Pita, MD  naproxen (NAPROSYN) 500 MG tablet Take 1 tablet (500 mg total) by mouth 2 (two) times daily with a meal. 02/06/16   Hagler, Jami L, PA-C  ondansetron (ZOFRAN ODT) 4 MG disintegrating tablet Take 1 tablet (4 mg total) by mouth every 8 (eight) hours as needed for nausea or vomiting. 04/28/18   Darel Hong, MD  pseudoephedrine (SUDAFED) 30 MG tablet Take 1 tablet (30 mg total) by mouth every 6 (six) hours as needed for congestion. 04/28/18 04/28/19  Darel Hong, MD  traMADol (ULTRAM) 50 MG tablet Take 1 tablet (50 mg total) by mouth every 12 (twelve) hours as needed. 12/07/16   Sable Feil, PA-C    Allergies Penicillins and Sulfa antibiotics  Family History  Problem Relation Age of Onset  . Breast cancer Paternal Aunt        ?    Social History Social History   Tobacco Use  . Smoking status: Former Smoker    Packs/day: 1.00    Years: 43.00    Pack years: 43.00    Types: Cigarettes    Quit date: 2009    Years since quitting: 11.5  . Smokeless tobacco: Never Used  Substance Use Topics  . Alcohol use: No  . Drug use: No    Review of Systems Constitutional: No fever/chills Eyes: No visual changes. ENT: No sore throat.  Positive for bilateral facial pain  Cardiovascular: Denies chest pain. Respiratory: Denies shortness of breath. Gastrointestinal: No abdominal pain.  No nausea, no vomiting.  No diarrhea.  No constipation. Genitourinary: Negative for dysuria. Musculoskeletal: Negative for neck pain.  Negative for back pain. Integumentary: Negative for rash. Neurological: Negative for headaches, focal weakness or numbness.   ____________________________________________   PHYSICAL EXAM:  VITAL SIGNS: ED Triage Vitals  Enc Vitals Group     BP 03/22/19 2329 138/80     Pulse Rate 03/22/19 2329 79     Resp 03/22/19 2329 18     Temp 03/22/19 2329 98.1 F (36.7 C)     Temp Source 03/22/19 2329 Oral      SpO2 03/22/19 2329 94 %     Weight 03/22/19 2330 63.5 kg (140 lb)     Height 03/22/19 2330 1.626 m (5\' 4" )     Head Circumference --      Peak Flow --      Pain Score 03/22/19 2329 0     Pain Loc --      Pain Edu? --      Excl. in [place]? --     Constitutional: Alert and oriented. Well appearing and in no acute distress. Eyes: Conjunctivae are normal. PERRL. EOMI. Head: Palpation maxillary and frontal sinuses Mouth/Throat: Mucous membranes are moist.  Oropharynx non-erythematous. Neck: No stridor.   Cardiovascular: Normal rate, regular rhythm. Good peripheral circulation. Grossly normal heart sounds. Respiratory: Normal respiratory effort.  No retractions. No audible wheezing. Gastrointestinal: Soft and nontender. No distention.  Musculoskeletal: No lower extremity tenderness nor edema. No gross deformities of extremities. Neurologic:  Normal speech and language. No gross focal neurologic deficits are appreciated.  Skin:  Skin is warm, dry and intact. No rash noted. Psychiatric: Mood and affect are normal. Speech and behavior are normal.  ____________________________________________   LABS (all labs ordered are listed, but only abnormal results are displayed)  Labs Reviewed  BASIC METABOLIC PANEL - Abnormal; Notable for the following components:      Result Value   Glucose, Bld 125 (*)    All other components within normal limits  CBC  URINALYSIS, COMPLETE (UACMP) WITH MICROSCOPIC  TROPONIN I (HIGH SENSITIVITY)     Procedures  ED ECG REPORT I, [place] BROWN, the attending physician, personally viewed and interpreted this ECG.   Date: 03/22/2019  EKG Time: 11:33 PM  Rate: 78  Rhythm: Normal sinus rhythm  Axis: Normal  Intervals: Normal  ST&T Change: None  ____________________________________________   INITIAL IMPRESSION / MDM / ASSESSMENT AND PLAN / ED COURSE  As part of my medical decision making, I reviewed the following data within the electronic  MEDICAL RECORD NUMBER   68 year old female for physical exam secondary to vomiting after taking doxycycline.  Patient with no abdominal pain and no vomiting at present.  Suspect this to be medication induced.  Patient requested a different prescription for her sinusitis and as such azithromycin was prescribed as the patient states that she has tolerated this in the past      ____________________________________________  FINAL CLINICAL IMPRESSION(S) / ED DIAGNOSES  Final diagnoses:  Non-intractable vomiting without nausea, unspecified vomiting type     MEDICATIONS GIVEN DURING THIS VISIT:  Medications  sodium chloride 0.9 % bolus 1,000 mL (1,000 mLs Intravenous New Bag/Given 03/23/19 6789)     ED Discharge Orders    None      *Please note:  Kelli Phillips was evaluated in Emergency Department on 03/23/2019 for the symptoms described  in the history of present illness. She was evaluated in the context of the global COVID-19 pandemic, which necessitated consideration that the patient might be at risk for infection with the SARS-CoV-2 virus that causes COVID-19. Institutional protocols and algorithms that pertain to the evaluation of patients at risk for COVID-19 are in a state of rapid change based on information released by regulatory bodies including the CDC and federal and state organizations. These policies and algorithms were followed during the patient's care in the ED.  Some ED evaluations and interventions may be delayed as a result of limited staffing during the pandemic.*  Note:  This document was prepared using Dragon voice recognition software and may include unintentional dictation errors.   Gregor Hams, MD 03/23/19 5176    Gregor Hams, MD 04/04/19 [phone]

## 2019-04-21 ENCOUNTER — Encounter: Payer: Medicare Other | Admitting: Internal Medicine

## 2019-04-21 NOTE — Progress Notes (Signed)
 This encounter was created in error - please disregard.

## 2019-04-24 ENCOUNTER — Ambulatory Visit (INDEPENDENT_AMBULATORY_CARE_PROVIDER_SITE_OTHER): Payer: Medicare Other | Admitting: Internal Medicine

## 2019-04-24 ENCOUNTER — Encounter: Payer: Self-pay | Admitting: Internal Medicine

## 2019-04-24 DIAGNOSIS — J9611 Chronic respiratory failure with hypoxia: Secondary | ICD-10-CM

## 2019-04-24 DIAGNOSIS — J449 Chronic obstructive pulmonary disease, unspecified: Secondary | ICD-10-CM

## 2019-04-24 MED ORDER — BEVESPI AEROSPHERE 9-4.8 MCG/ACT IN AERO: 2.0000 | INHALATION_SPRAY | Freq: Two times a day (BID) | RESPIRATORY_TRACT | 6 refills | Status: DC

## 2019-04-24 MED ORDER — ALBUTEROL SULFATE HFA 108 (90 BASE) MCG/ACT IN AERS: INHALATION_SPRAY | RESPIRATORY_TRACT | 6 refills | Status: DC

## 2019-04-24 NOTE — Progress Notes (Signed)
PULMONARY OFFICE FOLLOW UP NOTE   I connected with the patient by telephone enabled telemedicine visit and verified that I am speaking with the correct person using two identifiers.    I discussed the limitations, risks, security and privacy concerns of performing an evaluation and management service by telemedicine and the availability of in-person appointments. I also discussed with the patient that there may be a patient responsible charge related to this service. The patient expressed understanding and agreed to proceed.  PATIENT AGREES AND CONFIRMS -YES   Other persons participating in the visit and their role in the encounter: Patient, nursing    I discussed the limitations, risks, security and privacy concerns of performing an evaluation and management service by telephone and the availability of in person appointments. I also discussed with the patient that there may be a patient responsible charge related to this service. The patient expressed understanding and agreed to proceed.  This visit type was conducted due to national recommendations for restrictions regarding the COVID-19 Pandemic (e.g. social distancing).  This format is felt to be most appropriate for this patient at this time.  All issues noted in this document were discussed and addressed.        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  PFTs were ordered but she never got these done.  +nocturnal hypoxia on oxygen 2L [place]  CT lung cancer Reviewed results with patient No pneumothorax. No pleural effusion. Moderate centrilobular emphysema with diffuse  bronchial wall thickening. No acute consolidative airspace disease or lung masses. Symmetric mild biapical pleural-parenchymal scarring. Calcified subcentimeter posterior right upper lobe granuloma. No significant pulmonary nodules.  She uses Bevespi and really helps her breathing a lot No evidence of infection at this time No evidence of COPD exacerbation     Review of Systems:  Gen:  Denies  fever, sweats, chills weight loss  HEENT: Denies blurred vision, double vision, ear pain, eye pain, hearing loss, nose bleeds, sore throat Cardiac:  No dizziness, chest pain or heaviness, chest tightness,edema, No JVD Resp:   No cough, -sputum production, +shortness of breath,+wheezing, -hemoptysis,  Gi: Denies swallowing difficulty, stomach pain, nausea or vomiting, diarrhea, constipation, bowel incontinence Gu:  Denies bladder incontinence, burning urine Ext:   Denies Joint pain, stiffness or swelling Skin: Denies  skin rash, easy bruising or bleeding or hives Endoc:  Denies polyuria, polydipsia , polyphagia or weight change Psych:   Denies depression, insomnia or hallucinations  Other:  All other systems negative       Assessment/Plan   chronic SOB and DOE from COPD-seems to be stable, chronic SOB and DOE Continue ANORO Albuterol as needed Avoid triggers(smoke, dust) No need for ABX or steroids at this time Check PFT's Check 6MWT   Chronic Hypoxic resp failure Patient uses and benefits from oxygen therapy Needs this for survival  CT lung cancer program Follow up up CT chest pending Previous CT scan no lung masses or nodules    COVID-19 EDUCATION: The signs and symptoms of COVID-19 were discussed with the patient and how to seek care for testing.  The importance of social distancing was discussed  today. Hand Washing Techniques and avoid touching face was advised.  MEDICATION ADJUSTMENTS/LABS AND TESTS ORDERED: Continue oxygen as prescribed Continue inhalers as  prescribed Follow up CT chest lung cancer screening Obtain PFT's   CURRENT MEDICATIONS REVIEWED AT LENGTH WITH PATIENT TODAY   Patient satisfied with Plan of action and management. All questions answered  Follow up in 6 months  Total Time Spent 32 mins  Maretta Bees Patricia Pesa, M.D.  [facility]  Medical Director [place] Director [facility] Cardio-Pulmonary Department

## 2019-04-24 NOTE — Patient Instructions (Addendum)
Continue iNhalers as prescribed  Continue oxygen as prescribed  Avoid triggers  check PFT's and 6 WMT

## 2019-05-16 ENCOUNTER — Telehealth: Payer: Self-pay | Admitting: *Deleted

## 2019-05-16 NOTE — Telephone Encounter (Signed)
Left message for patient to notify them that it is time to schedule annual low dose lung cancer screening CT scan. Instructed patient to call back to verify information prior to the scan being scheduled.  

## 2019-05-19 ENCOUNTER — Other Ambulatory Visit: Payer: Self-pay

## 2019-05-19 ENCOUNTER — Ambulatory Visit (INDEPENDENT_AMBULATORY_CARE_PROVIDER_SITE_OTHER): Payer: Medicare Other

## 2019-05-19 DIAGNOSIS — J449 Chronic obstructive pulmonary disease, unspecified: Secondary | ICD-10-CM

## 2019-05-19 NOTE — Progress Notes (Signed)
Pt in office for SMW. Pt currently on 2L cont QHS and 2 pulse with POC.  Due to pt being on oxygen, SMW was turned into qualifying walk. Pt titrated to 2 pulse with POC and spo2 maintained at 93%.

## 2019-05-22 ENCOUNTER — Encounter: Payer: Self-pay | Admitting: *Deleted

## 2019-05-22 ENCOUNTER — Telehealth: Payer: Self-pay | Admitting: *Deleted

## 2019-05-22 ENCOUNTER — Ambulatory Visit: Payer: Medicare Other

## 2019-05-22 DIAGNOSIS — Z122 Encounter for screening for malignant neoplasm of respiratory organs: Secondary | ICD-10-CM

## 2019-05-22 DIAGNOSIS — Z87891 Personal history of nicotine dependence: Secondary | ICD-10-CM

## 2019-05-22 NOTE — Telephone Encounter (Signed)
Patient has been notified that annual lung cancer screening low dose CT scan is due currently or will be in near future. Confirmed that patient is within the age range of 55-77, and asymptomatic, (no signs or symptoms of lung cancer). Patient denies illness that would prevent curative treatment for lung cancer if found. Verified smoking history, (former, quit 2009, 43 pack year). The shared decision making visit was done 05/12/18. Patient is agreeable for CT scan being scheduled.

## 2019-05-24 ENCOUNTER — Ambulatory Visit
Admission: RE | Admit: 2019-05-24 | Discharge: 2019-05-24 | Disposition: A | Discharge status: Home / Self Care | Payer: Medicare Other | Source: Ambulatory Visit | Attending: Family Medicine | Admitting: Family Medicine

## 2019-05-24 DIAGNOSIS — Z1231 Encounter for screening mammogram for malignant neoplasm of breast: Secondary | ICD-10-CM | POA: Insufficient documentation

## 2019-06-08 ENCOUNTER — Other Ambulatory Visit: Payer: Self-pay

## 2019-06-08 ENCOUNTER — Ambulatory Visit
Admission: RE | Admit: 2019-06-08 | Discharge: 2019-06-08 | Disposition: A | Discharge status: Home / Self Care | Payer: Medicare Other | Source: Ambulatory Visit | Attending: Nurse Practitioner | Admitting: Nurse Practitioner

## 2019-06-08 DIAGNOSIS — Z122 Encounter for screening for malignant neoplasm of respiratory organs: Secondary | ICD-10-CM | POA: Diagnosis present

## 2019-06-08 DIAGNOSIS — Z87891 Personal history of nicotine dependence: Secondary | ICD-10-CM

## 2019-06-12 ENCOUNTER — Telehealth: Payer: Self-pay | Admitting: *Deleted

## 2019-06-12 NOTE — Telephone Encounter (Signed)
Notified patient of LDCT lung cancer screening program results with recommendation for 12 month follow up imaging. Also notified of incidental findings noted below and is encouraged to discuss further with PCP who will receive a copy of this note and/or the CT report. Patient verbalizes understanding.   IMPRESSION: 1. Lung-RADS 2S, benign appearance or behavior. Continue annual screening with low-dose chest CT without contrast in 12 months. 2. The "S" modifier above refers to potentially clinically significant non lung cancer related findings. Specifically, there is aortic atherosclerosis, in addition to left main and left anterior descending coronary artery disease. Please note that although the presence of coronary artery calcium documents the presence of coronary artery disease, the severity of this disease and any potential stenosis cannot be assessed on this non-gated CT examination. Assessment for potential risk factor modification, dietary therapy or pharmacologic therapy may be warranted, if clinically indicated. 3. Mild diffuse bronchial wall thickening with mild to moderate centrilobular and paraseptal emphysema; imaging findings suggestive of underlying COPD.  Aortic Atherosclerosis (ICD10-I70.0) and Emphysema (ICD10-J43.9).

## 2019-07-20 ENCOUNTER — Telehealth: Payer: Self-pay | Admitting: Internal Medicine

## 2019-07-20 NOTE — Telephone Encounter (Signed)
ATC pt x2 to relay date/time of covid test- received recording that my call can not be completed at this time.  07/24/2019 prior to 11:00 [facility]

## 2019-07-21 NOTE — Telephone Encounter (Signed)
ATC ptx2- received recording that call could not be completed at this time. Will call back.

## 2019-07-24 ENCOUNTER — Other Ambulatory Visit: Payer: Self-pay

## 2019-07-24 ENCOUNTER — Other Ambulatory Visit
Admission: RE | Admit: 2019-07-24 | Discharge: 2019-07-24 | Disposition: A | Discharge status: Home / Self Care | Payer: Medicare Other | Source: Ambulatory Visit | Attending: Internal Medicine | Admitting: Internal Medicine

## 2019-07-24 DIAGNOSIS — Z01812 Encounter for preprocedural laboratory examination: Secondary | ICD-10-CM | POA: Insufficient documentation

## 2019-07-24 DIAGNOSIS — Z20828 Contact with and (suspected) exposure to other viral communicable diseases: Secondary | ICD-10-CM | POA: Diagnosis not present

## 2019-07-24 LAB — SARS CORONAVIRUS 2 (TAT 6-24 HRS): SARS Coronavirus 2: NEGATIVE

## 2019-07-24 NOTE — Telephone Encounter (Addendum)
Per pt's chart, Pt had covid test. Will close encounter.

## 2019-07-24 NOTE — Telephone Encounter (Signed)
ATC pt x3- call can not completed at this time.

## 2019-07-25 ENCOUNTER — Ambulatory Visit: Payer: Medicare Other | Attending: Internal Medicine

## 2019-07-25 DIAGNOSIS — J449 Chronic obstructive pulmonary disease, unspecified: Secondary | ICD-10-CM | POA: Diagnosis not present

## 2019-08-21 ENCOUNTER — Telehealth: Payer: Self-pay | Admitting: Internal Medicine

## 2019-08-21 NOTE — Telephone Encounter (Signed)
Called spoke with patient.  She is not having fevers, she is having green mucus, shortness of breath and wheezing. using ventolin inhaler.  Hasn't been around anyone sick.   Dr. Mortimer Fries please advise

## 2019-08-22 ENCOUNTER — Telehealth: Payer: Self-pay | Admitting: Internal Medicine

## 2019-08-22 MED ORDER — AZITHROMYCIN 250 MG PO TABS: ORAL_TABLET | ORAL | 0 refills | Status: DC

## 2019-08-22 MED ORDER — AZITHROMYCIN 250 MG PO TABS: ORAL_TABLET | ORAL | 0 refills | Status: AC

## 2019-08-22 NOTE — Addendum Note (Signed)
Addended by: Maryanna Shape A on: 08/22/2019 02:14 PM   Modules accepted: Orders

## 2019-08-22 NOTE — Telephone Encounter (Signed)
Pt is aware of below message and voiced her understanding.  Rx for zpak has been sent to preferred pharmacy. Nothing further is needed.

## 2019-08-22 NOTE — Telephone Encounter (Signed)
Lm for pt

## 2019-08-22 NOTE — Telephone Encounter (Signed)
Spoke to [name] with [facility] and provided him with dx code of J44.9 for zpak. Lm to make pt aware that this has been taken care of.

## 2019-08-22 NOTE — Telephone Encounter (Signed)
Please prescribe z pak 

## 2019-08-22 NOTE — Telephone Encounter (Signed)
Pt is aware that we are currently awaiting response from Dr. Mortimer Fries.  Pt would like a Rx for Zpak ,as this has helped previously.   Dr. Mortimer Fries please advise. Thanks

## 2019-08-22 NOTE — Telephone Encounter (Signed)
Pt is aware of below message and voiced her understanding. Nothing further is needed. 

## 2019-12-08 ENCOUNTER — Encounter: Payer: Self-pay | Admitting: Emergency Medicine

## 2019-12-08 ENCOUNTER — Emergency Department
Admission: EM | Admit: 2019-12-08 | Discharge: 2019-12-08 | Disposition: A | Discharge status: Home / Self Care | Payer: Medicare Other | Attending: Emergency Medicine | Admitting: Emergency Medicine

## 2019-12-08 ENCOUNTER — Other Ambulatory Visit: Payer: Self-pay

## 2019-12-08 ENCOUNTER — Emergency Department: Payer: Medicare Other

## 2019-12-08 DIAGNOSIS — Z87891 Personal history of nicotine dependence: Secondary | ICD-10-CM | POA: Insufficient documentation

## 2019-12-08 DIAGNOSIS — M79605 Pain in left leg: Secondary | ICD-10-CM | POA: Insufficient documentation

## 2019-12-08 DIAGNOSIS — J449 Chronic obstructive pulmonary disease, unspecified: Secondary | ICD-10-CM | POA: Diagnosis not present

## 2019-12-08 DIAGNOSIS — Z79899 Other long term (current) drug therapy: Secondary | ICD-10-CM | POA: Insufficient documentation

## 2019-12-08 LAB — URINALYSIS, COMPLETE (UACMP) WITH MICROSCOPIC
Bacteria, UA: NONE SEEN
Bilirubin Urine: NEGATIVE
Glucose, UA: NEGATIVE mg/dL
Hgb urine dipstick: NEGATIVE
Ketones, ur: NEGATIVE mg/dL
Leukocytes,Ua: NEGATIVE
Nitrite: NEGATIVE
Protein, ur: NEGATIVE mg/dL
Specific Gravity, Urine: 1.009 (ref 1.005–1.030)
Squamous Epithelial / HPF: NONE SEEN (ref 0–5)
pH: 5 (ref 5.0–8.0)

## 2019-12-08 LAB — CBC
HCT: 43 % (ref 36.0–46.0)
Hemoglobin: 13.7 g/dL (ref 12.0–15.0)
MCH: 28.8 pg (ref 26.0–34.0)
MCHC: 31.9 g/dL (ref 30.0–36.0)
MCV: 90.5 fL (ref 80.0–100.0)
Platelets: 207 10*3/uL (ref 150–400)
RBC: 4.75 MIL/uL (ref 3.87–5.11)
RDW: 12.9 % (ref 11.5–15.5)
WBC: 6.4 10*3/uL (ref 4.0–10.5)
nRBC: 0 % (ref 0.0–0.2)

## 2019-12-08 LAB — BASIC METABOLIC PANEL
Anion gap: 7 (ref 5–15)
BUN: 9 mg/dL (ref 8–23)
CO2: 28 mmol/L (ref 22–32)
Calcium: 9.2 mg/dL (ref 8.9–10.3)
Chloride: 104 mmol/L (ref 98–111)
Creatinine, Ser: 0.64 mg/dL (ref 0.44–1.00)
GFR calc Af Amer: >60 mL/min (ref >60)
GFR calc non Af Amer: >60 mL/min (ref >60)
Glucose, Bld: 100 mg/dL — ABNORMAL HIGH (ref 70–99)
Potassium: 5 mmol/L (ref 3.5–5.1)
Sodium: 139 mmol/L (ref 135–145)

## 2019-12-08 LAB — CK: Total CK: 124 U/L (ref 38–234)

## 2019-12-08 MED ORDER — BACLOFEN 5 MG PO TABS: 5.0000 mg | ORAL_TABLET | Freq: Two times a day (BID) | ORAL | 0 refills | Status: DC | PRN

## 2019-12-08 MED ORDER — MELOXICAM 7.5 MG PO TABS: 7.5000 mg | ORAL_TABLET | Freq: Every day | ORAL | 0 refills | Status: DC

## 2019-12-08 NOTE — ED Triage Notes (Signed)
Pt reports yesterday she walked with a friend about a half a mile and her left leg got real stiff and hurt from her foot to her hip. Pt reports used a heating pad last night but has not helped. Pt denies falls or injuries.

## 2019-12-08 NOTE — ED Provider Notes (Signed)
[facility] Emergency Department Provider Note  ____________________________________________  Time seen: Approximately 3:54 PM  I have reviewed the triage vital signs and the nursing notes.   HISTORY  Chief Complaint Leg Pain    HPI Kelli Phillips is a 69 y.o. female that presents to the emergency department for evaluation of left leg pain after walking about a half a mile yesterday.  Pain is in her hip and the side of her left leg.  She denies any shooting pains.  Patient states that leg feels stiff and she also feels some cramping.  She tried heat last night.  Her leg still felt stiff today so she called her primary care provider.  She was recommended to return to the emergency department if pain did not improve today.  Patient states that she is active and walks daily.  No trauma or falls.  Patient reports drinking a lot of water. No shortness of breath, chest pain.  Past Medical History:  Diagnosis Date  . 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 COPD (chronic obstructive pulmonary disease) [facility])     Patient Active Problem List   Diagnosis Date Noted  . Personal history of tobacco use, presenting hazards to health 05/12/2018  . Adenomatous colon polyp 08/14/2014  . Midline low back pain with sciatica 08/14/2014  . Pulmonary emphysema ([place]) 08/14/2014  . 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 Rectal bleeding 01/30/2014    Past Surgical History:  Procedure Laterality Date  . CESAREAN SECTION    . COLON SURGERY      Prior to Admission medications   Medication Sig Start Date End Date Taking? Authorizing Provider  albuterol (VENTOLIN HFA) 108 (90 Base) MCG/ACT inhaler TAKE 2 PUFFS BY MOUTH EVERY 6 HOURS AS NEEDED FOR WHEEZE OR SHORTNESS OF BREATH 04/24/19   Flora Lipps, MD  Baclofen 5 MG TABS Take 5 mg by mouth 2 (two) times daily as needed. 12/08/19   Laban Emperor, PA-C  Glycopyrrolate-Formoterol (BEVESPI AEROSPHERE) 9-4.8 MCG/ACT AERO Inhale 2 puffs  into the lungs 2 (two) times daily. 04/24/19   Flora Lipps, MD  montelukast (SINGULAIR) 10 MG tablet TAKE 1 TABLET BY MOUTH EVERY DAY 10/17/18   Tyler Pita, MD    Allergies Penicillins and Sulfa antibiotics  Family History  Problem Relation Age of Onset  . Breast cancer Paternal Aunt        ?    Social History Social History   Tobacco Use  . Smoking status: Former Smoker    Packs/day: 1.00    Years: 43.00    Pack years: 43.00    Types: Cigarettes    Quit date: 2009    Years since quitting: 12.2  . Smokeless tobacco: Never Used  Substance Use Topics  . Alcohol use: No  . Drug use: No     Review of Systems  Constitutional: No fever/chills Respiratory:No SOB. Gastrointestinal: No abdominal pain.  No nausea, no vomiting.  Musculoskeletal: Positive for leg pain. Skin: Negative for rash, abrasions, lacerations, ecchymosis. Neurological: Negative for numbness or tingling   ____________________________________________   PHYSICAL EXAM:  VITAL SIGNS: ED Triage Vitals [12/08/19 1349]  Enc Vitals Group     BP 129/80     Pulse Rate 85     Resp 18     Temp 98.4 F (36.9 C)     Temp Source Oral     SpO2 95 %     Weight 130 lb (59 kg)  Height 5\' 4"  (1.626 m)     Head Circumference      Peak Flow      Pain Score 8     Pain Loc      Pain Edu?      Excl. in [place]?      Constitutional: Alert and oriented. Well appearing and in no acute distress. Eyes: Conjunctivae are normal. PERRL. EOMI. Head: Atraumatic. ENT:      Ears:      Nose: No congestion/rhinnorhea.      Mouth/Throat: Mucous membranes are moist.  Neck: No stridor. Cardiovascular: Normal rate, regular rhythm.  Good peripheral circulation.  Symmetric pedal pulses bilaterally.  Cap refill less than 3 seconds. Respiratory: Normal respiratory effort without tachypnea or retractions. Lungs CTAB. Good air entry to the bases with no decreased or absent breath sounds. Musculoskeletal: Full range of  motion to all extremities. No gross deformities appreciated.  Mild tenderness to palpation to left trochanteric bursa.  Full range of motion of left hip.  Normal gait.  Feet are warm to touch. Neurologic:  Normal speech and language. No gross focal neurologic deficits are appreciated.  Skin:  Skin is warm, dry and intact. No rash noted. Psychiatric: Mood and affect are normal. Speech and behavior are normal. Patient exhibits appropriate insight and judgement.   ____________________________________________   LABS (all labs ordered are listed, but only abnormal results are displayed)  Labs Reviewed  BASIC METABOLIC PANEL - Abnormal; Notable for the following components:      Result Value   Glucose, Bld 100 (*)    All other components within normal limits  URINALYSIS, COMPLETE (UACMP) WITH MICROSCOPIC - Abnormal; Notable for the following components:   Color, Urine YELLOW (*)    APPearance CLEAR (*)    All other components within normal limits  CBC  CK   ____________________________________________  EKG   ____________________________________________  RADIOLOGY Robinette Haines, personally viewed and evaluated these images (plain radiographs) as part of my medical decision making, as well as reviewing the written report by the radiologist.  DG Lumbar Spine 2-3 Views  Result Date: 12/08/2019 CLINICAL DATA:  Hip pain. EXAM: LUMBAR SPINE - 2-3 VIEW COMPARISON:  08/17/2014. FINDINGS: Paraspinal soft tissues are unremarkable. Mild lumbar spine scoliosis concave right. Diffuse osteopenia. No acute bony abnormality identified. No evidence of fracture. Aortoiliac atherosclerotic vascular calcification. IMPRESSION: 1. Mild lumbar spine scoliosis concave right. Diffuse osteopenia. No acute bony abnormality identified. 2.  Aortoiliac atherosclerotic vascular disease. Electronically Signed   By: Marcello Moores  Register   On: 12/08/2019 16:10   US Venous Img Lower Unilateral Left  Result Date:  12/08/2019 CLINICAL DATA:  Lower extremity pain EXAM: LEFT LOWER EXTREMITY VENOUS DUPLEX ULTRASOUND TECHNIQUE: Gray-scale sonography with graded compression, as well as color Doppler and duplex ultrasound were performed to evaluate the left lower extremity deep venous system from the level of the common femoral vein and including the common femoral, femoral, profunda femoral, popliteal and calf veins including the posterior tibial, peroneal and gastrocnemius veins when visible. The superficial great saphenous vein was also interrogated. Spectral Doppler was utilized to evaluate flow at rest and with distal augmentation maneuvers in the common femoral, femoral and popliteal veins. COMPARISON:  None. FINDINGS: Contralateral Common Femoral Vein: Respiratory phasicity is normal and symmetric with the symptomatic side. No evidence of thrombus. Normal compressibility. Common Femoral Vein: No evidence of thrombus. Normal compressibility, respiratory phasicity and response to augmentation. Saphenofemoral Junction: No evidence of thrombus. Normal compressibility and flow on  color Doppler imaging. Profunda Femoral Vein: No evidence of thrombus. Normal compressibility and flow on color Doppler imaging. Femoral Vein: No evidence of thrombus. Normal compressibility, respiratory phasicity and response to augmentation. Popliteal Vein: No evidence of thrombus. Normal compressibility, respiratory phasicity and response to augmentation. Calf Veins: No evidence of thrombus. Normal compressibility and flow on color Doppler imaging. Superficial Great Saphenous Vein: No evidence of thrombus. Normal compressibility. Venous Reflux:  None. Other Findings:  None. IMPRESSION: No evidence of deep venous thrombosis in the left lower extremity. Right common femoral vein also patent. Electronically Signed   By: Lowella Grip III M.D.   On: 12/08/2019 16:50   DG Hip Unilat W or Wo Pelvis 2-3 Views Left  Result Date: 12/08/2019 CLINICAL DATA:   Left hip pain since walking approximately 1/2 of a mile yesterday. EXAM: DG HIP (WITH OR WITHOUT PELVIS) 2-3V LEFT COMPARISON:  None. FINDINGS: There is no evidence of hip fracture or dislocation. There is no evidence of arthropathy or other focal bone abnormality. IMPRESSION: Negative exam. Electronically Signed   By: Inge Rise M.D.   On: 12/08/2019 16:09    ____________________________________________    PROCEDURES  Procedure(s) performed:    Procedures    Medications - No data to display   ____________________________________________   INITIAL IMPRESSION / ASSESSMENT AND PLAN / ED COURSE  Pertinent labs & imaging results that were available during my care of the patient were reviewed by me and considered in my medical decision making (see chart for details).  Review of the [place] CSRS was performed in accordance of the [place] prior to dispensing any controlled drugs.     Patient presented to emergency department for evaluation of left leg pain after walking yesterday.  Vital signs and exam are reassuring.  Patient denies any trauma.  Lumbar and hip x-ray are negative for acute bony abnormalities.  Ultrasound negative for DVT.  Lab work unremarkable.  CK within normal limits.  Patient will be discharged home with prescriptions for mobic and baclofen. Patient is to follow up with primary care as directed. Patient is given ED precautions to return to the ED for any worsening or new symptoms.  Kelli Phillips was evaluated in Emergency Department on 12/08/2019 for the symptoms described in the history of present illness. She was evaluated in the context of the global COVID-19 pandemic, which necessitated consideration that the patient might be at risk for infection with the SARS-CoV-2 virus that causes COVID-19. Institutional protocols and algorithms that pertain to the evaluation of patients at risk for COVID-19 are in a state of rapid change based on information released by  regulatory bodies including the CDC and federal and state organizations. These policies and algorithms were followed during the patient's care in the ED.   ____________________________________________  FINAL CLINICAL IMPRESSION(S) / ED DIAGNOSES  Final diagnoses:  Left leg pain      NEW MEDICATIONS STARTED DURING THIS VISIT:  ED Discharge Orders         Ordered    meloxicam (MOBIC) 7.5 MG tablet  Daily,   Status:  Discontinued     12/08/19 1831    Baclofen 5 MG TABS  2 times daily PRN     12/08/19 1914              This chart was dictated using voice recognition software/Dragon. Despite best efforts to proofread, errors can occur which can change the meaning. Any change was purely unintentional.    Laban Emperor, PA-C 12/08/19  2330    Duffy Bruce, MD 12/10/19 1208

## 2019-12-08 NOTE — ED Notes (Signed)
See triage note  Presents with pain to left hip/leg pain  States she developed pain after walking yesterday  Pain is from hip into leg  Ambulates to room with slight limp

## 2019-12-28 ENCOUNTER — Telehealth: Payer: Self-pay | Admitting: Internal Medicine

## 2019-12-28 NOTE — Telephone Encounter (Signed)
Pt states that she is having some allergy symptoms of sneezing, coughing. Pt is taking OTC medication for allergies and she said it helps some, but would like to see if DK could call in a Zpak for her. Pharmacy is [facility] on [address]. Pt would like a call with an update at [phone]

## 2019-12-28 NOTE — Telephone Encounter (Signed)
Call returned to patient, she states she was working on her garden yesterday and her allergies are now flared up. She states she is coughing up clear mucous and having increased sneezing. Using bevespi daily. She reports minimal wheezing but states he albuterol is effective for this. She went by her pharmacy and she had a prednisone taper left so she picked it up and states she will call us if she does not get better. Nothing further needed at this time.

## 2020-01-11 ENCOUNTER — Telehealth: Payer: Self-pay | Admitting: Internal Medicine

## 2020-01-11 NOTE — Telephone Encounter (Signed)
Spoke with the pt  She wanted to let us know that she had mild diarrhea after zpack taken recently  This was not prescribed by [place] and she is feeling better now  Nothing further needed

## 2020-02-08 ENCOUNTER — Ambulatory Visit: Payer: Medicare Other | Admitting: Adult Health

## 2020-02-08 NOTE — Progress Notes (Deleted)
New patient visit   Patient: Kelli Phillips   DOB: [birth year]   69 y.o. Female  MRN: [id number] Visit Date: 02/08/2020  Today's healthcare provider: Marcille Buffy, FNP   No chief complaint on file.  Subjective    Kelli Phillips is a 69 y.o. female who presents today as a new patient to establish care.  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   Social History   Socioeconomic History   Marital status: Divorced    Spouse name: Not on file   Number of children: Not on file   Years of education: Not on file   Highest education level: Not on file  Occupational History   Not on file  Tobacco Use   Smoking status: Former Smoker    Packs/day: 1.00    Years: 43.00    Pack years: 43.00    Types: Cigarettes    Quit date: 2009    Years since quitting: 12.4   Smokeless tobacco: Never Used  Scientific laboratory technician Use: Never used  Substance and Sexual Activity   Alcohol use: No   Drug use: No   Sexual activity: Yes    Birth control/protection: Post-menopausal  Other Topics Concern   Not on file  Social History Narrative   Not on file   Social Determinants of Health   Financial Resource Strain:    Difficulty of Paying Living Expenses:   Food Insecurity:    Worried About Charity fundraiser in the Last Year:    Arboriculturist in the Last Year:   Transportation Needs:    Film/video editor (Medical):    Lack of Transportation (Non-Medical):   Physical Activity:    Days of Exercise per Week:    Minutes of Exercise per Session:   Stress:    Feeling of Stress :   Social  Connections:    Frequency of Communication with Friends and Family:    Frequency of Social Gatherings with Friends and Family:    Attends Religious Services:    Active Member of Clubs or Organizations:    Attends Music therapist:    Marital Status:    Outpatient Medications Prior to Visit  Medication Sig   albuterol (VENTOLIN HFA) 108 (90 Base) MCG/ACT inhaler TAKE 2 PUFFS BY MOUTH EVERY 6 HOURS AS NEEDED FOR WHEEZE OR SHORTNESS OF BREATH   Baclofen 5 MG TABS Take 5 mg by mouth 2 (two) times daily as needed.   Glycopyrrolate-Formoterol (BEVESPI AEROSPHERE) 9-4.8 MCG/ACT AERO Inhale 2 puffs into the lungs 2 (two) times daily.   montelukast (SINGULAIR) 10 MG tablet TAKE 1 TABLET BY MOUTH EVERY DAY   No facility-administered medications prior to visit.   Allergies  Allergen Reactions   Penicillins Rash   Sulfa Antibiotics Rash     There is no immunization history on file for this patient.  Health Maintenance  Topic Date Due   Hepatitis C Screening  Never done   COVID-19  Vaccine (1) Never done   TETANUS/TDAP  Never done   COLONOSCOPY  Never done   DEXA SCAN  Never done   PNA vac Low Risk Adult (1 of 2 - PCV13) Never done   INFLUENZA VACCINE  03/31/2020   MAMMOGRAM  05/23/2021    Patient Care Team: [place] as PCP - General  Review of Systems  All other systems reviewed and are negative.   {Heme   Chem   Endocrine   Serology   Results Review (optional):[id number]::" "}  Objective    There were no vitals taken for this visit. Physical Exam ***  Depression Screen No flowsheet data found. No results found for any visits on 02/08/20.  Assessment & Plan     ***  No follow-ups on file.     {provider attestation***:1}   Marcille Buffy, [place] [phone] (phone) [phone] (fax)  [place]

## 2020-02-15 ENCOUNTER — Ambulatory Visit: Payer: Medicare Other | Admitting: Family Medicine

## 2020-03-07 ENCOUNTER — Ambulatory Visit: Payer: Medicare Other | Admitting: Adult Health

## 2020-04-13 ENCOUNTER — Other Ambulatory Visit: Payer: Self-pay | Admitting: Internal Medicine

## 2020-04-13 DIAGNOSIS — J449 Chronic obstructive pulmonary disease, unspecified: Secondary | ICD-10-CM

## 2020-04-18 ENCOUNTER — Telehealth: Payer: Self-pay | Admitting: Internal Medicine

## 2020-04-18 NOTE — Telephone Encounter (Signed)
Left message for patient

## 2020-04-19 NOTE — Telephone Encounter (Signed)
I Recommend that PCP needs to make referral for insurance purposes

## 2020-04-19 NOTE — Telephone Encounter (Signed)
Patient is aware of below recommendations and voiced her understanding.  Nothing further is needed at this time.

## 2020-04-19 NOTE — Telephone Encounter (Signed)
Called and spoke to patient.  Patient is requesting a referral to allergist, due to allergies.  Patient reports of history of chronic allergies.  C/o wheezing, nasal drainage clear in color and prod cough clear mucus x2d. Denied fever, chills, sweats or additional symptoms.   Dr. Mortimer Fries, please advise. thanks

## 2020-04-19 NOTE — Telephone Encounter (Signed)
Received call back from patient, who had concerned about our previous conversation. Patient stated that the once our call ended that she overheard me talking about her. I explained to patient that I was discussing her symptoms with Dr. Patsey Berthold, as Patsey Berthold was questioning what the call was about. Patient then stated that "she is not stupid, just because of her age". I then asked patient what she heard being said and her response was "I thought you were being rude". I apologized to patient that she felt that way and explained to her that I was not. I had a very hard time understanding patients needs during our conversation due to poor phone signal.  Patient would like a call back from nurse supervisor.  Routing to Lauren to address. Thanks

## 2020-05-01 ENCOUNTER — Other Ambulatory Visit: Payer: Self-pay | Admitting: Family Medicine

## 2020-05-03 ENCOUNTER — Other Ambulatory Visit: Payer: Self-pay | Admitting: Family Medicine

## 2020-05-03 DIAGNOSIS — Z1231 Encounter for screening mammogram for malignant neoplasm of breast: Secondary | ICD-10-CM

## 2020-05-07 ENCOUNTER — Ambulatory Visit (INDEPENDENT_AMBULATORY_CARE_PROVIDER_SITE_OTHER): Payer: Medicare Other | Admitting: Primary Care

## 2020-05-07 ENCOUNTER — Encounter: Payer: Self-pay | Admitting: Primary Care

## 2020-05-07 ENCOUNTER — Other Ambulatory Visit: Payer: Self-pay

## 2020-05-07 VITALS — BP 130/70 | HR 69 | Temp 97.7°F | Ht 64.0 in | Wt 139.0 lb

## 2020-05-07 DIAGNOSIS — J309 Allergic rhinitis, unspecified: Secondary | ICD-10-CM

## 2020-05-07 DIAGNOSIS — J329 Chronic sinusitis, unspecified: Secondary | ICD-10-CM

## 2020-05-07 DIAGNOSIS — J449 Chronic obstructive pulmonary disease, unspecified: Secondary | ICD-10-CM

## 2020-05-07 MED ORDER — PREDNISONE 10 MG PO TABS: ORAL_TABLET | ORAL | 0 refills | Status: DC

## 2020-05-07 MED ORDER — BEVESPI AEROSPHERE 9-4.8 MCG/ACT IN AERO: 2.0000 | INHALATION_SPRAY | Freq: Two times a day (BID) | RESPIRATORY_TRACT | 6 refills | Status: DC

## 2020-05-07 MED ORDER — MONTELUKAST SODIUM 10 MG PO TABS: 10.0000 mg | ORAL_TABLET | Freq: Every day | ORAL | 2 refills | Status: DC

## 2020-05-07 NOTE — Patient Instructions (Addendum)
Recommendations: Resume Montelukast (Singulair) 10mg  at bedtime  Take Loratadine (Claritin) as needed  Continue Bevespi two puffs twice daily (rinse mouth after use) Use albuterol 2 puffs every 4-6 hours as needed for breakthrough shortness of breath or wheezing   Rx: Prednisone 20mg  x 5 days Refilling Brevespi   Referral: ENT re: chronic sinusitis   Follow-up: 6 months with Dr. Mortimer Fries
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
## 2020-05-11 ENCOUNTER — Other Ambulatory Visit: Payer: Self-pay | Admitting: Internal Medicine

## 2020-05-11 DIAGNOSIS — J449 Chronic obstructive pulmonary disease, unspecified: Secondary | ICD-10-CM

## 2020-05-20 ENCOUNTER — Telehealth: Payer: Self-pay | Admitting: Internal Medicine

## 2020-05-20 ENCOUNTER — Encounter: Payer: Self-pay | Admitting: Primary Care

## 2020-05-20 DIAGNOSIS — J449 Chronic obstructive pulmonary disease, unspecified: Secondary | ICD-10-CM

## 2020-05-20 DIAGNOSIS — J309 Allergic rhinitis, unspecified: Secondary | ICD-10-CM | POA: Insufficient documentation

## 2020-05-20 NOTE — Assessment & Plan Note (Signed)
-   Persistent sinusitis symptoms. Unable to use nasal steroids. Recommend patient resume Montelukast 10mg  at bedtime, continue prn Loratadine.  - RX Prednisone 20mg  x 5 days  - Referral ENT re: chronic sinusitis

## 2020-05-20 NOTE — Assessment & Plan Note (Addendum)
-   Hx severe COPD/ FEV1 29%. Symptoms fairly stable on current therapy. She has had 1-2 exacerbations since August 2020. Feels her breathing has improved on LABA/LAMA.  - Continue Bevespi two puffs twice daily (rinse mouth after use) - Use albuterol 2 puffs every 4-6 hours as needed for breakthrough shortness of breath or wheezing  - Follow-up 6 months with Dr. Mortimer Fries

## 2020-05-20 NOTE — Telephone Encounter (Signed)
Yes please

## 2020-05-20 NOTE — Telephone Encounter (Signed)
Called and spoke to patient, who stated that she is due for oxygen re certification. Patient wears 2L QHS.  It appears that patient needs ONO on roomair.   Dr. Mortimer Fries, please advise if okay to order ONO?

## 2020-05-21 NOTE — Telephone Encounter (Signed)
Patient is aware of below message and voiced her understanding.  ONO on roomair has been ordered.  Nothing further is needed at this time.

## 2020-05-21 NOTE — Telephone Encounter (Signed)
Lm for patient.  

## 2020-05-23 ENCOUNTER — Telehealth: Payer: Self-pay | Admitting: Internal Medicine

## 2020-05-23 MED ORDER — DOXYCYCLINE HYCLATE 100 MG PO TABS: 100.0000 mg | ORAL_TABLET | Freq: Two times a day (BID) | ORAL | 0 refills | Status: DC

## 2020-05-23 MED ORDER — PREDNISONE 10 MG PO TABS: ORAL_TABLET | ORAL | 0 refills | Status: DC

## 2020-05-23 NOTE — Telephone Encounter (Signed)
Dr. Mortimer Fries patient seen for COPD and chronic sinusitis. Last seen [facility] 05/07/2020- no pending appt.   Called and spoke to patient.  Patient reports of productive cough with green mucus, upper back pain, nasal drainage clear in color and wheezing x1w.  Denied fever, chills, sweats or additional symptoms.   Using zyrtec once daily, bevespi BID and ventolin TID with some relief in symptoms. Patient has had one covid vaccine.  Denies sick contacts.   Beth please advise, as Dr. Mortimer Fries is unavailable. Thanks

## 2020-05-23 NOTE — Telephone Encounter (Signed)
Patient is aware of recommendations and voiced her understanding.  Patient aware to avoid direct sunlight and wear sunscreen when in sun while taking doxycycline.  Nothing further needed.

## 2020-05-23 NOTE — Telephone Encounter (Signed)
Sent in RX doxycycline 1 tab twice daily x 7 days and prednisone taper for COPD exacerbation. She should take mucinex 600mg  twice daily. If she hasn't been fully vaccinated for covid needs to be tested.  Follow-up if no improvement or symptoms worsen would get CXR

## 2020-06-05 ENCOUNTER — Other Ambulatory Visit: Payer: Self-pay

## 2020-06-05 ENCOUNTER — Ambulatory Visit
Admission: RE | Admit: 2020-06-05 | Discharge: 2020-06-05 | Disposition: A | Discharge status: Home / Self Care | Payer: Medicare Other | Source: Ambulatory Visit | Attending: Family Medicine | Admitting: Family Medicine

## 2020-06-05 DIAGNOSIS — Z1231 Encounter for screening mammogram for malignant neoplasm of breast: Secondary | ICD-10-CM | POA: Insufficient documentation

## 2020-06-09 ENCOUNTER — Other Ambulatory Visit: Payer: Self-pay

## 2020-06-09 ENCOUNTER — Emergency Department
Admission: EM | Admit: 2020-06-09 | Discharge: 2020-06-09 | Disposition: A | Discharge status: Home / Self Care | Payer: Medicare Other | Attending: Emergency Medicine | Admitting: Emergency Medicine

## 2020-06-09 DIAGNOSIS — Z20822 Contact with and (suspected) exposure to covid-19: Secondary | ICD-10-CM | POA: Diagnosis not present

## 2020-06-09 DIAGNOSIS — J449 Chronic obstructive pulmonary disease, unspecified: Secondary | ICD-10-CM | POA: Insufficient documentation

## 2020-06-09 DIAGNOSIS — R112 Nausea with vomiting, unspecified: Secondary | ICD-10-CM | POA: Diagnosis not present

## 2020-06-09 DIAGNOSIS — Z7951 Long term (current) use of inhaled steroids: Secondary | ICD-10-CM | POA: Diagnosis not present

## 2020-06-09 DIAGNOSIS — Z87891 Personal history of nicotine dependence: Secondary | ICD-10-CM | POA: Diagnosis not present

## 2020-06-09 DIAGNOSIS — R519 Headache, unspecified: Secondary | ICD-10-CM | POA: Diagnosis present

## 2020-06-09 DIAGNOSIS — Z85819 Personal history of malignant neoplasm of unspecified site of lip, oral cavity, and pharynx: Secondary | ICD-10-CM | POA: Insufficient documentation

## 2020-06-09 LAB — RESPIRATORY PANEL BY RT PCR (FLU A&B, COVID)
Influenza A by PCR: NEGATIVE
Influenza B by PCR: NEGATIVE
SARS Coronavirus 2 by RT PCR: NEGATIVE

## 2020-06-09 MED ORDER — ACETAMINOPHEN 500 MG PO TABS
1000.0000 mg | ORAL_TABLET | Freq: Once | ORAL | Status: AC
  Administered 2020-06-09: 1000 mg via ORAL

## 2020-06-09 MED ORDER — ACETAMINOPHEN 325 MG PO TABS
ORAL_TABLET | ORAL | Status: AC
  Filled 2020-06-09: qty 2

## 2020-06-09 NOTE — Discharge Instructions (Signed)
Take Zofran before you take your doxycycline. Your COVID-19 results will posterior MyChart.

## 2020-06-09 NOTE — ED Triage Notes (Signed)
Pt arrived via ems from home, ambulatory to triage. Pt c/o emesis, sinus facial pain, cold chills. Pt denies any diarrhea. pt given doxycycline for sinus infection, but unable to take due to stomach upset. Vitals WNL, NAD noted in triage. RR even and unlabored.

## 2020-06-09 NOTE — ED Provider Notes (Signed)
Emergency Department Provider Note  ____________________________________________  Time seen: Approximately 8:57 PM  I have reviewed the triage vital signs and the nursing notes.   HISTORY  Chief Complaint Emesis and Facial Pain   Historian Patient     HPI Kelli Phillips is a 69 y.o. female presents to the emergency department with facial pain.  Patient states that she was diagnosed with sinusitis by her primary care provider and started on doxycycline.  Patient states that after she takes doxycycline, she experiences emesis and nausea.  She has not been taking Zofran before taking the doxycycline.  She denies diarrhea and fever at home.  She has had sporadic cough.  No chest pain, chest tightness or abdominal pain.  No other alleviating measures have been attempted.   Past Medical History:  Diagnosis Date   Cancer ([place])    skin cancer on lip   COPD (chronic obstructive pulmonary disease) ([place])      Immunizations up to date:  Yes.     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 Authorizing Provider  albuterol (VENTOLIN HFA) 108 (90 Base) MCG/ACT inhaler Inhale 2 puffs into the lungs every 6 (six) hours as needed for wheezing or shortness of breath. 05/11/20   Tyler Pita, MD  Baclofen 5 MG TABS Take 5 mg by mouth 2 (two) times daily as  needed. 12/08/19   Laban Emperor, PA-C  doxycycline (VIBRA-TABS) 100 MG tablet Take 1 tablet (100 mg total) by mouth 2 (two) times daily. 05/23/20   Martyn Ehrich, NP  Glycopyrrolate-Formoterol (BEVESPI AEROSPHERE) 9-4.8 MCG/ACT AERO Inhale 2 puffs into the lungs 2 (two) times daily. 05/07/20   Martyn Ehrich, NP  montelukast (SINGULAIR) 10 MG tablet Take 1 tablet (10 mg total) by mouth daily. 05/07/20   Martyn Ehrich, NP  predniSONE (DELTASONE) 10 MG tablet Take 4 tabs po daily x 2 days; then 3 tabs for 2 days; then 2 tabs for 2 days; then 1 tab for 2 days 05/23/20   Martyn Ehrich, NP    Allergies Penicillins and Sulfa antibiotics  Family History  Problem Relation Age of Onset   Breast cancer Paternal Aunt        ?    Social History Social History   Tobacco Use   Smoking status: Former Smoker    Packs/day: 1.00    Years: 43.00    Pack years: 43.00    Types: Cigarettes    Quit date: 2009    Years since quitting: 12.7   Smokeless tobacco: Never Used  Scientific laboratory technician Use: Never used  Substance Use Topics   Alcohol use: No   Drug use: No     Review of Systems  Constitutional: No fever/chills Eyes:  No discharge ENT: Patient has facial pain.  Respiratory: no cough. No SOB/ use of accessory muscles to breath Gastrointestinal: Patient has nausea.  Musculoskeletal: Negative for musculoskeletal pain. Skin: Negative for rash, abrasions, lacerations, ecchymosis.    ____________________________________________   PHYSICAL EXAM:  VITAL SIGNS: ED Triage Vitals  Enc Vitals Group     BP 06/09/20 1813 133/73     Pulse Rate 06/09/20 1813 86     Resp 06/09/20 1813 18     Temp 06/09/20 1813 98.7 F (37.1 C)     Temp Source 06/09/20 1813 Oral     SpO2 06/09/20 1813 94 %     Weight 06/09/20 1815 140 lb (63.5 kg)     Height 06/09/20 1815 5\' 4"  (1.626 m)     Head Circumference --      Peak Flow --      Pain Score 06/09/20 1815 5     Pain Loc --       Pain Edu? --      Excl. in [place]? --      Constitutional: Alert and oriented. Well appearing and in no acute distress. Eyes: Conjunctivae are normal. PERRL. EOMI. Head: Atraumatic.  Patient has maxillary sinus tenderness. ENT:      Ears:       Nose: No congestion/rhinnorhea.      Mouth/Throat: Mucous membranes are moist.  Neck: No stridor.  No cervical spine tenderness to palpation.  Cardiovascular: Normal rate, regular rhythm. Normal S1 and S2.  Good peripheral circulation. Respiratory: Normal respiratory effort without tachypnea or retractions. Lungs CTAB. Good air entry to the bases with no decreased or absent breath sounds Gastrointestinal: Bowel sounds x 4 quadrants. Soft and nontender to palpation. No guarding or rigidity. No distention. Musculoskeletal: Full range of motion to all extremities. No obvious deformities noted Neurologic:  Normal for age. No gross focal neurologic deficits are appreciated.  Skin:  Skin is warm, dry and intact. No rash noted. Psychiatric: Mood and affect are normal for age. Speech and behavior are normal.   ____________________________________________   LABS (all labs ordered are listed, but only abnormal results are displayed)  Labs Reviewed  RESPIRATORY PANEL BY RT PCR (FLU A&B, COVID)   ____________________________________________  EKG   ____________________________________________  RADIOLOGY   No results found.  ____________________________________________    PROCEDURES  Procedure(s) performed:     Procedures     Medications  acetaminophen (TYLENOL) tablet 1,000 mg (has no administration in time range)     ____________________________________________   INITIAL IMPRESSION / ASSESSMENT AND PLAN / ED COURSE  Pertinent labs & imaging results that were available during my care of the patient were reviewed by me and considered in my medical decision making (see chart for details).      Assessment and plan Facial  pain Nausea 69 year old female presents to the emergency department with persistent facial pain and vomiting that occurs after doxycycline.  Recommended taking Zofran before patient takes her antibiotic and to take antibiotic with food.  Patient elected to be Covid tested while she was in the emergency department.  Patient assured me that she has plenty of Zofran at home.  Return precautions were given to return with new or worsening symptoms.   ____________________________________________  FINAL CLINICAL IMPRESSION(S) / ED DIAGNOSES  Final diagnoses:  Non-intractable vomiting with nausea, unspecified vomiting type      NEW MEDICATIONS STARTED DURING THIS VISIT:  ED Discharge Orders    None          This chart was dictated using voice  recognition software/Dragon. Despite best efforts to proofread, errors can occur which can change the meaning. Any change was purely unintentional.     Karren Cobble 06/09/20 2103    Lavonia Drafts, MD 06/09/20 2148

## 2020-06-13 ENCOUNTER — Encounter: Payer: Self-pay | Admitting: Internal Medicine

## 2020-06-18 ENCOUNTER — Encounter: Payer: Self-pay | Admitting: *Deleted

## 2020-06-18 ENCOUNTER — Other Ambulatory Visit: Payer: Self-pay | Admitting: *Deleted

## 2020-06-18 DIAGNOSIS — Z122 Encounter for screening for malignant neoplasm of respiratory organs: Secondary | ICD-10-CM

## 2020-06-18 DIAGNOSIS — Z87891 Personal history of nicotine dependence: Secondary | ICD-10-CM

## 2020-06-18 NOTE — Progress Notes (Signed)
Former smoker, quit 2009, 43 pack year

## 2020-07-03 ENCOUNTER — Other Ambulatory Visit: Payer: Self-pay

## 2020-07-03 ENCOUNTER — Ambulatory Visit
Admission: RE | Admit: 2020-07-03 | Discharge: 2020-07-03 | Disposition: A | Discharge status: Home / Self Care | Payer: Medicare Other | Source: Ambulatory Visit | Attending: Nurse Practitioner | Admitting: Nurse Practitioner

## 2020-07-03 DIAGNOSIS — Z87891 Personal history of nicotine dependence: Secondary | ICD-10-CM | POA: Insufficient documentation

## 2020-07-03 DIAGNOSIS — Z122 Encounter for screening for malignant neoplasm of respiratory organs: Secondary | ICD-10-CM | POA: Insufficient documentation

## 2020-07-06 ENCOUNTER — Encounter: Payer: Self-pay | Admitting: *Deleted

## 2020-07-11 ENCOUNTER — Telehealth: Payer: Self-pay

## 2020-07-11 DIAGNOSIS — J449 Chronic obstructive pulmonary disease, unspecified: Secondary | ICD-10-CM

## 2020-07-11 NOTE — Telephone Encounter (Signed)
Patient stated that she keeps receiving a call from [employer] regarding ONO.  ONO was ordered on 05/21/2020.  I have spoken to Rodena Piety, who stated that she received a call from patient on 07/10/2020 regarding this matter. Rodena Piety spoke with Melissa with [employer] and requested for ONO results to be faxed to our office. Melissa did not mention where [employer] had attempted to contact patient.   ONO has been received and placed in Dr. Zoila Shutter folder for review.   Dr. Delanna Ahmadi advise. thanks

## 2020-07-15 ENCOUNTER — Telehealth: Payer: Self-pay | Admitting: Internal Medicine

## 2020-07-15 DIAGNOSIS — Z8249 Family history of ischemic heart disease and other diseases of the circulatory system: Secondary | ICD-10-CM

## 2020-07-15 NOTE — Telephone Encounter (Signed)
Dr. Kasa, please advise. Thanks °

## 2020-07-15 NOTE — Telephone Encounter (Signed)
Dr. Mortimer Fries  Mrs. Kelli Phillips just called in asking for Cardiology referral. She had LCS CT done 07/03/20 and Kelli Phillips recommended that she call our office and get referral to Cardiology because of her CT results.  Mrs. Kelli Phillips states that her family does have heart issues

## 2020-07-16 NOTE — Telephone Encounter (Signed)
ONo reviewed by Dr. Mortimer Fries-- recommend 1L QHS  Patient is aware and voiced her understanding.  She is agreeable with night time oxygen, Order has been placed. Nothing further needed.

## 2020-07-22 NOTE — Telephone Encounter (Signed)
Dr. Kasa, please advise. Thanks °

## 2020-07-24 NOTE — Telephone Encounter (Signed)
Please place referral, evaluate for CAD

## 2020-07-24 NOTE — Telephone Encounter (Signed)
Lm for patient.  

## 2020-07-29 NOTE — Telephone Encounter (Signed)
Referral has been placed to cardiology.  Patient is aware and voiced her understanding.  Nothing further needed.

## 2020-07-31 ENCOUNTER — Telehealth: Payer: Self-pay | Admitting: Internal Medicine

## 2020-07-31 NOTE — Telephone Encounter (Signed)
Spoke to patient, who is requesting lung cancer screening results.  I have advised patient ot contact [facility] to obtain these results.  Patient voiced her understanding and had no further questions.  Nothing further needed.

## 2020-09-03 ENCOUNTER — Ambulatory Visit: Payer: Medicare Other | Admitting: Cardiovascular Disease

## 2020-09-17 ENCOUNTER — Telehealth: Payer: Self-pay | Admitting: Internal Medicine

## 2020-09-17 NOTE — Telephone Encounter (Signed)
Spoke to patient and relayed below recommendations.  Patient stated that she does not wish to come in for a visit due to covid surge.  Nothing further needed at this time.

## 2020-09-17 NOTE — Telephone Encounter (Signed)
Kelli Phillips called and left  a message on my phone stating she wanted to know if the doctor would call in a Rx for her.  She states that she doesn't have covid but does have a lot of mucus and it is a little green. She has been using her inhaler.

## 2020-09-17 NOTE — Telephone Encounter (Signed)
Recommend COVID testing.  She should been in the office.  Recommend visit with Dr. Mortimer Fries or one of the NP's.

## 2020-09-17 NOTE — Telephone Encounter (Signed)
Called and spoke to patient.  Patient reports of increased sob with exertion, wheezing, middle back discomfort and productive cough with clear sputum x3d.  Denied fever, chills or sweats.  He has had two covid vaccines. She plans to schedule booster soon.  She stated that she does not feel sick, however she is concerned about sx.  She is using albuterol HFA once and Bevespi 2 puffs BID.  Not taking any OTC medications to help with sx.    Dr. Patsey Berthold, please advise. Dr. Mortimer Fries is unavailable.

## 2020-10-06 ENCOUNTER — Other Ambulatory Visit: Payer: Self-pay | Admitting: Pulmonary Disease

## 2020-10-06 DIAGNOSIS — J449 Chronic obstructive pulmonary disease, unspecified: Secondary | ICD-10-CM

## 2020-10-25 ENCOUNTER — Telehealth: Payer: Self-pay | Admitting: Internal Medicine

## 2020-10-25 NOTE — Telephone Encounter (Signed)
Lm for patient.  

## 2020-10-28 NOTE — Telephone Encounter (Signed)
Lm x2 for patient.  Will close encounter per office protocol.   

## 2020-11-07 ENCOUNTER — Ambulatory Visit (INDEPENDENT_AMBULATORY_CARE_PROVIDER_SITE_OTHER): Payer: Medicare Other | Admitting: Primary Care

## 2020-11-07 ENCOUNTER — Encounter: Payer: Self-pay | Admitting: Primary Care

## 2020-11-07 ENCOUNTER — Other Ambulatory Visit: Payer: Self-pay

## 2020-11-07 DIAGNOSIS — Z87891 Personal history of nicotine dependence: Secondary | ICD-10-CM | POA: Diagnosis not present

## 2020-11-07 DIAGNOSIS — G4734 Idiopathic sleep related nonobstructive alveolar hypoventilation: Secondary | ICD-10-CM

## 2020-11-07 DIAGNOSIS — I700 Atherosclerosis of aorta: Secondary | ICD-10-CM | POA: Diagnosis not present

## 2020-11-07 DIAGNOSIS — J309 Allergic rhinitis, unspecified: Secondary | ICD-10-CM

## 2020-11-07 DIAGNOSIS — J449 Chronic obstructive pulmonary disease, unspecified: Secondary | ICD-10-CM

## 2020-11-07 MED ORDER — ALBUTEROL SULFATE HFA 108 (90 BASE) MCG/ACT IN AERS: 2.0000 | INHALATION_SPRAY | Freq: Four times a day (QID) | RESPIRATORY_TRACT | 6 refills | Status: DC | PRN

## 2020-11-07 MED ORDER — MONTELUKAST SODIUM 10 MG PO TABS: 10.0000 mg | ORAL_TABLET | Freq: Every day | ORAL | 2 refills | Status: DC

## 2020-11-07 MED ORDER — BEVESPI AEROSPHERE 9-4.8 MCG/ACT IN AERO: 2.0000 | INHALATION_SPRAY | Freq: Two times a day (BID) | RESPIRATORY_TRACT | 6 refills | Status: DC

## 2020-11-07 MED ORDER — PREDNISONE 10 MG PO TABS: ORAL_TABLET | ORAL | 0 refills | Status: DC
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
## 2020-11-07 NOTE — Patient Instructions (Addendum)
Nice seeing you today Ms Axley  Recommendations: - Continue Bevespi two puffs morning and evening (fill out patient assistance and mail back to [place]) - Use albuterol 2 puffs every 6 hours as needed for breakthrough shortness of breath - Resume Singulair 10mg  at bedtime - Continue Loratadine daily  - Continue to walk 1.5 miles a week as you have been   Follow-up - 6 months with Dr. Mortimer Fries or APP

## 2020-11-08 ENCOUNTER — Encounter: Payer: Self-pay | Admitting: Primary Care

## 2020-11-08 DIAGNOSIS — I700 Atherosclerosis of aorta: Secondary | ICD-10-CM | POA: Insufficient documentation

## 2020-11-08 DIAGNOSIS — G4734 Idiopathic sleep related nonobstructive alveolar hypoventilation: Secondary | ICD-10-CM | POA: Insufficient documentation

## 2020-11-08 NOTE — Assessment & Plan Note (Addendum)
-   07/26/19- FEV1 0.68 (29%), ratio 41. Breathing has been well controlled on LABA/LAMA. Intermittent wheezing. Last exacerbation was in September 2021. - Continue Bevespi two puffs morning and evening (fill out patient assistance and mail back to [place]) - Use albuterol 2 puffs every 6 hours as needed for breakthrough shortness of breath - Resume Singulair 10mg  at bedtime - FU in 6 months with Dr. Mortimer Fries

## 2020-11-08 NOTE — Assessment & Plan Note (Signed)
-   Following with lung cancer screening program - LDCT 07/03/20 showed lung RADS2, aortic atherosclerosis and emphysema

## 2020-11-08 NOTE — Assessment & Plan Note (Signed)
-  ONO completed. Results were reviewed by Dr. Mortimer Fries, recommending 1L QHS

## 2020-11-08 NOTE — Assessment & Plan Note (Signed)
-   Referred to cardiology, needs to reschedule apt

## 2020-11-08 NOTE — Assessment & Plan Note (Signed)
-   No current issues - Continue Loratadine

## 2020-11-18 ENCOUNTER — Telehealth: Payer: Self-pay | Admitting: Primary Care

## 2020-11-18 DIAGNOSIS — J449 Chronic obstructive pulmonary disease, unspecified: Secondary | ICD-10-CM

## 2020-11-18 MED ORDER — BEVESPI AEROSPHERE 9-4.8 MCG/ACT IN AERO: 2.0000 | INHALATION_SPRAY | Freq: Two times a day (BID) | RESPIRATORY_TRACT | 6 refills | Status: DC

## 2020-11-18 NOTE — Telephone Encounter (Signed)
Rx for Kelli Phillips has been sent to preferred pharmacy.  Patient is aware and voiced her understanding.  Nothing further needed.

## 2020-12-23 ENCOUNTER — Telehealth: Payer: Self-pay | Admitting: Internal Medicine

## 2020-12-23 DIAGNOSIS — J449 Chronic obstructive pulmonary disease, unspecified: Secondary | ICD-10-CM

## 2020-12-23 MED ORDER — PREDNISONE 20 MG PO TABS: 20.0000 mg | ORAL_TABLET | Freq: Every day | ORAL | 0 refills | Status: DC

## 2020-12-23 MED ORDER — BEVESPI AEROSPHERE 9-4.8 MCG/ACT IN AERO: 2.0000 | INHALATION_SPRAY | Freq: Two times a day (BID) | RESPIRATORY_TRACT | 11 refills | Status: DC

## 2020-12-23 MED ORDER — AZITHROMYCIN 250 MG PO TABS: 250.0000 mg | ORAL_TABLET | ORAL | 0 refills | Status: DC

## 2020-12-23 NOTE — Telephone Encounter (Signed)
  Pred 20 mg daily for 7 days  Z pak please   Message text    Pt aware of response per Dr Mortimer Fries  She verbalized understanding  [facility]   [facility] to see why she has still not received a call about her Bevespi refill  I was advised that the medication is on back order at [facility] and she may be able to get it at a [facility] or a [facility] bc they use a different manufacturer. Pt wanted me to try and send to [facility]. Rx sent. She will call if still not able to get her medication.

## 2020-12-23 NOTE — Telephone Encounter (Signed)
Called and spoke with the pt  She is c/o cough with moderate amount of green sputum  She is also c/o having some HA/facial pain Onset of these symptoms 3 days ago She has hx of sinus infections and feels this is what is going on  Had chills last night x temp was 100  She has had covid vax x 2   Took at home covid test this morning and it was neg  She is asking for abx- has done well with zpack in the past  Please advise thanks!  Allergies  Allergen Reactions  . Penicillins Rash  . Sulfa Antibiotics Rash

## 2021-03-12 ENCOUNTER — Telehealth: Payer: Self-pay | Admitting: Primary Care

## 2021-03-12 MED ORDER — AZITHROMYCIN 250 MG PO TABS: ORAL_TABLET | ORAL | 0 refills | Status: AC

## 2021-03-12 MED ORDER — PREDNISONE 20 MG PO TABS: 20.0000 mg | ORAL_TABLET | Freq: Every day | ORAL | 0 refills | Status: DC

## 2021-03-12 NOTE — Telephone Encounter (Signed)
Lm for patient.  

## 2021-03-12 NOTE — Telephone Encounter (Signed)
Make sure she was taking it at bedtime. But yes she can stop

## 2021-03-12 NOTE — Telephone Encounter (Signed)
Spoke to patient, who reports of wheezing, prod cough with yellow sputum, sinus drainage and increased sob x4d Denied fever, chills or sweats. Negative home covid test this morning.  She is using Bevespi BID, Ventolin BID and OTC sinus meds.  Using 2L QHS. Monitoring o2 and spo2 is maintaining in mid 90s. She is requesting prednisone and Zpak.   Dr. Mortimer Fries, please advise. thanks

## 2021-03-12 NOTE — Telephone Encounter (Signed)
Called and spoke with patient. She states that she was taking Montelukast at night, and it still makes her dizzy. Advised her that it is ok to stop medication. Patient verbalized understanding. Nothing further needed at this time.

## 2021-03-12 NOTE — Telephone Encounter (Signed)
ATC left voicemail for pt to return call to office tomorrow

## 2021-03-12 NOTE — Telephone Encounter (Signed)
Called and spoke with pt who states she has been having dizzy spells while being on the montelukast and also has had some staggering going on while on it too.  States she has taken it twice so far and has had the symptoms each time. Pt even said that she has taken this med one time before and also had the same symptoms then too.  Due to this, pt wants to know if she can stop taking the med.  Beth, please advise.

## 2021-03-12 NOTE — Telephone Encounter (Signed)
Patient is aware of recommendations and voiced her understanding.  Rx for prednisone and Zpak has been sent to preferred pharmacy.  Nothing further needed at this time.

## 2021-04-22 ENCOUNTER — Other Ambulatory Visit: Payer: Self-pay | Admitting: Physician Assistant

## 2021-04-22 DIAGNOSIS — Z1231 Encounter for screening mammogram for malignant neoplasm of breast: Secondary | ICD-10-CM

## 2021-05-14 ENCOUNTER — Telehealth: Payer: Self-pay | Admitting: Internal Medicine

## 2021-05-14 MED ORDER — PREDNISONE 20 MG PO TABS: 20.0000 mg | ORAL_TABLET | Freq: Every day | ORAL | 0 refills | Status: DC

## 2021-05-14 MED ORDER — AZITHROMYCIN 250 MG PO TABS: ORAL_TABLET | ORAL | 0 refills | Status: AC

## 2021-05-14 NOTE — Telephone Encounter (Signed)
Spoke to patient, who is requesting zpak and prednisone.  C/o prod cough green sputum, sneezing, nasal drainage and wheezing x2d Sob is baseline.  Denied f/c/s or additional sx.  Negative home covid test 4 days ago- recommend that she retest.  She is using albuterol HFA BID and Bevespi BID.  Dr. Mortimer Fries, please advice. Thanks

## 2021-05-14 NOTE — Telephone Encounter (Signed)
Patient is aware of recommendations and voiced her understanding.  Prednisone and Zpak has been sent to preferred pharmacy. Nothing further needed.

## 2021-06-06 ENCOUNTER — Ambulatory Visit
Admission: RE | Admit: 2021-06-06 | Discharge: 2021-06-06 | Disposition: A | Discharge status: Home / Self Care | Payer: Medicare Other | Source: Ambulatory Visit | Attending: Physician Assistant | Admitting: Physician Assistant

## 2021-06-06 ENCOUNTER — Other Ambulatory Visit: Payer: Self-pay

## 2021-06-06 DIAGNOSIS — Z1231 Encounter for screening mammogram for malignant neoplasm of breast: Secondary | ICD-10-CM | POA: Diagnosis present

## 2021-06-23 ENCOUNTER — Ambulatory Visit: Payer: Medicare Other | Admitting: Internal Medicine

## 2021-07-15 ENCOUNTER — Other Ambulatory Visit: Payer: Self-pay | Admitting: Internal Medicine

## 2021-07-15 ENCOUNTER — Ambulatory Visit (INDEPENDENT_AMBULATORY_CARE_PROVIDER_SITE_OTHER): Payer: Medicare Other | Admitting: Internal Medicine

## 2021-07-15 ENCOUNTER — Other Ambulatory Visit: Payer: Self-pay

## 2021-07-15 ENCOUNTER — Encounter: Payer: Self-pay | Admitting: Internal Medicine

## 2021-07-15 VITALS — BP 122/82 | HR 82 | Temp 97.8°F | Ht 64.0 in | Wt 136.6 lb

## 2021-07-15 DIAGNOSIS — Z87891 Personal history of nicotine dependence: Secondary | ICD-10-CM

## 2021-07-15 DIAGNOSIS — J9611 Chronic respiratory failure with hypoxia: Secondary | ICD-10-CM | POA: Diagnosis not present

## 2021-07-15 DIAGNOSIS — J441 Chronic obstructive pulmonary disease with (acute) exacerbation: Secondary | ICD-10-CM | POA: Diagnosis not present

## 2021-07-15 MED ORDER — PREDNISONE 20 MG PO TABS: 20.0000 mg | ORAL_TABLET | Freq: Every day | ORAL | 1 refills | Status: DC

## 2021-07-15 MED ORDER — AZITHROMYCIN 250 MG PO TABS: ORAL_TABLET | ORAL | 1 refills | Status: DC

## 2021-07-15 NOTE — Progress Notes (Signed)
Cancer

## 2021-07-15 NOTE — Patient Instructions (Addendum)
Continue Inhalers as prescribed Continue Oxygen as prescribed Start Z pak and start PRED 20 mg daily for 7 days  RE-ESTABLISH with lung cancer screening program
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
## 2021-08-04 ENCOUNTER — Telehealth: Payer: Self-pay | Admitting: Internal Medicine

## 2021-08-04 NOTE — Telephone Encounter (Signed)
Spoke to patient and relayed below message. Patient voiced understanding.  Patient stated that she would like to hold off on Molnuporavir at this time since sx are mild. She will call back if sx worsen. Nothing further needed at this time.

## 2021-08-04 NOTE — Telephone Encounter (Signed)
Called and spoke to patient.  Patient stated that she tested positive for covid today. Sx started yesterday. C/o chills, body aches, productive cough with green sputum and mild weakness.  Denied f/s, sob or additional sx.  1 covid vaccine. No flu shot.  2L as needed and QHS. She does not monitor spo2.  She is using albuterol HFA BID and Bevespi BID.  Recent zpak and prednisone 07/15/2021.  Dr. Patsey Berthold, please advise. Dr. Mortimer Fries is unavailable.

## 2021-08-04 NOTE — Telephone Encounter (Signed)
Recommend molnupiravir 800 mg twice a day for 5 days.

## 2021-08-06 ENCOUNTER — Telehealth: Payer: Self-pay | Admitting: Internal Medicine

## 2021-08-06 NOTE — Telephone Encounter (Signed)
Please see 08/04/2021 phone note.  Lm for patient.

## 2021-08-06 NOTE — Telephone Encounter (Signed)
Lm x2 for patient.   

## 2021-08-07 NOTE — Telephone Encounter (Addendum)
Spoke to patient, who stated that she is feeling much better since be dx with covid.  Sx are still mild. She does not wish to proceed with antiviral meds. She stated that she was just calling to update [place] on sx.  Nothing further needed.

## 2021-08-11 ENCOUNTER — Telehealth: Payer: Self-pay | Admitting: Internal Medicine

## 2021-08-11 MED ORDER — AZITHROMYCIN 250 MG PO TABS: ORAL_TABLET | ORAL | 0 refills | Status: AC

## 2021-08-11 MED ORDER — PREDNISONE 20 MG PO TABS: ORAL_TABLET | ORAL | 0 refills | Status: DC

## 2021-08-11 NOTE — Telephone Encounter (Signed)
Spoke to patient.  Patient stated that she tested positive for covid one week ago.  She reports of lingering prod cough with green to brown sputum.  C/o back pain across middle of back, however this is her baseline.  Sob is baseline.  Denied f/c/s or additional sx. She is concerned about PNA.  Dr. Mortimer Fries, please advise. thanks

## 2021-08-11 NOTE — Telephone Encounter (Addendum)
Z pak and Prednisone sent into preferred pharmacy. Nothing further needed.

## 2021-09-02 ENCOUNTER — Other Ambulatory Visit: Payer: Self-pay | Admitting: *Deleted

## 2021-09-02 DIAGNOSIS — Z87891 Personal history of nicotine dependence: Secondary | ICD-10-CM

## 2021-09-12 ENCOUNTER — Ambulatory Visit
Admission: RE | Admit: 2021-09-12 | Discharge: 2021-09-12 | Disposition: A | Discharge status: Home / Self Care | Payer: Medicare Other | Source: Ambulatory Visit | Attending: Acute Care | Admitting: Acute Care

## 2021-09-12 ENCOUNTER — Other Ambulatory Visit: Payer: Self-pay

## 2021-09-12 DIAGNOSIS — Z87891 Personal history of nicotine dependence: Secondary | ICD-10-CM | POA: Insufficient documentation

## 2021-09-15 ENCOUNTER — Other Ambulatory Visit: Payer: Self-pay | Admitting: Acute Care

## 2021-09-15 ENCOUNTER — Other Ambulatory Visit: Payer: Self-pay | Admitting: Adult Health

## 2021-09-15 DIAGNOSIS — I714 Abdominal aortic aneurysm, without rupture, unspecified: Secondary | ICD-10-CM

## 2021-09-15 DIAGNOSIS — Z87891 Personal history of nicotine dependence: Secondary | ICD-10-CM

## 2021-09-15 NOTE — Progress Notes (Signed)
Orders Placed This Encounter  Procedures   Ambulatory referral to Cardiology    Referral Priority:   Routine    Referral Type:   Consultation    Referral Reason:   Specialty Services Required    Requested Specialty:   Cardiology    Number of Visits Requested:   1  Former cigarette smoker - Plan: CT CHEST LUNG CA SCREEN LOW DOSE W/O CM  Personal history of tobacco use, presenting hazards to health - Plan: CT CHEST LUNG CA SCREEN LOW DOSE W/O CM Aortic aneurysm, referred to cardiology should hear within 2 weeks. They will follow. Please let her know.

## 2021-09-15 NOTE — Progress Notes (Signed)
Orders Placed This Encounter  Procedures   Ambulatory referral to Cardiology    Referral Priority:   Routine    Referral Type:   Consultation    Referral Reason:   Specialty Services Required    Requested Specialty:   Cardiology    Number of Visits Requested:   1    

## 2021-09-18 ENCOUNTER — Telehealth: Payer: Self-pay | Admitting: Internal Medicine

## 2021-09-18 NOTE — Telephone Encounter (Signed)
Patient calling PCP, nothing further needed.

## 2021-09-18 NOTE — Telephone Encounter (Signed)
Cardiology referral was placed by PCP.   Lm for patient.

## 2021-10-01 ENCOUNTER — Emergency Department: Payer: Medicare Other

## 2021-10-01 ENCOUNTER — Inpatient Hospital Stay
Admission: EM | Admit: 2021-10-01 | Discharge: 2021-10-03 | DRG: 190 | Disposition: A | Discharge status: Home / Self Care | Payer: Medicare Other | Attending: Hospitalist | Admitting: Hospitalist

## 2021-10-01 ENCOUNTER — Encounter: Payer: Self-pay | Admitting: Emergency Medicine

## 2021-10-01 ENCOUNTER — Other Ambulatory Visit: Payer: Self-pay

## 2021-10-01 DIAGNOSIS — Z880 Allergy status to penicillin: Secondary | ICD-10-CM

## 2021-10-01 DIAGNOSIS — Z87891 Personal history of nicotine dependence: Secondary | ICD-10-CM

## 2021-10-01 DIAGNOSIS — K2900 Acute gastritis without bleeding: Secondary | ICD-10-CM | POA: Diagnosis present

## 2021-10-01 DIAGNOSIS — J441 Chronic obstructive pulmonary disease with (acute) exacerbation: Secondary | ICD-10-CM | POA: Diagnosis not present

## 2021-10-01 DIAGNOSIS — Z20822 Contact with and (suspected) exposure to covid-19: Secondary | ICD-10-CM | POA: Diagnosis present

## 2021-10-01 DIAGNOSIS — J9601 Acute respiratory failure with hypoxia: Secondary | ICD-10-CM | POA: Diagnosis present

## 2021-10-01 DIAGNOSIS — G43909 Migraine, unspecified, not intractable, without status migrainosus: Secondary | ICD-10-CM | POA: Diagnosis present

## 2021-10-01 DIAGNOSIS — R112 Nausea with vomiting, unspecified: Secondary | ICD-10-CM

## 2021-10-01 DIAGNOSIS — Z85828 Personal history of other malignant neoplasm of skin: Secondary | ICD-10-CM

## 2021-10-01 DIAGNOSIS — Z79899 Other long term (current) drug therapy: Secondary | ICD-10-CM

## 2021-10-01 DIAGNOSIS — Z9981 Dependence on supplemental oxygen: Secondary | ICD-10-CM

## 2021-10-01 DIAGNOSIS — Z882 Allergy status to sulfonamides status: Secondary | ICD-10-CM

## 2021-10-01 DIAGNOSIS — J9621 Acute and chronic respiratory failure with hypoxia: Secondary | ICD-10-CM | POA: Diagnosis present

## 2021-10-01 DIAGNOSIS — J019 Acute sinusitis, unspecified: Secondary | ICD-10-CM | POA: Diagnosis present

## 2021-10-01 DIAGNOSIS — Z885 Allergy status to narcotic agent status: Secondary | ICD-10-CM

## 2021-10-01 LAB — URINALYSIS, COMPLETE (UACMP) WITH MICROSCOPIC
Bacteria, UA: NONE SEEN
Bilirubin Urine: NEGATIVE
Glucose, UA: NEGATIVE mg/dL
Hgb urine dipstick: NEGATIVE
Ketones, ur: 15 mg/dL — AB
Leukocytes,Ua: NEGATIVE
Nitrite: NEGATIVE
Protein, ur: NEGATIVE mg/dL
Specific Gravity, Urine: <1.005 — ABNORMAL LOW (ref 1.005–1.030)
pH: 6 (ref 5.0–8.0)

## 2021-10-01 LAB — CBC
HCT: 44.1 % (ref 36.0–46.0)
Hemoglobin: 14 g/dL (ref 12.0–15.0)
MCH: 28.7 pg (ref 26.0–34.0)
MCHC: 31.7 g/dL (ref 30.0–36.0)
MCV: 90.4 fL (ref 80.0–100.0)
Platelets: 168 10*3/uL (ref 150–400)
RBC: 4.88 MIL/uL (ref 3.87–5.11)
RDW: 13 % (ref 11.5–15.5)
WBC: 7.6 10*3/uL (ref 4.0–10.5)
nRBC: 0 % (ref 0.0–0.2)

## 2021-10-01 LAB — COMPREHENSIVE METABOLIC PANEL
ALT: 14 U/L (ref 0–44)
AST: 22 U/L (ref 15–41)
Albumin: 3.7 g/dL (ref 3.5–5.0)
Alkaline Phosphatase: 79 U/L (ref 38–126)
Anion gap: 6 (ref 5–15)
BUN: 13 mg/dL (ref 8–23)
CO2: 28 mmol/L (ref 22–32)
Calcium: 8.8 mg/dL — ABNORMAL LOW (ref 8.9–10.3)
Chloride: 104 mmol/L (ref 98–111)
Creatinine, Ser: 0.57 mg/dL (ref 0.44–1.00)
GFR, Estimated: >60 mL/min (ref >60)
Glucose, Bld: 109 mg/dL — ABNORMAL HIGH (ref 70–99)
Potassium: 3.5 mmol/L (ref 3.5–5.1)
Sodium: 138 mmol/L (ref 135–145)
Total Bilirubin: 1.1 mg/dL (ref 0.3–1.2)
Total Protein: 6.3 g/dL — ABNORMAL LOW (ref 6.5–8.1)

## 2021-10-01 LAB — LACTIC ACID, PLASMA: Lactic Acid, Venous: 1.2 mmol/L (ref 0.5–1.9)

## 2021-10-01 LAB — LIPASE, BLOOD: Lipase: 30 U/L (ref 11–51)

## 2021-10-01 LAB — RESP PANEL BY RT-PCR (FLU A&B, COVID) ARPGX2
Influenza A by PCR: NEGATIVE
Influenza B by PCR: NEGATIVE
SARS Coronavirus 2 by RT PCR: NEGATIVE

## 2021-10-01 MED ORDER — ACETAMINOPHEN 650 MG RE SUPP: 650.0000 mg | Freq: Four times a day (QID) | RECTAL | Status: DC | PRN

## 2021-10-01 MED ORDER — IPRATROPIUM-ALBUTEROL 0.5-2.5 (3) MG/3ML IN SOLN
3.0000 mL | Freq: Once | RESPIRATORY_TRACT | Status: AC
  Administered 2021-10-01: 3 mL via RESPIRATORY_TRACT
  Filled 2021-10-01: qty 3

## 2021-10-01 MED ORDER — PANTOPRAZOLE SODIUM 40 MG IV SOLR
40.0000 mg | INTRAVENOUS | Status: DC
  Administered 2021-10-01: 40 mg via INTRAVENOUS
  Filled 2021-10-01: qty 40

## 2021-10-01 MED ORDER — FLUTICASONE PROPIONATE 50 MCG/ACT NA SUSP
1.0000 | Freq: Two times a day (BID) | NASAL | Status: DC
  Administered 2021-10-02: 21:00:00 1 via NASAL
  Filled 2021-10-01: qty 16

## 2021-10-01 MED ORDER — PREDNISONE 20 MG PO TABS
40.0000 mg | ORAL_TABLET | Freq: Once | ORAL | Status: AC
  Administered 2021-10-01: 40 mg via ORAL
  Filled 2021-10-01: qty 2

## 2021-10-01 MED ORDER — PREDNISONE 20 MG PO TABS
40.0000 mg | ORAL_TABLET | Freq: Every day | ORAL | Status: DC
  Administered 2021-10-03: 40 mg via ORAL
  Filled 2021-10-01: qty 2

## 2021-10-01 MED ORDER — ARFORMOTEROL TARTRATE 15 MCG/2ML IN NEBU
15.0000 ug | INHALATION_SOLUTION | Freq: Two times a day (BID) | RESPIRATORY_TRACT | Status: DC
  Administered 2021-10-02: 15 ug via RESPIRATORY_TRACT
  Filled 2021-10-01 (×5): qty 2

## 2021-10-01 MED ORDER — KETOROLAC TROMETHAMINE 15 MG/ML IJ SOLN
15.0000 mg | Freq: Once | INTRAMUSCULAR | Status: AC
  Administered 2021-10-01: 15 mg via INTRAVENOUS
  Filled 2021-10-01: qty 1

## 2021-10-01 MED ORDER — ONDANSETRON HCL 4 MG PO TABS: 4.0000 mg | ORAL_TABLET | Freq: Four times a day (QID) | ORAL | Status: DC | PRN

## 2021-10-01 MED ORDER — ENOXAPARIN SODIUM 40 MG/0.4ML IJ SOSY
40.0000 mg | PREFILLED_SYRINGE | INTRAMUSCULAR | Status: DC
  Administered 2021-10-01: 21:00:00 40 mg via SUBCUTANEOUS
  Filled 2021-10-01 (×2): qty 0.4

## 2021-10-01 MED ORDER — ONDANSETRON HCL 4 MG/2ML IJ SOLN: 4.0000 mg | Freq: Four times a day (QID) | INTRAMUSCULAR | Status: DC | PRN

## 2021-10-01 MED ORDER — IPRATROPIUM-ALBUTEROL 0.5-2.5 (3) MG/3ML IN SOLN
3.0000 mL | Freq: Four times a day (QID) | RESPIRATORY_TRACT | Status: DC | PRN
  Filled 2021-10-01: qty 3

## 2021-10-01 MED ORDER — SODIUM CHLORIDE 0.9 % IV BOLUS
1000.0000 mL | Freq: Once | INTRAVENOUS | Status: AC
  Administered 2021-10-01: 1000 mL via INTRAVENOUS

## 2021-10-01 MED ORDER — ACETAMINOPHEN 325 MG PO TABS
650.0000 mg | ORAL_TABLET | Freq: Four times a day (QID) | ORAL | Status: DC | PRN
  Administered 2021-10-02: 01:00:00 650 mg via ORAL
  Filled 2021-10-01: qty 2

## 2021-10-01 MED ORDER — UMECLIDINIUM BROMIDE 62.5 MCG/ACT IN AEPB
1.0000 | INHALATION_SPRAY | Freq: Every day | RESPIRATORY_TRACT | Status: DC
  Administered 2021-10-02 – 2021-10-03 (×2): 1 via RESPIRATORY_TRACT
  Filled 2021-10-01: qty 7

## 2021-10-01 MED ORDER — SODIUM CHLORIDE 0.9 % IV BOLUS
500.0000 mL | Freq: Once | INTRAVENOUS | Status: AC
  Administered 2021-10-01: 500 mL via INTRAVENOUS

## 2021-10-01 MED ORDER — METHYLPREDNISOLONE SODIUM SUCC 40 MG IJ SOLR
40.0000 mg | Freq: Two times a day (BID) | INTRAMUSCULAR | Status: AC
  Administered 2021-10-01 – 2021-10-02 (×2): 40 mg via INTRAVENOUS
  Filled 2021-10-01 (×2): qty 1

## 2021-10-01 MED ORDER — ONDANSETRON HCL 4 MG/2ML IJ SOLN
4.0000 mg | INTRAMUSCULAR | Status: AC
  Administered 2021-10-01: 4 mg via INTRAVENOUS
  Filled 2021-10-01: qty 2

## 2021-10-01 MED ORDER — IOHEXOL 300 MG/ML  SOLN
100.0000 mL | Freq: Once | INTRAMUSCULAR | Status: AC | PRN
  Administered 2021-10-01: 100 mL via INTRAVENOUS

## 2021-10-01 MED ORDER — SODIUM CHLORIDE 0.9 % IV SOLN
12.5000 mg | Freq: Once | INTRAVENOUS | Status: AC
  Administered 2021-10-01: 12.5 mg via INTRAVENOUS
  Filled 2021-10-01: qty 0.5

## 2021-10-01 MED ORDER — SODIUM CHLORIDE 0.9 % IV SOLN: INTRAVENOUS | Status: DC

## 2021-10-01 NOTE — ED Triage Notes (Signed)
Patient to ED via [facility] for N/V since last night. Pt was initially 85% on RA with EMS. Pt states she does wear O2 at night for COPD. Patient alert and oriented at this time.

## 2021-10-01 NOTE — ED Notes (Signed)
Patient O2 dropped in mid 80's on RA. Patient placed back onto 2L [place] with O2 at 92%.

## 2021-10-01 NOTE — H&P (Signed)
History and Physical    Kelli Phillips GQQ:[id number] DOB: 1951/01/06 DOA: 10/01/2021  PCP: Kelli Beam, FNP   Patient coming from: Home  I have personally briefly reviewed patient's old medical records in [place]  Chief Complaint: Nausea/vomiting  HPI: Kelli Phillips is a 71 y.o. female with medical history significant for COPD with chronic respiratory failure on 1 L of oxygen continuous, history of migraine headaches who presents to the emergency room via EMS for evaluation of refractory nausea, vomiting and a frontal headache. Per EMS patient had room air pulse oximetry of 85%. Patient states that she was in her usual state of health until 1 day prior to admission when she developed a frontal headache which she describes as a sinus headache associated with increased sinus drainage, cough and nausea and vomiting.  She also complains of worsening shortness of breath from her baseline. She denies having any abdominal pain or any changes in her bowel habits and denies having any fever or chills.  She has not been able to tolerate any oral intake due to the persistent nausea and vomiting and continues to have a headache which she rates a 5 x 10 in intensity at its worst. The headache she states is different from her usual migraines and she denies having any photophobia. She denies having any blurred vision or double vision.  She denies having any chest pain, no dizziness, no diaphoresis, no palpitations, no leg swelling, no focal deficits no claudication. Sodium 138, potassium 3.5, chloride 104, bicarb 28, glucose 109, BUN 13, creatinine 0.57, calcium 8.8, alkaline phosphatase 79, albumin 3.7, lipase 30, AST 22, ALT 40, total protein 6.3, lactic acid 1.2, white count 7.6, hemoglobin 14, hematocrit 44.1, MCV 90.4, RDW 13.0, platelet count 168 Respiratory viral panel is negative Urine analysis is sterile CT scan of the abdomen and pelvis shows no acute abnormality.  Minimal  layering gallbladder sludge or tiny calculi.  Distal colonic diverticulosis. Abdominal x-ray reviewed by me shows shows hyperinflated lung fields without acute cardiopulmonary findings.  No evidence of obstruction. Twelve-lead EKG reviewed by me shows sinus rhythm    ED Course: Patient is a 71 year old female with a history of COPD and chronic respiratory failure who presents to the ER for evaluation of multiple symptoms which include nausea, vomiting, headache, cough and shortness of breath. Per EMS she had room air pulse oximetry of 85% and this improved following oxygen supplementation at 1 to 2 L. She continues to have refractory nausea and vomiting and has been unable to tolerate any oral intake. She will be referred to observation status for further evaluation.  Review of Systems: As per HPI otherwise all other systems reviewed and negative.    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 Her smoking use included cigarettes. She has a 43.00 pack-year smoking history. She has never used smokeless tobacco. She reports that she does not drink alcohol and does not use drugs.  Allergies  Allergen Reactions   Morphine And Related Other (See Comments)   Penicillins Rash   Sulfa Antibiotics Rash    Family History  Problem Relation Age of Onset   Breast cancer Paternal Aunt        ?  Prior to Admission medications   Medication Sig Start Date End Date Taking? Authorizing Provider  albuterol (VENTOLIN HFA) 108 (90 Base) MCG/ACT inhaler Inhale 2 puffs into the lungs every 6 (six) hours as needed for wheezing or shortness of breath. 11/07/20  Yes Martyn Ehrich, NP  Glycopyrrolate-Formoterol (BEVESPI AEROSPHERE) 9-4.8 MCG/ACT AERO Inhale 2 puffs  into the lungs 2 (two) times daily. 12/23/20  Yes Flora Lipps, MD  Ibuprofen 200 MG CAPS Take by mouth.   Yes [provider]  Baclofen 5 MG TABS Take 5 mg by mouth 2 (two) times daily as needed. Patient not taking: Reported on 10/01/2021 12/08/19   Laban Emperor, PA-C  CETIRIZINE HCL PO Take by mouth. Patient not taking: Reported on 10/01/2021    [provider]  montelukast (SINGULAIR) 10 MG tablet Take 1 tablet (10 mg total) by mouth daily. Patient not taking: Reported on 10/01/2021 11/07/20   Martyn Ehrich, NP  predniSONE (DELTASONE) 20 MG tablet Take one 20mg  tablet for 7 days. Patient not taking: Reported on 10/01/2021 08/11/21   Flora Lipps, MD    Physical Exam: Vitals:   10/01/21 1030 10/01/21 1130 10/01/21 1230 10/01/21 1330  BP: 128/75 129/73 131/74 132/75  Pulse: 91 91 89 (!) 107  Resp: (!) 30 20 (!) 22 (!) 22  Temp:      TempSrc:      SpO2: 92% 93% 92% 94%  Weight:      Height:         Vitals:   10/01/21 1030 10/01/21 1130 10/01/21 1230 10/01/21 1330  BP: 128/75 129/73 131/74 132/75  Pulse: 91 91 89 (!) 107  Resp: (!) 30 20 (!) 22 (!) 22  Temp:      TempSrc:      SpO2: 92% 93% 92% 94%  Weight:      Height:          Constitutional: Alert and oriented x 3 . Not in any apparent distress HEENT:      Head: Normocephalic and atraumatic.         Eyes: PERLA, EOMI, Conjunctivae are normal. Sclera is non-icteric.       Mouth/Throat: Mucous membranes are moist.       Neck: Supple with no signs of meningismus. Cardiovascular: Regular rate and rhythm. No murmurs, gallops, or rubs. 2+ symmetrical distal pulses are present . No JVD. No LE edema Respiratory: Tachypnea.scattered wheezes in both lung fields  Gastrointestinal: Soft, non tender, and non distended with positive bowel sounds.  Genitourinary: No CVA tenderness. Musculoskeletal: Nontender with normal range of motion in all extremities. No cyanosis, or erythema of extremities. Neurologic:  Face is  symmetric. Moving all extremities. No gross focal neurologic deficits . Skin: Skin is warm, dry.  No rash or ulcers Psychiatric: Mood and affect are normal    Labs on Admission: I have personally reviewed following labs and imaging studies  CBC: Recent Labs  Lab 10/01/21 0840  WBC 7.6  HGB 14.0  HCT 44.1  MCV 90.4  PLT 778   Basic Metabolic Panel: Recent Labs  Lab 10/01/21 0840  NA 138  K 3.5  CL 104  CO2 28  GLUCOSE 109*  BUN 13  CREATININE 0.57  CALCIUM 8.8*   GFR: Estimated Creatinine Clearance: 54.9 mL/min (by C-G formula based on SCr of 0.57 mg/dL). Liver Function Tests: Recent Labs  Lab 10/01/21 0840  AST 22  ALT 14  ALKPHOS 79  BILITOT 1.1  PROT 6.3*  ALBUMIN 3.7  Recent Labs  Lab 10/01/21 0840  LIPASE 30   No results for input(s): AMMONIA in the last 168 hours. Coagulation Profile: No results for input(s): INR, PROTIME in the last 168 hours. Cardiac Enzymes: No results for input(s): CKTOTAL, CKMB, CKMBINDEX, TROPONINI in the last 168 hours. BNP (last 3 results) No results for input(s): PROBNP in the last 8760 hours. HbA1C: No results for input(s): HGBA1C in the last 72 hours. CBG: No results for input(s): GLUCAP in the last 168 hours. Lipid Profile: No results for input(s): CHOL, HDL, LDLCALC, TRIG, CHOLHDL, LDLDIRECT in the last 72 hours. Thyroid Function Tests: No results for input(s): TSH, T4TOTAL, FREET4, T3FREE, THYROIDAB in the last 72 hours. Anemia Panel: No results for input(s): VITAMINB12, FOLATE, FERRITIN, TIBC, IRON, RETICCTPCT in the last 72 hours. Urine analysis:    Component Value Date/Time   COLORURINE YELLOW 10/01/2021 1243   APPEARANCEUR CLEAR (A) 10/01/2021 1243   APPEARANCEUR Clear 09/04/2014 1811   LABSPEC <1.005 (L) 10/01/2021 1243   LABSPEC 1.019 09/04/2014 1811   PHURINE 6.0 10/01/2021 1243   GLUCOSEU NEGATIVE 10/01/2021 1243   GLUCOSEU Negative 09/04/2014 1811   HGBUR NEGATIVE 10/01/2021 1243   BILIRUBINUR  NEGATIVE 10/01/2021 1243   BILIRUBINUR Negative 09/04/2014 1811   KETONESUR 15 (A) 10/01/2021 1243   PROTEINUR NEGATIVE 10/01/2021 1243   NITRITE NEGATIVE 10/01/2021 1243   LEUKOCYTESUR NEGATIVE 10/01/2021 1243   LEUKOCYTESUR Negative 09/04/2014 1811    Radiological Exams on Admission: CT ABDOMEN PELVIS W CONTRAST  Result Date: 10/01/2021 CLINICAL DATA:  Epigastric pain EXAM: CT ABDOMEN AND PELVIS WITH CONTRAST TECHNIQUE: Multidetector CT imaging of the abdomen and pelvis was performed using the standard protocol following bolus administration of intravenous contrast. RADIATION DOSE REDUCTION: This exam was performed according to the departmental dose-optimization program which includes automated exposure control, adjustment of the mA and/or kV according to patient size and/or use of iterative reconstruction technique. CONTRAST:  159mL OMNIPAQUE IOHEXOL 300 MG/ML  SOLN COMPARISON:  2015 FINDINGS: Lower chest: No acute abnormality. Hepatobiliary: Few scattered too small to characterize low-density lesions. Minimal dependent hyperdensity within the gallbladder may reflect sludge or tiny calculi. No biliary dilatation. Pancreas: Unremarkable. Spleen: Stable appearance with probable sequelae of prior trauma. Adrenals/Urinary Tract: Adrenals are unremarkable. Cyst at the lower pole the left kidney. Bladder is unremarkable. Stomach/Bowel: Stomach is within normal limits. Bowel is normal in caliber. Distal colonic diverticulosis. Postoperative changes are present at the rectum. Vascular/Lymphatic: Atherosclerosis.  No enlarged nodes. Reproductive: No pelvic mass. Other: No free fluid.  No acute abnormality of the abdominal wall. Musculoskeletal: Degenerative changes of the spine. IMPRESSION: No acute abnormality. Minimal layering gallbladder sludge or tiny calculi. Distal colonic diverticulosis. Atherosclerosis. Electronically Signed   By: Macy Mis M.D.   On: 10/01/2021 11:03   DG Abdomen Acute  W/Chest  Result Date: 10/01/2021 CLINICAL DATA:  Vomiting. EXAM: DG ABDOMEN ACUTE WITH 1 VIEW CHEST COMPARISON:  04/05/2014 FINDINGS: Lungs are hyperexpanded. The lungs are clear without focal pneumonia, edema, pneumothorax or pleural effusion. The cardiopericardial silhouette is within normal limits for size. Bones are diffusely demineralized. Telemetry leads overlie the chest. Upright film shows no evidence for intraperitoneal free air. Supine abdomen shows no gaseous bowel dilatation to suggest obstruction. Bones are diffusely demineralized. IMPRESSION: 1. Hyperexpansion without acute cardiopulmonary findings. 2. No evidence for bowel perforation or obstruction. Electronically Signed   By: Misty Stanley M.D.   On: 10/01/2021 09:18     Assessment/Plan Principal Problem:   COPD with acute exacerbation ([place])  Active Problems:   Acute gastritis     Patient is a 71 year old female admitted to the hospital for COPD exacerbation   COPD with acute exacerbation Place patient on scheduled and as needed bronchodilator therapy Place patient on systemic steroids Continue oxygen supplementation at 1 to 2 L to maintain pulse oximetry greater than 94%. Patient states that she was initially on 2 L of oxygen but that her pulmonologist dropped it down to 1 L continuous. She will need to be assessed prior to discharge and may have an increased oxygen requirement.    Acute gastritis Start patient on clear liquid diet Supportive care with antiemetics and IV PPI   DVT prophylaxis: Lovenox  Code Status: full code  Family Communication: Greater than 50% of time was spent discussing with patient on plan of care with her at the bedside.  All questions and concerns have been addressed.  She verbalizes understanding and agrees with the plan. Disposition Plan: Back to previous home environment Consults called: none  Status:Observation    Ayushi Pla MD [facility]

## 2021-10-01 NOTE — ED Provider Notes (Signed)
[facility] Provider Note    Event Date/Time   First MD Initiated Contact with Patient 10/01/21 [phone]     (approximate)   History   Emesis   HPI  Kelli Phillips is a 71 y.o. female with a history of COPD previous and remote colon surgery  Yesterday was in her normal health.  Reports she is doing well no concerns.  Then started to develop a little bit of a feeling of a sinus congestion, and then in the evening started feeling nauseated and reports she vomited up yellow multiple times.  She reports her stomach feels nauseated but she is not have any pain.  It comes and goes is alleviated by vomiting, but also she reports she has not been able to keep anything on her stomach.  She is feeling dehydrated and weak which prompted her to call EMS  EMS reports the patient with nausea and vomiting, did have some hypoxia when they walked her out to the ambulance but that resolved when placed on oxygen therapy.  Patient denies any chest pain headache or shortness of breath.  Reports her sinuses feel slightly congested.  She is up-to-date on COVID booster but has not had a flu shot this year.  No abdominal pain.  Reports intermittent nausea with yellow vomiting.  No diarrhea.  Has remote history of colon surgery but reports is not having any thing that feels like bowel or colon pain.  Denies history of obstruction  Currently feels dehydrated and slightly nauseated but does not wish for anything for pain.     Physical Exam   Triage Vital Signs: ED Triage Vitals  Enc Vitals Group     BP 10/01/21 0830 (!) 155/85     Pulse Rate 10/01/21 0830 82     Resp 10/01/21 0830 16     Temp 10/01/21 0838 98.3 F (36.8 C)     Temp Source 10/01/21 0838 Oral     SpO2 10/01/21 0828 (!) 85 %     Weight 10/01/21 0831 130 lb (59 kg)     Height 10/01/21 0831 5\' 4"  (1.626 m)     Head Circumference --      Peak Flow --      Pain Score 10/01/21 0830 0     Pain Loc --      Pain Edu?  --      Excl. in [place]? --     Most recent vital signs: Vitals:   10/01/21 1230 10/01/21 1330  BP: 131/74 132/75  Pulse: 89 (!) 107  Resp: (!) 22 (!) 22  Temp:    SpO2: 92% 94%     General: Awake, no distress.  Very pleasant.  Conversant fully oriented CV:  Good peripheral perfusion.  Normal heart tones.  No murmurs rubs or gallops Resp:  Normal effort.  Respiratory rate about 16-18.  She is noted to have oxygen saturation in the mid 80s on room air but placed on 1 L nasal cannula saturation is in the mid to high 90s which she reports is her baseline.  She has diminished lung sounds throughout but no noted wheezing, and has slight prolonged expiratory phase but no distress.  No crackles.  No rhonchi.  She is somewhat barrel chested in appearance but does not appear in distress Abd:  No distention.  Denies pain to palpation across all quadrants of the abdomen.  Reports palpation of the epigastrium causes a slight nauseated feeling.  No rebound or guarding  in any quadrant.  No hernias or masses.  Evidence of old midline abdominal surgery without evidence of mass Other:     ED Results / Procedures / Treatments   Labs (all labs ordered are listed, but only abnormal results are displayed) Labs Reviewed  COMPREHENSIVE METABOLIC PANEL - Abnormal; Notable for the following components:      Result Value   Glucose, Bld 109 (*)    Calcium 8.8 (*)    Total Protein 6.3 (*)    All other components within normal limits  URINALYSIS, COMPLETE (UACMP) WITH MICROSCOPIC - Abnormal; Notable for the following components:   APPearance CLEAR (*)    Specific Gravity, Urine <1.005 (*)    Ketones, ur 15 (*)    All other components within normal limits  RESP PANEL BY RT-PCR (FLU A&B, COVID) ARPGX2  CBC  LIPASE, BLOOD  LACTIC ACID, PLASMA     EKG  EKG was reviewed and personally interpreted by me.  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   RADIOLOGY  New abdominal three-way personally viewed by me, no evidence of infiltrate in the lungs and I do not see obstructive finding in the abdomen  CT abdomen pelvis reviewed by me, negative for acute finding.  Radiologist does report some gallbladder sludge and layering.  Diverticulosis of the colon.  Atherosclerosis.   PROCEDURES:  Critical Care performed: No  Procedures   MEDICATIONS ORDERED IN ED: Medications  arformoterol (BROVANA) nebulizer solution 15 mcg (has no administration in time range)    And  umeclidinium bromide (INCRUSE ELLIPTA) 62.5 MCG/ACT 1 puff (has no administration in time range)  methylPREDNISolone sodium succinate (SOLU-MEDROL) 40 mg/mL injection 40 mg (has no administration in time range)    Followed by  predniSONE (DELTASONE) tablet 40 mg (has no administration in time range)  ipratropium-albuterol (DUONEB) 0.5-2.5 (3) MG/3ML nebulizer solution 3 mL (has no administration in time range)  enoxaparin (LOVENOX) injection 40 mg (has no administration in time range)  0.9 %  sodium chloride infusion ( Intravenous New Bag/Given 10/01/21 1615)  acetaminophen (TYLENOL) tablet 650 mg (has no administration in time range)    Or  acetaminophen (TYLENOL) suppository 650 mg (has no administration in time range)  ondansetron (ZOFRAN) tablet 4 mg (has no administration in time range)    Or  ondansetron (ZOFRAN) injection 4 mg (has no administration in time range)  pantoprazole (PROTONIX) injection 40 mg (40 mg Intravenous Given 10/01/21 1613)  fluticasone (FLONASE) 50 MCG/ACT nasal spray 1 spray (1 spray Each Nare Patient Refused/Not Given 10/01/21 1613)  sodium chloride 0.9 % bolus 1,000 mL (0 mLs Intravenous Stopped 10/01/21 1008)  ondansetron (ZOFRAN) injection 4 mg (4 mg Intravenous Given 10/01/21 0845)  ipratropium-albuterol (DUONEB) 0.5-2.5 (3) MG/3ML nebulizer solution 3 mL (3 mLs Nebulization Given 10/01/21 0846)  ondansetron (ZOFRAN) injection 4 mg (4 mg  Intravenous Given 10/01/21 1008)  sodium chloride 0.9 % bolus 500 mL (0 mLs Intravenous Stopped 10/01/21 1444)  iohexol (OMNIPAQUE) 300 MG/ML solution 100 mL (100 mLs Intravenous Contrast Given 10/01/21 1038)  ipratropium-albuterol (DUONEB) 0.5-2.5 (3) MG/3ML nebulizer solution 3 mL (3 mLs Nebulization Given 10/01/21 1257)  predniSONE (DELTASONE) tablet 40 mg (40 mg Oral Given 10/01/21 1256)  ipratropium-albuterol (DUONEB) 0.5-2.5 (3) MG/3ML nebulizer solution 3 mL (3 mLs Nebulization Given 10/01/21 1354)  ketorolac (TORADOL) 15 MG/ML injection 15 mg (15 mg Intravenous Given 10/01/21 1613)  promethazine (PHENERGAN) 12.5 mg in sodium chloride 0.9 %  1,000 mL infusion (12.5 mg Intravenous New Bag/Given 10/01/21 1616)     IMPRESSION / MDM / ASSESSMENT AND PLAN / ED COURSE  I reviewed the triage vital signs and the nursing notes.                              Differential diagnosis includes, but is not limited to, viral illness, gastroenteritis, COVID influenza, pneumonia, pancreatitis hepatitis bowel obstruction, gastritis etc.  Possible sinusitis  Personally viewed the patient's imaging and lab results.  Personally interpreted.   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 [MQ]  [phone] Patient reports nausea is better.  Reexamination she does report now some abdominal tenderness across the epigastrium.  Given her previous history of colonic surgery and her several episodes of vomiting during the evening, discussed with the patient and will proceed with CT imaging to evaluate further in particular to evaluate for acute intra-abdominal pathology.  Overall she is awake and alert reports her breathing feels totally normal.  She is  starting to feel better still feels a little bit generally weak.  Mild nausea. [MQ]    Clinical Course User Index [MQ] Delman Kitten, MD   Reviewed patient's old records including pulmonologist visit with Dr. Mortimer Fries from November, patient has known severe COPD.  Uses home oxygen.  Patient reports that she is currently using 1 L home oxygen, and that she does walk about a mile and 1/2 to 2 miles.  Today she denies shortness of breath.  EMS did note hypoxia which quickly resolved when they walked her to the ambulance but she was not on oxygen.  Patient reports she felt weak and dehydrated  No associated chest pain.  Denies shortness of breath or wheezing.  Patient does not feel short of breath at this time.  We will give 1 DuoNeb though as lungs are somewhat diminished, but favor this may be chronic.  She is currently saturating in the high 90s on 1 L nasal cannula with comfortable work of breathing in the ER  ----------------------------------------- 2:10 PM on 10/01/2021 -----------------------------------------  I have placed a consultation with our hospitalist team Dr. Francine Graven.  After discussing and reviewing the case patient has been decided for admission due to the ongoing nature of her symptomatology, COPD exacerbation and nausea vomiting  FINAL CLINICAL IMPRESSION(S) / ED DIAGNOSES   Final diagnoses:  Nausea and vomiting, unspecified vomiting type  COPD with acute exacerbation (HCC)  Acute non-recurrent sinusitis, unspecified location     Rx / DC Orders   ED Discharge Orders     None        Note:  This document was prepared using Dragon voice recognition software and may include unintentional dictation errors.   Delman Kitten, MD 10/01/21 [phone]

## 2021-10-02 DIAGNOSIS — Z87891 Personal history of nicotine dependence: Secondary | ICD-10-CM | POA: Diagnosis not present

## 2021-10-02 DIAGNOSIS — G43909 Migraine, unspecified, not intractable, without status migrainosus: Secondary | ICD-10-CM | POA: Diagnosis present

## 2021-10-02 DIAGNOSIS — K2900 Acute gastritis without bleeding: Secondary | ICD-10-CM | POA: Diagnosis present

## 2021-10-02 DIAGNOSIS — Z20822 Contact with and (suspected) exposure to covid-19: Secondary | ICD-10-CM | POA: Diagnosis present

## 2021-10-02 DIAGNOSIS — Z85828 Personal history of other malignant neoplasm of skin: Secondary | ICD-10-CM | POA: Diagnosis not present

## 2021-10-02 DIAGNOSIS — Z9981 Dependence on supplemental oxygen: Secondary | ICD-10-CM | POA: Diagnosis not present

## 2021-10-02 DIAGNOSIS — Z880 Allergy status to penicillin: Secondary | ICD-10-CM | POA: Diagnosis not present

## 2021-10-02 DIAGNOSIS — J9601 Acute respiratory failure with hypoxia: Secondary | ICD-10-CM | POA: Diagnosis present

## 2021-10-02 DIAGNOSIS — J019 Acute sinusitis, unspecified: Secondary | ICD-10-CM | POA: Diagnosis present

## 2021-10-02 DIAGNOSIS — Z79899 Other long term (current) drug therapy: Secondary | ICD-10-CM | POA: Diagnosis not present

## 2021-10-02 DIAGNOSIS — Z885 Allergy status to narcotic agent status: Secondary | ICD-10-CM | POA: Diagnosis not present

## 2021-10-02 DIAGNOSIS — J9621 Acute and chronic respiratory failure with hypoxia: Secondary | ICD-10-CM | POA: Diagnosis present

## 2021-10-02 DIAGNOSIS — J441 Chronic obstructive pulmonary disease with (acute) exacerbation: Secondary | ICD-10-CM | POA: Diagnosis present

## 2021-10-02 DIAGNOSIS — Z882 Allergy status to sulfonamides status: Secondary | ICD-10-CM | POA: Diagnosis not present

## 2021-10-02 LAB — BASIC METABOLIC PANEL
Anion gap: 3 — ABNORMAL LOW (ref 5–15)
BUN: 13 mg/dL (ref 8–23)
CO2: 26 mmol/L (ref 22–32)
Calcium: 8.7 mg/dL — ABNORMAL LOW (ref 8.9–10.3)
Chloride: 113 mmol/L — ABNORMAL HIGH (ref 98–111)
Creatinine, Ser: 0.57 mg/dL (ref 0.44–1.00)
GFR, Estimated: >60 mL/min (ref >60)
Glucose, Bld: 126 mg/dL — ABNORMAL HIGH (ref 70–99)
Potassium: 4.3 mmol/L (ref 3.5–5.1)
Sodium: 142 mmol/L (ref 135–145)

## 2021-10-02 LAB — CBC
HCT: 37.2 % (ref 36.0–46.0)
Hemoglobin: 12 g/dL (ref 12.0–15.0)
MCH: 29.2 pg (ref 26.0–34.0)
MCHC: 32.3 g/dL (ref 30.0–36.0)
MCV: 90.5 fL (ref 80.0–100.0)
Platelets: 142 10*3/uL — ABNORMAL LOW (ref 150–400)
RBC: 4.11 MIL/uL (ref 3.87–5.11)
RDW: 13.3 % (ref 11.5–15.5)
WBC: 5.6 10*3/uL (ref 4.0–10.5)
nRBC: 0 % (ref 0.0–0.2)

## 2021-10-02 MED ORDER — AZITHROMYCIN 500 MG PO TABS
500.0000 mg | ORAL_TABLET | Freq: Once | ORAL | Status: AC
  Administered 2021-10-02: 500 mg via ORAL
  Filled 2021-10-02: qty 1

## 2021-10-02 MED ORDER — PANTOPRAZOLE SODIUM 40 MG PO TBEC
40.0000 mg | DELAYED_RELEASE_TABLET | Freq: Every day | ORAL | Status: DC
  Administered 2021-10-03: 08:00:00 40 mg via ORAL
  Filled 2021-10-02: qty 1

## 2021-10-02 MED ORDER — IPRATROPIUM-ALBUTEROL 0.5-2.5 (3) MG/3ML IN SOLN
3.0000 mL | Freq: Four times a day (QID) | RESPIRATORY_TRACT | Status: DC
  Administered 2021-10-02 – 2021-10-03 (×2): 3 mL via RESPIRATORY_TRACT
  Filled 2021-10-02 (×2): qty 3

## 2021-10-02 MED ORDER — IBUPROFEN 400 MG PO TABS: 400.0000 mg | ORAL_TABLET | Freq: Two times a day (BID) | ORAL | Status: DC | PRN

## 2021-10-02 NOTE — Progress Notes (Signed)
°  Patient Saturations on Room Air at Rest = 83%  Patient Saturations on [employer] while Ambulating = 80%  Patient Saturations on 3 Liters of oxygen while Ambulating = 93%  Please briefly explain why patient needs home oxygen:

## 2021-10-02 NOTE — Progress Notes (Signed)
PROGRESS NOTE    Kelli Phillips  PZW:[id number] DOB: 1950-09-08 DOA: 10/01/2021 PCP: No primary care provider on file.  108A/108A-AA   Assessment & Plan:   Principal Problem:   COPD with acute exacerbation ([place]) Active Problems:   Acute gastritis   Acute hypoxemic respiratory failure (HCC)   Kelli Phillips is a 71 y.o. female with medical history significant for COPD with chronic respiratory failure on 1 L of oxygen continuous, history of migraine headaches who presents to the emergency room via EMS for evaluation of refractory nausea, vomiting and a frontal headache.   N/V --per pt, N/V resulted from her migraine headache, both have resolved. --supportive care --advance diet  Headache --pt reported hx of having headache and seeing stars, with associated N/V.  Pt takes Advil sinus for her headache at home. --Advil PRN for headache  Acute on chronic hypoxemic respiratory failure --Pt reported she used to be on 2L O2, but her doctor had reduced to 1L. --Desat to 80% on room air with ambulating, though pt denied feeling very short of breath.  needed 3L O2.    COPD with acute exacerbation --had wheezing and tachypnea on admission.  Started on IV solumedrol and scheduled Nebs. Plan: --cont steroid --cont DuoNeb QID --cont daily bronchodilators --start azithromycin   DVT prophylaxis: Lovenox SQ Code Status: Full code  Family Communication:  Level of care: Med-Surg Dispo:   The patient is from: home Anticipated d/c is to: home Anticipated d/c date is: likely tomorrow Patient currently is not medically ready to d/c due to: being treated for COPD exacerbation with increased O2 requirement   Subjective and Interval History:  N/V and headache all improved.  Reported continued sinus draining.  Pt found to desat to 80% on room air while ambulating.     Objective: Vitals:   10/02/21 0507 10/02/21 0740 10/02/21 1241 10/02/21 1646  BP: 132/71 124/63 138/79 (!) 141/68   Pulse: 71 76 77 78  Resp: 16 18 18 18   Temp: 98.6 F (37 C) 98.5 F (36.9 C) 98.9 F (37.2 C) 98.1 F (36.7 C)  TempSrc:      SpO2: 95% 95% 91% 94%  Weight:      Height:        Intake/Output Summary (Last 24 hours) at 10/02/2021 1928 Last data filed at 10/02/2021 1838 Gross per 24 hour  Intake 1518.82 ml  Output --  Net 1518.82 ml   Filed Weights   10/01/21 0831  Weight: 59 kg    Examination:   Constitutional: NAD, AAOx3 HEENT: conjunctivae and lids normal, EOMI CV: No cyanosis.   RESP: normal respiratory effort, no wheezes, decreased air movement, on 3L Extremities: No effusions, edema in BLE SKIN: warm, dry Neuro: II - XII grossly intact.   Psych: Normal mood and affect.  Appropriate judgement and reason   Data Reviewed: I have personally reviewed following labs and imaging studies  CBC: Recent Labs  Lab 10/01/21 0840 10/02/21 0506  WBC 7.6 5.6  HGB 14.0 12.0  HCT 44.1 37.2  MCV 90.4 90.5  PLT 168 423*   Basic Metabolic Panel: Recent Labs  Lab 10/01/21 0840 10/02/21 0506  NA 138 142  K 3.5 4.3  CL 104 113*  CO2 28 26  GLUCOSE 109* 126*  BUN 13 13  CREATININE 0.57 0.57  CALCIUM 8.8* 8.7*   GFR: Estimated Creatinine Clearance: 54.9 mL/min (by C-G formula based on SCr of 0.57 mg/dL). Liver Function Tests: Recent Labs  Lab 10/01/21 0840  AST 22  ALT 14  ALKPHOS 79  BILITOT 1.1  PROT 6.3*  ALBUMIN 3.7   Recent Labs  Lab 10/01/21 0840  LIPASE 30   No results for input(s): AMMONIA in the last 168 hours. Coagulation Profile: No results for input(s): INR, PROTIME in the last 168 hours. Cardiac Enzymes: No results for input(s): CKTOTAL, CKMB, CKMBINDEX, TROPONINI in the last 168 hours. BNP (last 3 results) No results for input(s): PROBNP in the last 8760 hours. HbA1C: No results for input(s): HGBA1C in the last 72 hours. CBG: No results for input(s): GLUCAP in the last 168 hours. Lipid Profile: No results for input(s): CHOL, HDL,  LDLCALC, TRIG, CHOLHDL, LDLDIRECT in the last 72 hours. Thyroid Function Tests: No results for input(s): TSH, T4TOTAL, FREET4, T3FREE, THYROIDAB in the last 72 hours. Anemia Panel: No results for input(s): VITAMINB12, FOLATE, FERRITIN, TIBC, IRON, RETICCTPCT in the last 72 hours. Sepsis Labs: Recent Labs  Lab 10/01/21 0840  LATICACIDVEN 1.2    Recent Results (from the past 240 hour(s))  Resp Panel by RT-PCR (Flu A&B, Covid) Nasopharyngeal Swab     Status: None   Collection Time: 10/01/21  8:40 AM   Specimen: Nasopharyngeal Swab; Nasopharyngeal(NP) swabs in vial transport medium  Result Value Ref Range Status   SARS Coronavirus 2 by RT PCR NEGATIVE NEGATIVE Final    Comment: (NOTE) SARS-CoV-2 target nucleic acids are NOT DETECTED.  The SARS-CoV-2 RNA is generally detectable in upper respiratory specimens during the acute phase of infection. The lowest concentration of SARS-CoV-2 viral copies this assay can detect is 138 copies/mL. A negative result does not preclude SARS-Cov-2 infection and should not be used as the sole basis for treatment or other patient management decisions. A negative result may occur with  improper specimen collection/handling, submission of specimen other than nasopharyngeal swab, presence of viral mutation(s) within the areas targeted by this assay, and inadequate number of viral copies(<138 copies/mL). A negative result must be combined with clinical observations, patient history, and epidemiological information. The expected result is Negative.  Fact Sheet for Patients:  EntrepreneurPulse.com.au  Fact Sheet for Healthcare Providers:  IncredibleEmployment.be  This test is no t yet approved or cleared by the [place] FDA and  has been authorized for detection and/or diagnosis of SARS-CoV-2 by FDA under an Emergency Use Authorization (EUA). This EUA will remain  in effect (meaning this test can be used) for the  duration of the COVID-19 declaration under Section 564(b)(1) of the Act, 21 U.S.C.section 360bbb-3(b)(1), unless the authorization is terminated  or revoked sooner.       Influenza A by PCR NEGATIVE NEGATIVE Final   Influenza B by PCR NEGATIVE NEGATIVE Final    Comment: (NOTE) The Xpert Xpress SARS-CoV-2/FLU/RSV plus assay is intended as an aid in the diagnosis of influenza from Nasopharyngeal swab specimens and should not be used as a sole basis for treatment. Nasal washings and aspirates are unacceptable for Xpert Xpress SARS-CoV-2/FLU/RSV testing.  Fact Sheet for Patients: EntrepreneurPulse.com.au  Fact Sheet for Healthcare Providers: IncredibleEmployment.be  This test is not yet approved or cleared by the [place] FDA and has been authorized for detection and/or diagnosis of SARS-CoV-2 by FDA under an Emergency Use Authorization (EUA). This EUA will remain in effect (meaning this test can be used) for the duration of the COVID-19 declaration under Section 564(b)(1) of the Act, 21 U.S.C. section 360bbb-3(b)(1), unless the authorization is terminated or revoked.  Performed at [facility], [place], [place]  [address]       Radiology Studies: CT ABDOMEN PELVIS W CONTRAST  Result Date: 10/01/2021 CLINICAL DATA:  Epigastric pain EXAM: CT ABDOMEN AND PELVIS WITH CONTRAST TECHNIQUE: Multidetector CT imaging of the abdomen and pelvis was performed using the standard protocol following bolus administration of intravenous contrast. RADIATION DOSE REDUCTION: This exam was performed according to the departmental dose-optimization program which includes automated exposure control, adjustment of the mA and/or kV according to patient size and/or use of iterative reconstruction technique. CONTRAST:  159mL OMNIPAQUE IOHEXOL 300 MG/ML  SOLN COMPARISON:  2015 FINDINGS: Lower chest: No acute abnormality. Hepatobiliary: Few  scattered too small to characterize low-density lesions. Minimal dependent hyperdensity within the gallbladder may reflect sludge or tiny calculi. No biliary dilatation. Pancreas: Unremarkable. Spleen: Stable appearance with probable sequelae of prior trauma. Adrenals/Urinary Tract: Adrenals are unremarkable. Cyst at the lower pole the left kidney. Bladder is unremarkable. Stomach/Bowel: Stomach is within normal limits. Bowel is normal in caliber. Distal colonic diverticulosis. Postoperative changes are present at the rectum. Vascular/Lymphatic: Atherosclerosis.  No enlarged nodes. Reproductive: No pelvic mass. Other: No free fluid.  No acute abnormality of the abdominal wall. Musculoskeletal: Degenerative changes of the spine. IMPRESSION: No acute abnormality. Minimal layering gallbladder sludge or tiny calculi. Distal colonic diverticulosis. Atherosclerosis. Electronically Signed   By: Macy Mis M.D.   On: 10/01/2021 11:03   DG Abdomen Acute W/Chest  Result Date: 10/01/2021 CLINICAL DATA:  Vomiting. EXAM: DG ABDOMEN ACUTE WITH 1 VIEW CHEST COMPARISON:  04/05/2014 FINDINGS: Lungs are hyperexpanded. The lungs are clear without focal pneumonia, edema, pneumothorax or pleural effusion. The cardiopericardial silhouette is within normal limits for size. Bones are diffusely demineralized. Telemetry leads overlie the chest. Upright film shows no evidence for intraperitoneal free air. Supine abdomen shows no gaseous bowel dilatation to suggest obstruction. Bones are diffusely demineralized. IMPRESSION: 1. Hyperexpansion without acute cardiopulmonary findings. 2. No evidence for bowel perforation or obstruction. Electronically Signed   By: Misty Stanley M.D.   On: 10/01/2021 09:18     Scheduled Meds:  arformoterol  15 mcg Nebulization BID   And   umeclidinium bromide  1 puff Inhalation Daily   enoxaparin (LOVENOX) injection  40 mg Subcutaneous Q24H   fluticasone  1 spray Each Nare BID   pantoprazole  40 mg  Oral Daily   [START ON 10/03/2021] predniSONE  40 mg Oral Q breakfast   Continuous Infusions:   LOS: 0 days     Enzo Bi, MD [facility] If 7PM-7AM, please contact night-coverage 10/02/2021, 7:28 PM

## 2021-10-03 ENCOUNTER — Encounter: Payer: Self-pay | Admitting: Hospitalist

## 2021-10-03 LAB — CBC
HCT: 37.9 % (ref 36.0–46.0)
Hemoglobin: 12.1 g/dL (ref 12.0–15.0)
MCH: 28.8 pg (ref 26.0–34.0)
MCHC: 31.9 g/dL (ref 30.0–36.0)
MCV: 90.2 fL (ref 80.0–100.0)
Platelets: 163 10*3/uL (ref 150–400)
RBC: 4.2 MIL/uL (ref 3.87–5.11)
RDW: 13.7 % (ref 11.5–15.5)
WBC: 9 10*3/uL (ref 4.0–10.5)
nRBC: 0 % (ref 0.0–0.2)

## 2021-10-03 LAB — BASIC METABOLIC PANEL
Anion gap: 4 — ABNORMAL LOW (ref 5–15)
BUN: 20 mg/dL (ref 8–23)
CO2: 31 mmol/L (ref 22–32)
Calcium: 8.9 mg/dL (ref 8.9–10.3)
Chloride: 105 mmol/L (ref 98–111)
Creatinine, Ser: 0.79 mg/dL (ref 0.44–1.00)
GFR, Estimated: >60 mL/min (ref >60)
Glucose, Bld: 90 mg/dL (ref 70–99)
Potassium: 3.7 mmol/L (ref 3.5–5.1)
Sodium: 140 mmol/L (ref 135–145)

## 2021-10-03 LAB — MAGNESIUM: Magnesium: 2 mg/dL (ref 1.7–2.4)

## 2021-10-03 MED ORDER — PREDNISONE 20 MG PO TABS: 40.0000 mg | ORAL_TABLET | Freq: Every day | ORAL | 0 refills | Status: AC

## 2021-10-03 MED ORDER — AZITHROMYCIN 500 MG PO TABS: 500.0000 mg | ORAL_TABLET | Freq: Every day | ORAL | 0 refills | Status: AC

## 2021-10-03 MED ORDER — AZITHROMYCIN 500 MG PO TABS
500.0000 mg | ORAL_TABLET | Freq: Every day | ORAL | Status: DC
  Administered 2021-10-03: 500 mg via ORAL
  Filled 2021-10-03: qty 1

## 2021-10-03 MED ORDER — IPRATROPIUM-ALBUTEROL 0.5-2.5 (3) MG/3ML IN SOLN: 3.0000 mL | Freq: Four times a day (QID) | RESPIRATORY_TRACT | 2 refills | Status: DC | PRN

## 2021-10-03 NOTE — Progress Notes (Signed)
Pt refused o2 tanks, states that she has them at home, despite education, pt states that she will not need any o2 to get her home, states that she " I have everything at home" and that " I know what im doing", pts ride here, pt assisted in wheelchair to her ride

## 2021-10-03 NOTE — TOC Progression Note (Signed)
Transition of Care [facility]) - Progression Note    Patient Details  Name: Kelli Phillips MRN: [id number] Date of Birth: 03-26-1951  Transition of [facility]) CM/SW Contact  Eileen Stanford, LCSW Phone Number: 10/03/2021, 10:09 AM  Clinical Narrative:    02 and Nebs ordered through [employer] and will be delievered to bedside. CSW will provide pulse ox at bedside.        Expected Discharge Plan and Services           Expected Discharge Date: 10/03/21                                     Social Determinants of Health (SDOH) Interventions    Readmission Risk Interventions No flowsheet data found.

## 2021-10-03 NOTE — Discharge Summary (Signed)
Physician Discharge Summary   Kelli Phillips  female DOB: 1951/05/16  FAO:[id number]  PCP: No primary care provider on file.  Admit date: 10/01/2021 Discharge date: 10/03/2021  Admitted From: home Disposition:  home CODE STATUS: Full code   Hospital Course:  For full details, please see H&P, progress notes, consult notes and ancillary notes.  Briefly,  Kelli Phillips is a 71 y.o. 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   N/V --per pt, N/V resulted from her migraine headache, both have resolved.  Pt was tolerating diet prior to discharge.   Headache --pt reported hx of having headache and seeing stars, with associated N/V.  Pt takes Advil sinus for her headache at home.  Headache resolved prior to discharge.   Acute on chronic hypoxemic respiratory failure --Pt reported she used to be on 2L O2, but her doctor had reduced to 1L. --Desat to 80% on room air with ambulating, though pt denied feeling very short of breath.  needed 3L O2 with ambulation, so supplemental O2 order updated to reflect increased need.   COPD with acute exacerbation --had wheezing and tachypnea on admission.  Started on IV solumedrol and scheduled Nebs. --Pt did not have significant respiratory issue, so was discharged home to finish a short course of prednisone and azithromycin. --cont home daily bronchodilators  Unless noted above, stopped medications were ones pt was not taking PTA.   Discharge Diagnoses:  Principal Problem:   COPD with acute exacerbation ([place]) Active Problems:   Acute gastritis   Acute hypoxemic respiratory failure ([place])   30 Day Unplanned Readmission Risk Score    Flowsheet Row ED to Hosp-Admission (Current) from 10/01/2021 in [place]  (1C)  30 Day Unplanned Readmission Risk Score (%) 7.12 Filed at 10/03/2021 0801       This score is the patient's risk of an unplanned readmission within 30 days of being discharged (0 -100%). The score is based on dignosis, age, lab data, medications, orders, and past utilization.   Low:  0-14.9   Medium: 15-21.9   High: 22-29.9   Extreme: 30 and above         Discharge Instructions:  Allergies as of 10/03/2021       Reactions   Morphine And Related Other (See Comments)   Penicillins Rash   Sulfa Antibiotics Rash        Medication List     STOP taking these medications    Baclofen 5 MG Tabs   CETIRIZINE HCL PO   montelukast 10 MG tablet Commonly known as: SINGULAIR       TAKE these medications    albuterol 108 (90 Base) MCG/ACT inhaler Commonly known as: VENTOLIN HFA Inhale 2 puffs into the lungs every 6 (six) hours as needed for wheezing or shortness of breath.   azithromycin 500 MG tablet Commonly known as: ZITHROMAX Take 1 tablet (500 mg total) by mouth daily for 3 days. Start taking on: October 04, 2021   Bevespi Aerosphere 9-4.8 MCG/ACT Aero Generic drug: Glycopyrrolate-Formoterol Inhale 2 puffs into the lungs 2 (two) times daily.   Ibuprofen 200 MG Caps Take by mouth.   predniSONE 20 MG tablet Commonly known as: DELTASONE Take 2 tablets (40 mg total) by mouth daily with breakfast for 2 days. Start taking on:  October 04, 2021 What changed:  how much to take how to take this when to take this additional instructions         Follow-up Information     Your PCP Follow up in 1 week(s).                  Allergies  Allergen Reactions   Morphine And Related Other (See Comments)   Penicillins Rash   Sulfa Antibiotics Rash     The results of significant diagnostics from this hospitalization (including imaging, microbiology, ancillary and laboratory) are listed below for reference.   Consultations:   Procedures/Studies: CT  ABDOMEN PELVIS W CONTRAST  Result Date: 10/01/2021 CLINICAL DATA:  Epigastric pain EXAM: CT ABDOMEN AND PELVIS WITH CONTRAST TECHNIQUE: Multidetector CT imaging of the abdomen and pelvis was performed using the standard protocol following bolus administration of intravenous contrast. RADIATION DOSE REDUCTION: This exam was performed according to the departmental dose-optimization program which includes automated exposure control, adjustment of the mA and/or kV according to patient size and/or use of iterative reconstruction technique. CONTRAST:  167mL OMNIPAQUE IOHEXOL 300 MG/ML  SOLN COMPARISON:  2015 FINDINGS: Lower chest: No acute abnormality. Hepatobiliary: Few scattered too small to characterize low-density lesions. Minimal dependent hyperdensity within the gallbladder may reflect sludge or tiny calculi. No biliary dilatation. Pancreas: Unremarkable. Spleen: Stable appearance with probable sequelae of prior trauma. Adrenals/Urinary Tract: Adrenals are unremarkable. Cyst at the lower pole the left kidney. Bladder is unremarkable. Stomach/Bowel: Stomach is within normal limits. Bowel is normal in caliber. Distal colonic diverticulosis. Postoperative changes are present at the rectum. Vascular/Lymphatic: Atherosclerosis.  No enlarged nodes. Reproductive: No pelvic mass. Other: No free fluid.  No acute abnormality of the abdominal wall. Musculoskeletal: Degenerative changes of the spine. IMPRESSION: No acute abnormality. Minimal layering gallbladder sludge or tiny calculi. Distal colonic diverticulosis. Atherosclerosis. Electronically Signed   By: Macy Mis M.D.   On: 10/01/2021 11:03   DG Abdomen Acute W/Chest  Result Date: 10/01/2021 CLINICAL DATA:  Vomiting. EXAM: DG ABDOMEN ACUTE WITH 1 VIEW CHEST COMPARISON:  04/05/2014 FINDINGS: Lungs are hyperexpanded. The lungs are clear without focal pneumonia, edema, pneumothorax or pleural effusion. The cardiopericardial silhouette is within normal limits for  size. Bones are diffusely demineralized. Telemetry leads overlie the chest. Upright film shows no evidence for intraperitoneal free air. Supine abdomen shows no gaseous bowel dilatation to suggest obstruction. Bones are diffusely demineralized. IMPRESSION: 1. Hyperexpansion without acute cardiopulmonary findings. 2. No evidence for bowel perforation or obstruction. Electronically Signed   By: Misty Stanley M.D.   On: 10/01/2021 09:18   CT CHEST LUNG CA SCREEN LOW DOSE W/O CM  Result Date: 09/13/2021 CLINICAL DATA:  71 year old female former smoker (quit 13 years ago) with 44 pack-year history of smoking. Lung cancer screening examination. EXAM: CT CHEST WITHOUT CONTRAST LOW-DOSE FOR LUNG CANCER SCREENING TECHNIQUE: Multidetector CT imaging of the chest was performed following the standard protocol without IV contrast. RADIATION DOSE REDUCTION: This exam was performed according to the departmental dose-optimization program which includes automated exposure control, adjustment of the mA and/or kV according to patient size and/or use of iterative reconstruction technique. COMPARISON:  Low-dose lung cancer screening chest CT 07/03/2020. FINDINGS: Cardiovascular: Heart size is normal. There is no significant pericardial fluid, thickening or pericardial calcification. There is aortic atherosclerosis, as well as atherosclerosis of the great vessels of the mediastinum and the coronary arteries, including calcified atherosclerotic plaque in the left main and left anterior descending coronary arteries. Ectasia of  ascending thoracic aorta (4.0 cm in diameter). Mediastinum/Nodes: No pathologically enlarged mediastinal or hilar lymph nodes. Please note that accurate exclusion of hilar adenopathy is limited on noncontrast CT scans. Esophagus is unremarkable in appearance. No axillary lymphadenopathy. Lungs/Pleura: Multiple small pulmonary nodules are again noted in the lungs bilaterally, largest of which is in the superior  segment of the right lower lobe abutting the major fissure (axial image 107 of series 3), with a volume derived mean diameter of 6.2 mm, similar to prior studies. No other larger more suspicious appearing pulmonary nodules or masses are noted. No acute consolidative airspace disease. No pleural effusions. Mild diffuse bronchial wall thickening with mild to moderate centrilobular and paraseptal emphysema. Upper Abdomen: Aortic atherosclerosis. Calcifications in the spleen, potentially related to remote trauma, similar to the prior examination. Musculoskeletal: There are no aggressive appearing lytic or blastic lesions noted in the visualized portions of the skeleton. IMPRESSION: 1. Lung-RADS 2S, benign appearance or behavior. Continue annual screening with low-dose chest CT without contrast in 12 months. 2. The "S" modifier above refers to potentially clinically significant non lung cancer related findings. Specifically, there is aortic atherosclerosis, in addition to left main and 2 vessel coronary artery disease. Please note that although the presence of coronary artery calcium documents the presence of coronary artery disease, the severity of this disease and any potential stenosis cannot be assessed on this non-gated CT examination. Assessment for potential risk factor modification, dietary therapy or pharmacologic therapy may be warranted, if clinically indicated. 3. Mild diffuse bronchial wall thickening with mild to moderate centrilobular and paraseptal emphysema; imaging findings suggestive of underlying COPD. 4. 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 Continued attention at time of repeat annual low-dose lung cancer screening chest CT is recommended. Aortic Atherosclerosis (ICD10-I70.0) and Emphysema (ICD10-J43.9). Electronically Signed   By: Vinnie Langton M.D.   On: 09/13/2021 08:05     Labs: BNP (last 3 results) No results for input(s): BNP in the last 8760 hours. Basic  Metabolic Panel: Recent Labs  Lab 10/01/21 0840 10/02/21 0506 10/03/21 0451  NA 138 142 140  K 3.5 4.3 3.7  CL 104 113* 105  CO2 28 26 31   GLUCOSE 109* 126* 90  BUN 13 13 20   CREATININE 0.57 0.57 0.79  CALCIUM 8.8* 8.7* 8.9  MG  --   --  2.0   Liver Function Tests: Recent Labs  Lab 10/01/21 0840  AST 22  ALT 14  ALKPHOS 79  BILITOT 1.1  PROT 6.3*  ALBUMIN 3.7   Recent Labs  Lab 10/01/21 0840  LIPASE 30   No results for input(s): AMMONIA in the last 168 hours. CBC: Recent Labs  Lab 10/01/21 0840 10/02/21 0506 10/03/21 0451  WBC 7.6 5.6 9.0  HGB 14.0 12.0 12.1  HCT 44.1 37.2 37.9  MCV 90.4 90.5 90.2  PLT 168 142* 163   Cardiac Enzymes: No results for input(s): CKTOTAL, CKMB, CKMBINDEX, TROPONINI in the last 168 hours. BNP: Invalid input(s): POCBNP CBG: No results for input(s): GLUCAP in the last 168 hours. D-Dimer No results for input(s): DDIMER in the last 72 hours. Hgb A1c No results for input(s): HGBA1C in the last 72 hours. Lipid Profile No results for input(s): CHOL, HDL, LDLCALC, TRIG, CHOLHDL, LDLDIRECT in the last 72 hours. Thyroid function studies No results for input(s): TSH, T4TOTAL, T3FREE, THYROIDAB in the last 72 hours.  Invalid input(s): FREET3 Anemia work up No results for input(s): VITAMINB12, FOLATE, FERRITIN, TIBC, IRON, RETICCTPCT in the  last 72 hours. Urinalysis    Component Value Date/Time   COLORURINE YELLOW 10/01/2021 1243   APPEARANCEUR CLEAR (A) 10/01/2021 1243   APPEARANCEUR Clear 09/04/2014 1811   LABSPEC <1.005 (L) 10/01/2021 1243   LABSPEC 1.019 09/04/2014 1811   PHURINE 6.0 10/01/2021 1243   GLUCOSEU NEGATIVE 10/01/2021 1243   GLUCOSEU Negative 09/04/2014 1811   HGBUR NEGATIVE 10/01/2021 1243   BILIRUBINUR NEGATIVE 10/01/2021 1243   BILIRUBINUR Negative 09/04/2014 1811   KETONESUR 15 (A) 10/01/2021 1243   PROTEINUR NEGATIVE 10/01/2021 1243   NITRITE NEGATIVE 10/01/2021 1243   LEUKOCYTESUR NEGATIVE 10/01/2021  1243   LEUKOCYTESUR Negative 09/04/2014 1811   Sepsis Labs Invalid input(s): PROCALCITONIN,  WBC,  LACTICIDVEN Microbiology Recent Results (from the past 240 hour(s))  Resp Panel by RT-PCR (Flu A&B, Covid) Nasopharyngeal Swab     Status: None   Collection Time: 10/01/21  8:40 AM   Specimen: Nasopharyngeal Swab; Nasopharyngeal(NP) swabs in vial transport medium  Result Value Ref Range Status   SARS Coronavirus 2 by RT PCR NEGATIVE NEGATIVE Final    Comment: (NOTE) SARS-CoV-2 target nucleic acids are NOT DETECTED.  The SARS-CoV-2 RNA is generally detectable in upper respiratory specimens during the acute phase of infection. The lowest concentration of SARS-CoV-2 viral copies this assay can detect is 138 copies/mL. A negative result does not preclude SARS-Cov-2 infection and should not be used as the sole basis for treatment or other patient management decisions. A negative result may occur with  improper specimen collection/handling, submission of specimen other than nasopharyngeal swab, presence of viral mutation(s) within the areas targeted by this assay, and inadequate number of viral copies(<138 copies/mL). A negative result must be combined with clinical observations, patient history, and epidemiological information. The expected result is Negative.  Fact Sheet for Patients:  EntrepreneurPulse.com.au  Fact Sheet for Healthcare Providers:  IncredibleEmployment.be  This test is no t yet approved or cleared by the [place] FDA and  has been authorized for detection and/or diagnosis of SARS-CoV-2 by FDA under an Emergency Use Authorization (EUA). This EUA will remain  in effect (meaning this test can be used) for the duration of the COVID-19 declaration under Section 564(b)(1) of the Act, 21 U.S.C.section 360bbb-3(b)(1), unless the authorization is terminated  or revoked sooner.       Influenza A by PCR NEGATIVE NEGATIVE Final    Influenza B by PCR NEGATIVE NEGATIVE Final    Comment: (NOTE) The Xpert Xpress SARS-CoV-2/FLU/RSV plus assay is intended as an aid in the diagnosis of influenza from Nasopharyngeal swab specimens and should not be used as a sole basis for treatment. Nasal washings and aspirates are unacceptable for Xpert Xpress SARS-CoV-2/FLU/RSV testing.  Fact Sheet for Patients: EntrepreneurPulse.com.au  Fact Sheet for Healthcare Providers: IncredibleEmployment.be  This test is not yet approved or cleared by the [place] FDA and has been authorized for detection and/or diagnosis of SARS-CoV-2 by FDA under an Emergency Use Authorization (EUA). This EUA will remain in effect (meaning this test can be used) for the duration of the COVID-19 declaration under Section 564(b)(1) of the Act, 21 U.S.C. section 360bbb-3(b)(1), unless the authorization is terminated or revoked.  Performed at [facility], [address] [place], [place] [address]      Total time spend on discharging this patient, including the last patient exam, discussing the hospital stay, instructions for ongoing care as it relates to all pertinent caregivers, as well as preparing the medical discharge records, prescriptions, and/or referrals as applicable, is 30  minutes.    Enzo Bi, MD  [facility] 10/03/2021, 9:19 AM

## 2021-10-03 NOTE — Progress Notes (Signed)
Pt being discharged home, discharge instructions reviewed with pt, states understanding, pt with no complaints, awaiting o2 delivery and transportation

## 2021-11-27 ENCOUNTER — Other Ambulatory Visit: Payer: Self-pay | Admitting: Primary Care

## 2021-11-27 DIAGNOSIS — J449 Chronic obstructive pulmonary disease, unspecified: Secondary | ICD-10-CM

## 2021-12-01 ENCOUNTER — Other Ambulatory Visit: Payer: Self-pay | Admitting: Primary Care

## 2021-12-01 DIAGNOSIS — J449 Chronic obstructive pulmonary disease, unspecified: Secondary | ICD-10-CM

## 2022-01-02 ENCOUNTER — Telehealth: Payer: Self-pay | Admitting: Internal Medicine

## 2022-01-02 MED ORDER — AZITHROMYCIN 250 MG PO TABS: ORAL_TABLET | ORAL | 0 refills | Status: AC

## 2022-01-02 MED ORDER — PREDNISONE 20 MG PO TABS: 20.0000 mg | ORAL_TABLET | Freq: Every day | ORAL | 0 refills | Status: DC

## 2022-01-02 NOTE — Telephone Encounter (Signed)
Patient is aware of results and voiced her understanding.  ?Zpak and prednisone has been sent to preferred pharmacy. ?Nothing further needed.  ? ?

## 2022-01-02 NOTE — Telephone Encounter (Signed)
Spoke to patient.  ?C/o prod cough with light green sputum and occ wheezing x3d. ?SOB is baseline.  ?Denied f/c/s or additional sx.  ?She has been taking OTC sinus medicine without relief.  ?She is using bevespi BID and albuterol HFA once daily  ?She would like zpak.  ? ?Dr. Mortimer Fries, please advise. Thanks ?

## 2022-02-10 ENCOUNTER — Telehealth: Payer: Self-pay | Admitting: Pulmonary Disease

## 2022-02-10 MED ORDER — AZITHROMYCIN 250 MG PO TABS: ORAL_TABLET | ORAL | 0 refills | Status: AC

## 2022-02-10 MED ORDER — PREDNISONE 20 MG PO TABS: 20.0000 mg | ORAL_TABLET | Freq: Every day | ORAL | 0 refills | Status: DC

## 2022-02-10 NOTE — Telephone Encounter (Addendum)
Zpak and prednisone sent to preferred pharmacy.  Patient is aware and voiced her understanding.  Nothing further needed.  

## 2022-02-10 NOTE — Telephone Encounter (Signed)
Spoke to patient. She is requesting zpak. C/o prod cough with yellow to green sputum, increased SOB with exertion and wheezing x3d Using breztri BID and albuterol BID.   Dr. Mortimer Fries, please advise. Thanks

## 2022-04-06 ENCOUNTER — Telehealth: Payer: Self-pay | Admitting: Internal Medicine

## 2022-04-06 MED ORDER — AZITHROMYCIN 250 MG PO TABS: ORAL_TABLET | ORAL | 0 refills | Status: AC

## 2022-04-06 NOTE — Telephone Encounter (Signed)
Okay for azithromycin Z-Pak to be called in.  If her symptoms worsen she will have to go to urgent care.

## 2022-04-06 NOTE — Telephone Encounter (Signed)
Spoke to patient.  She is requesting zpak C/o nasal drainage, prod cough with thick green sputum, wheezing and headache x2-3d.  SOB is baseline.  Negative home covid test 04/04/2022. Using ventolin BID and Bevespi BID. She wears 2L QHS. O2 maintaining around 96% on roomair.  Dr. Patsey Berthold, please advise. Dr. Mortimer Fries is unavailable.

## 2022-04-06 NOTE — Telephone Encounter (Signed)
Zpak has been sent to preferred pharmacy. Patient is aware and voiced her understanding.  Nothing further needed.

## 2022-05-06 ENCOUNTER — Other Ambulatory Visit: Payer: Self-pay | Admitting: Student

## 2022-05-06 DIAGNOSIS — Z1231 Encounter for screening mammogram for malignant neoplasm of breast: Secondary | ICD-10-CM

## 2022-05-25 ENCOUNTER — Telehealth: Payer: Self-pay | Admitting: Internal Medicine

## 2022-05-25 NOTE — Telephone Encounter (Signed)
She is requiring this frequently.  She needs to be seen at urgent care as she will likely need imaging.

## 2022-05-25 NOTE — Telephone Encounter (Signed)
Patient is aware of recommendations and voiced her understanding. Nothing further needed.   

## 2022-05-25 NOTE — Telephone Encounter (Signed)
Called and spoke to patient.  C/o prod cough with white to yellow sputum, wheezing, increased SOB with exertion Denied f/c/s or additional sx.  Neg covid test yesterday. 2L cont. Spo2 maintaining around 96%. Using Bevespi BID and albuterol HFA BID.  Dr. Patsey Berthold, please advise. Dr. Mortimer Fries is unavailable.

## 2022-05-25 NOTE — Telephone Encounter (Signed)
Patient states she is coughing, wheezing, has mucus that is thick white and a little yellow/green color to it which started Saturday. She has been using her inhaler and oxygen. Patient is requesting prednisone be sent to [facility] in [place].  Please advise.

## 2022-06-16 ENCOUNTER — Encounter: Payer: Self-pay | Admitting: Emergency Medicine

## 2022-06-16 ENCOUNTER — Other Ambulatory Visit: Payer: Self-pay

## 2022-06-16 ENCOUNTER — Emergency Department
Admission: EM | Admit: 2022-06-16 | Discharge: 2022-06-16 | Disposition: A | Discharge status: Home / Self Care | Payer: Medicare Other | Attending: Emergency Medicine | Admitting: Emergency Medicine

## 2022-06-16 ENCOUNTER — Ambulatory Visit
Admission: RE | Admit: 2022-06-16 | Discharge: 2022-06-16 | Disposition: A | Discharge status: Home / Self Care | Payer: Medicare Other | Source: Ambulatory Visit | Attending: Student | Admitting: Student

## 2022-06-16 ENCOUNTER — Emergency Department: Payer: Medicare Other

## 2022-06-16 DIAGNOSIS — J329 Chronic sinusitis, unspecified: Secondary | ICD-10-CM | POA: Insufficient documentation

## 2022-06-16 DIAGNOSIS — Z1231 Encounter for screening mammogram for malignant neoplasm of breast: Secondary | ICD-10-CM | POA: Insufficient documentation

## 2022-06-16 DIAGNOSIS — Z20822 Contact with and (suspected) exposure to covid-19: Secondary | ICD-10-CM | POA: Diagnosis not present

## 2022-06-16 DIAGNOSIS — J019 Acute sinusitis, unspecified: Secondary | ICD-10-CM

## 2022-06-16 DIAGNOSIS — J449 Chronic obstructive pulmonary disease, unspecified: Secondary | ICD-10-CM | POA: Insufficient documentation

## 2022-06-16 DIAGNOSIS — R0602 Shortness of breath: Secondary | ICD-10-CM | POA: Diagnosis present

## 2022-06-16 DIAGNOSIS — J441 Chronic obstructive pulmonary disease with (acute) exacerbation: Secondary | ICD-10-CM

## 2022-06-16 LAB — BASIC METABOLIC PANEL
Anion gap: 8 (ref 5–15)
BUN: 14 mg/dL (ref 8–23)
CO2: 26 mmol/L (ref 22–32)
Calcium: 8.9 mg/dL (ref 8.9–10.3)
Chloride: 108 mmol/L (ref 98–111)
Creatinine, Ser: 0.62 mg/dL (ref 0.44–1.00)
GFR, Estimated: >60 mL/min (ref >60)
Glucose, Bld: 104 mg/dL — ABNORMAL HIGH (ref 70–99)
Potassium: 4 mmol/L (ref 3.5–5.1)
Sodium: 142 mmol/L (ref 135–145)

## 2022-06-16 LAB — CBC WITH DIFFERENTIAL/PLATELET
Abs Immature Granulocytes: 0.04 10*3/uL (ref 0.00–0.07)
Basophils Absolute: 0 10*3/uL (ref 0.0–0.1)
Basophils Relative: 1 %
Eosinophils Absolute: 0.1 10*3/uL (ref 0.0–0.5)
Eosinophils Relative: 2 %
HCT: 45.4 % (ref 36.0–46.0)
Hemoglobin: 14.3 g/dL (ref 12.0–15.0)
Immature Granulocytes: 1 %
Lymphocytes Relative: 24 %
Lymphs Abs: 1.5 10*3/uL (ref 0.7–4.0)
MCH: 28.5 pg (ref 26.0–34.0)
MCHC: 31.5 g/dL (ref 30.0–36.0)
MCV: 90.6 fL (ref 80.0–100.0)
Monocytes Absolute: 0.5 10*3/uL (ref 0.1–1.0)
Monocytes Relative: 8 %
Neutro Abs: 4 10*3/uL (ref 1.7–7.7)
Neutrophils Relative %: 64 %
Platelets: 134 10*3/uL — ABNORMAL LOW (ref 150–400)
RBC: 5.01 MIL/uL (ref 3.87–5.11)
RDW: 12.6 % (ref 11.5–15.5)
Smear Review: NORMAL
WBC: 6.2 10*3/uL (ref 4.0–10.5)
nRBC: 0 % (ref 0.0–0.2)

## 2022-06-16 LAB — RESP PANEL BY RT-PCR (FLU A&B, COVID) ARPGX2
Influenza A by PCR: NEGATIVE
Influenza B by PCR: NEGATIVE
SARS Coronavirus 2 by RT PCR: NEGATIVE

## 2022-06-16 LAB — TROPONIN I (HIGH SENSITIVITY)
Troponin I (High Sensitivity): 55 ng/L — ABNORMAL HIGH (ref <18)
Troponin I (High Sensitivity): 46 ng/L — ABNORMAL HIGH (ref <18)

## 2022-06-16 MED ORDER — AZITHROMYCIN 250 MG PO TABS: ORAL_TABLET | ORAL | 0 refills | Status: AC

## 2022-06-16 MED ORDER — PREDNISONE 10 MG PO TABS: 10.0000 mg | ORAL_TABLET | Freq: Every day | ORAL | 0 refills | Status: DC

## 2022-06-16 MED ORDER — SODIUM CHLORIDE 0.9 % IV BOLUS
1000.0000 mL | Freq: Once | INTRAVENOUS | Status: AC
Start: 2022-06-16 — End: 2022-06-16
  Administered 2022-06-16: 1000 mL via INTRAVENOUS

## 2022-06-16 MED ORDER — LORAZEPAM 2 MG/ML IJ SOLN
0.5000 mg | Freq: Once | INTRAMUSCULAR | Status: AC
  Administered 2022-06-16: 0.5 mg via INTRAVENOUS
  Filled 2022-06-16 (×2): qty 1

## 2022-06-16 MED ORDER — ONDANSETRON HCL 4 MG/2ML IJ SOLN
4.0000 mg | Freq: Once | INTRAMUSCULAR | Status: AC
  Administered 2022-06-16: 4 mg via INTRAVENOUS
  Filled 2022-06-16 (×2): qty 2

## 2022-06-16 NOTE — ED Notes (Signed)
Called lab. Trop not run with CBC when blood work collected. Lab requested re-draw.

## 2022-06-16 NOTE — ED Triage Notes (Signed)
Pt here with SOB. Pt has COPD and was recently dx with a URI. Pt wears 2L oxygen chronically. Pt denies swelling.

## 2022-06-16 NOTE — ED Provider Notes (Signed)
[facility] Provider Note    Event Date/Time   First MD Initiated Contact with Patient 06/16/22 1608     (approximate)  History   Chief Complaint: Shortness of Breath  HPI  Kelli Phillips is a 71 y.o. female with a past medical history of COPD presents emergency department with complaints of sinus/nasal congestion and shortness of breath.  According to the patient for the past 1.5 weeks she has had significant congestion and she states pressure in her sinuses.  She has been experiencing rhinorrhea and some mild shortness of breath at times although states not significant.  Patient has baseline COPD wears 2 L of oxygen chronically currently on room air satting 95%.  Patient denies any chest pain.  Denies any abdominal pain.  Denies any leg pain or swelling.  Physical Exam   Triage Vital Signs: ED Triage Vitals  Enc Vitals Group     BP 06/16/22 1115 (!) 156/89     Pulse Rate 06/16/22 1115 (!) 109     Resp 06/16/22 1115 20     Temp 06/16/22 1115 97.9 F (36.6 C)     Temp Source 06/16/22 1115 Oral     SpO2 06/16/22 1115 92 %     Weight --      Height --      Head Circumference --      Peak Flow --      Pain Score 06/16/22 1119 3     Pain Loc --      Pain Edu? --      Excl. in [place]? --     Most recent vital signs: Vitals:   06/16/22 1115 06/16/22 1629  BP: (!) 156/89 (!) 157/94  Pulse: (!) 109 99  Resp: 20 18  Temp: 97.9 F (36.6 C) 97.9 F (36.6 C)  SpO2: 92% 95%    General: Awake, no distress.  CV:  Good peripheral perfusion.  Regular rate and rhythm  Resp:  Normal effort.  Equal breath sounds bilaterally.  Slight expiratory wheeze. Abd:  No distention.  Soft, nontender.  No rebound or guarding. Other:  Mild ethmoid sinus tenderness to palpation.   ED Results / Procedures / Treatments   EKG  EKG viewed and interpreted by myself shows a normal sinus rhythm 84 bpm with a narrow QRS, normal axis, normal intervals, nonspecific ST changes  without ST elevation.  RADIOLOGY  I have reviewed and interpreted the chest x-ray images I do not see any obvious consolidation on my evaluation. Radiology is read the x-ray is negative  MEDICATIONS ORDERED IN ED: Medications - No data to display   IMPRESSION / MDM / [place] / ED COURSE  I reviewed the triage vital signs and the nursing notes.  Patient's presentation is most consistent with acute presentation with potential threat to life or bodily function.  Patient presents emergency department for cough congestion sinus congestion and pressure as well as mild shortness of breath.  Overall the patient appears well, no distress denies any chest pain.  Denies any increased shortness of breath over baseline does state an occasional cough as well as her main concern being sinus pain and pressure as well as some nasal congestion.  Patient's work-up is overall reassuring.  CBC is normal, chest x-ray is clear.  Chemistry and troponin have hemolyzed but we have resent them.  Patient's symptoms are very suggestive of sinusitis with possible mild COPD exacerbation.  As long as the patient's lab work is  reassuring anticipate likely discharge home with prednisone and Zithromax to cover for sinusitis as well as possible COPD exacerbation.  Patient agreeable to plan of care.  Patient's chemistry has resulted largely within normal limits.  Troponin slightly elevated though second troponin is slightly decreased.  Discussed with the patient prednisone taper Zithromax and have the patient follow-up with her doctor.  Patient is agreeable to plan of care.  After fluids patient states she is feeling much better.  FINAL CLINICAL IMPRESSION(S) / ED DIAGNOSES   Sinusitis COPD  Rx / DC Orders   Zithromax Prednisone  Note:  This document was prepared using Dragon voice recognition software and may include unintentional dictation errors.   Harvest Dark, MD 06/16/22 2243

## 2022-06-16 NOTE — ED Notes (Signed)
Lab called to add repeat troponin. Gabby in lab verifies to be ran

## 2022-06-16 NOTE — ED Notes (Addendum)
RN first encounter prior to discharge. Pt verbalized understanding of discharge instructions, prescriptions, and follow-up care instructions. Pt advised if symptoms worsen to return to ED. Pt ambulatory with steady gait at discharge.

## 2022-06-16 NOTE — ED Provider Triage Note (Signed)
Emergency Medicine Provider Triage Evaluation Note  Kelli Phillips , a 71 y.o. female  was evaluated in triage.  Pt complains of SOB. Reports history of COPD. Reports had sore throat and runny nose last week, took covid test which was negative. No chest pain. No leg swelling. No history of PE/DVT.   Review of Systems  Positive: SOB, runny nose Negative: Fever, cp  Physical Exam  BP (!) 156/89 (BP Location: Right Arm)   Pulse (!) 109   Temp 97.9 F (36.6 C) (Oral)   Resp 20   SpO2 92%  Gen:   Awake, no distress   Resp:  Normal effort, speaking in complete sentences  MSK:   Moves extremities without difficulty  Other:    Medical Decision Making  Medically screening exam initiated at 11:17 AM.  Appropriate orders placed.  Kelli Phillips was informed that the remainder of the evaluation will be completed by another provider, this initial triage assessment does not replace that evaluation, and the importance of remaining in the ED until their evaluation is complete.     Marquette Old, PA-C 06/16/22 1120

## 2022-06-26 ENCOUNTER — Telehealth: Payer: Self-pay | Admitting: Internal Medicine

## 2022-06-26 NOTE — Telephone Encounter (Signed)
Anita, please advise. Thanks 

## 2022-06-26 NOTE — Telephone Encounter (Signed)
Do either Langley Gauss want to talk with the patient

## 2022-06-26 NOTE — Telephone Encounter (Signed)
Left message for pt to call back to reschedule lung screening CT.

## 2022-06-26 NOTE — Telephone Encounter (Signed)
No Ct orders has been placed. She did have [facility] Ct back 08/2021 and [facility] CT for 08/2022 has already been scheduled

## 2022-06-26 NOTE — Telephone Encounter (Signed)
Patient is wanting to reschedule CT that is scheduled for 08/2022, as her sister is currently sick.  She will call back to rescheduled.

## 2022-06-27 ENCOUNTER — Other Ambulatory Visit: Payer: Self-pay | Admitting: Internal Medicine

## 2022-06-27 DIAGNOSIS — J449 Chronic obstructive pulmonary disease, unspecified: Secondary | ICD-10-CM

## 2022-07-01 ENCOUNTER — Telehealth: Payer: Self-pay | Admitting: Internal Medicine

## 2022-07-01 DIAGNOSIS — J449 Chronic obstructive pulmonary disease, unspecified: Secondary | ICD-10-CM

## 2022-07-01 MED ORDER — BEVESPI AEROSPHERE 9-4.8 MCG/ACT IN AERO: INHALATION_SPRAY | RESPIRATORY_TRACT | 6 refills | Status: DC

## 2022-07-01 NOTE — Telephone Encounter (Signed)
I have refilled the prescription and notified the patient. Nothing further is needed.

## 2022-07-08 NOTE — Telephone Encounter (Signed)
Left another message for pt to call back if she still needs to reschedule lung screening ct appt.

## 2022-07-21 ENCOUNTER — Ambulatory Visit: Payer: Medicare Other | Admitting: Internal Medicine

## 2022-07-31 ENCOUNTER — Ambulatory Visit: Payer: Medicare Other | Admitting: Internal Medicine

## 2022-08-11 ENCOUNTER — Telehealth: Payer: Self-pay | Admitting: Internal Medicine

## 2022-08-11 MED ORDER — AZITHROMYCIN 250 MG PO TABS: ORAL_TABLET | ORAL | 0 refills | Status: AC

## 2022-08-11 MED ORDER — PREDNISONE 20 MG PO TABS: 20.0000 mg | ORAL_TABLET | Freq: Every day | ORAL | 0 refills | Status: DC

## 2022-08-11 NOTE — Telephone Encounter (Signed)
Called patient. She feels that she has developed a COPD flare.  C/o prod cough with yellow sputum, bilateral back discomfort under bra strap, increased SOB with exertion, some wheezing and chills x3d. Denied fever, sweats or additional sx. 2L cont. Spo2 maintaining in high 90's. Using duoneb BID, bevespi BID and ventolin PRN.  No recent covid, flu or RSV test.   Dr. Mortimer Fries, please advise.

## 2022-08-11 NOTE — Telephone Encounter (Signed)
Zpak and prednisone sent to preferred pharmacy.  Patient is aware and voiced her understanding.  Nothing further needed.

## 2022-08-17 ENCOUNTER — Ambulatory Visit (INDEPENDENT_AMBULATORY_CARE_PROVIDER_SITE_OTHER): Payer: Medicare Other | Admitting: Internal Medicine

## 2022-08-17 ENCOUNTER — Encounter: Payer: Self-pay | Admitting: Internal Medicine

## 2022-08-17 VITALS — BP 138/80 | HR 84 | Temp 97.5°F | Ht 64.0 in | Wt 132.2 lb

## 2022-08-17 DIAGNOSIS — I2510 Atherosclerotic heart disease of native coronary artery without angina pectoris: Secondary | ICD-10-CM

## 2022-08-17 DIAGNOSIS — J449 Chronic obstructive pulmonary disease, unspecified: Secondary | ICD-10-CM
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
## 2022-08-17 NOTE — Patient Instructions (Signed)
Continue prednisone and Z-Pak as previously prescribed  Continue inhalers as prescribed  Avoid secondhand smoke Avoid SICK contacts Recommend  Masking  when appropriate Recommend Keep up-to-date with vaccinations   Will place referral for cardiology to assess for CAD Referral to lung cancer screening program already enrolled

## 2022-08-19 ENCOUNTER — Other Ambulatory Visit: Payer: Self-pay | Admitting: Internal Medicine

## 2022-08-19 DIAGNOSIS — J449 Chronic obstructive pulmonary disease, unspecified: Secondary | ICD-10-CM

## 2022-09-10 ENCOUNTER — Emergency Department: Payer: Medicare Other

## 2022-09-10 ENCOUNTER — Emergency Department
Admission: EM | Admit: 2022-09-10 | Discharge: 2022-09-10 | Disposition: A | Discharge status: Home / Self Care | Payer: Medicare Other | Attending: Emergency Medicine | Admitting: Emergency Medicine

## 2022-09-10 DIAGNOSIS — J449 Chronic obstructive pulmonary disease, unspecified: Secondary | ICD-10-CM | POA: Diagnosis not present

## 2022-09-10 DIAGNOSIS — I509 Heart failure, unspecified: Secondary | ICD-10-CM | POA: Diagnosis not present

## 2022-09-10 DIAGNOSIS — R7989 Other specified abnormal findings of blood chemistry: Secondary | ICD-10-CM | POA: Diagnosis not present

## 2022-09-10 DIAGNOSIS — R600 Localized edema: Secondary | ICD-10-CM | POA: Insufficient documentation

## 2022-09-10 DIAGNOSIS — M7989 Other specified soft tissue disorders: Secondary | ICD-10-CM | POA: Diagnosis present

## 2022-09-10 LAB — CBC WITH DIFFERENTIAL/PLATELET
Abs Immature Granulocytes: 0.02 10*3/uL (ref 0.00–0.07)
Basophils Absolute: 0 10*3/uL (ref 0.0–0.1)
Basophils Relative: 1 %
Eosinophils Absolute: 0.1 10*3/uL (ref 0.0–0.5)
Eosinophils Relative: 3 %
HCT: 44.1 % (ref 36.0–46.0)
Hemoglobin: 14.1 g/dL (ref 12.0–15.0)
Immature Granulocytes: 0 %
Lymphocytes Relative: 31 %
Lymphs Abs: 1.5 10*3/uL (ref 0.7–4.0)
MCH: 28.7 pg (ref 26.0–34.0)
MCHC: 32 g/dL (ref 30.0–36.0)
MCV: 89.8 fL (ref 80.0–100.0)
Monocytes Absolute: 0.3 10*3/uL (ref 0.1–1.0)
Monocytes Relative: 7 %
Neutro Abs: 2.8 10*3/uL (ref 1.7–7.7)
Neutrophils Relative %: 58 %
Platelets: 178 10*3/uL (ref 150–400)
RBC: 4.91 MIL/uL (ref 3.87–5.11)
RDW: 12.8 % (ref 11.5–15.5)
WBC: 4.8 10*3/uL (ref 4.0–10.5)
nRBC: 0 % (ref 0.0–0.2)

## 2022-09-10 LAB — COMPREHENSIVE METABOLIC PANEL
ALT: 10 U/L (ref 0–44)
AST: 25 U/L (ref 15–41)
Albumin: 3.7 g/dL (ref 3.5–5.0)
Alkaline Phosphatase: 70 U/L (ref 38–126)
Anion gap: 8 (ref 5–15)
BUN: 12 mg/dL (ref 8–23)
CO2: 26 mmol/L (ref 22–32)
Calcium: 8.8 mg/dL — ABNORMAL LOW (ref 8.9–10.3)
Chloride: 109 mmol/L (ref 98–111)
Creatinine, Ser: 0.65 mg/dL (ref 0.44–1.00)
GFR, Estimated: >60 mL/min (ref >60)
Glucose, Bld: 90 mg/dL (ref 70–99)
Potassium: 4.3 mmol/L (ref 3.5–5.1)
Sodium: 143 mmol/L (ref 135–145)
Total Bilirubin: 1.6 mg/dL — ABNORMAL HIGH (ref 0.3–1.2)
Total Protein: 6.2 g/dL — ABNORMAL LOW (ref 6.5–8.1)

## 2022-09-10 LAB — D-DIMER, QUANTITATIVE: D-Dimer, Quant: 0.33 ug/mL-FEU (ref 0.00–0.50)

## 2022-09-10 LAB — TROPONIN I (HIGH SENSITIVITY)
Troponin I (High Sensitivity): 4 ng/L (ref <18)
Troponin I (High Sensitivity): 5 ng/L (ref <18)

## 2022-09-10 LAB — BRAIN NATRIURETIC PEPTIDE: B Natriuretic Peptide: 90.2 pg/mL (ref 0.0–100.0)

## 2022-09-10 MED ORDER — FUROSEMIDE 40 MG PO TABS: 40.0000 mg | ORAL_TABLET | Freq: Every day | ORAL | 11 refills | Status: DC
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
## 2022-09-10 NOTE — Discharge Instructions (Addendum)
-  I suspect that your leg swelling and the fluid in your lungs is likely related to early stages of heart failure.  The heart failure clinic should be reaching out to you to schedule an appointment.  If they do not, you may utilize the contact information on this page to schedule an appointment.  -Please take the furosemide daily as prescribed.  This will help offload some of the fluid in your body.  -Return to the emergency department anytime if you begin to experience any new or worsening symptoms.

## 2022-09-10 NOTE — ED Provider Notes (Signed)
[facility] Provider Note    Event Date/Time   First MD Initiated Contact with Patient 09/10/22 1150     (approximate)   History   Chief Complaint Leg Swelling   HPI Kelli Phillips is a 72 y.o. female, history of COPD, presents to the emergency department for evaluation of leg swelling.  Patient states that she noticed that both of her feet/lower legs were swollen when she was in the shower washing her feet.  She does state that she cut her right foot on something a few days ago, but is unsure if this is related.  She does endorse some mild shortness of breath as well.  She is normally on 2 L of oxygen at home for her COPD.  She says at 1 point, she was told by a doctor that she may have heart failure and does have an appointment scheduled with cardiology next month.  Denies fever/chills, chest pain, abdominal pain, flank pain, nausea/vomiting, diarrhea, urinary symptoms, headache, weakness, vision change, hearing change, restless lesions, paresthesias, or dizziness/lightheadedness.  History Limitations: No limitations.        Physical Exam  Triage Vital Signs: ED Triage Vitals [09/10/22 1134]  Enc Vitals Group     BP (!) 141/72     Pulse Rate 75     Resp 18     Temp 98.4 F (36.9 C)     Temp Source Oral     SpO2 93 %     Weight 130 lb (59 kg)     Height      Head Circumference      Peak Flow      Pain Score      Pain Loc      Pain Edu?      Excl. in [place]?     Most recent vital signs: Vitals:   09/10/22 1530 09/10/22 1603  BP: (!) 146/82 (!) 146/82  Pulse: 84 84  Resp:  18  Temp:  98.1 F (36.7 C)  SpO2: 92% 94%    General: Awake, NAD.  Skin: Warm, dry. No rashes or lesions.  Eyes: PERRL. Conjunctivae normal.  CV: Good peripheral perfusion.  Resp: Normal effort.  Lung sounds are clear bilaterally. Abd: Soft, non-tender. No distention.  Neuro: At baseline. No gross neurological deficits.  Musculoskeletal: Normal ROM of all  extremities.  Focused Exam: 2+ pitting edema in the lower extremities bilaterally.  No warmth, erythema, or tenderness.  She does have a superficial healing laceration along the dorsum of her right foot.  No active bleeding or discharge.  PMS intact distally in both lower extremities.  Physical Exam    ED Results / Procedures / Treatments  Labs (all labs ordered are listed, but only abnormal results are displayed) Labs Reviewed  COMPREHENSIVE METABOLIC PANEL - Abnormal; Notable for the following components:      Result Value   Calcium 8.8 (*)    Total Protein 6.2 (*)    Total Bilirubin 1.6 (*)    All other components within normal limits  CBC WITH DIFFERENTIAL/PLATELET  BRAIN NATRIURETIC PEPTIDE  D-DIMER, QUANTITATIVE (NOT AT [facility])  TROPONIN I (HIGH SENSITIVITY)  TROPONIN I (HIGH SENSITIVITY)     EKG Sinus rhythm, rate of 73, inverted P wave suggestive of possible junctional rhythm, no significant ST segment changes, normal QRS, no QT prolongation, no significant axis deviations.    RADIOLOGY  ED Provider Interpretation: I personally reviewed and interpreted these images.  Bilateral DVT ultrasounds negative.  Chest x-ray shows mild pulmonary edema.  US Venous Img Lower Bilateral  Result Date: 09/10/2022 CLINICAL DATA:  leg swelling, shortness of breath EXAM: BILATERAL LOWER EXTREMITY VENOUS DOPPLER ULTRASOUND TECHNIQUE: Gray-scale sonography with compression, as well as color and duplex ultrasound, were performed to evaluate the deep venous system(s) from the level of the common femoral vein through the popliteal and proximal calf veins. COMPARISON:  December 08, 2019. FINDINGS: VENOUS Normal compressibility of the common femoral, superficial femoral, and popliteal veins, as well as the visualized calf veins. Visualized portions of profunda femoral vein and great saphenous vein unremarkable. No filling defects to suggest DVT on grayscale or color Doppler imaging. Doppler waveforms  show normal direction of venous flow, normal respiratory plasticity and response to augmentation. OTHER None. Limitations: none IMPRESSION: Negative. Electronically Signed   By: Valentino Saxon M.D.   On: 09/10/2022 13:29   DG Chest 1 View  Result Date: 09/10/2022 CLINICAL DATA:  Leg swelling, shortness of breath. History of COPD and possible CHF. EXAM: CHEST  1 VIEW COMPARISON:  Chest radiograph 06/16/2022. FINDINGS: Hyperinflated lungs. Prominent interstitial markings, suggestive of mild pulmonary edema. Linear opacity in the right lung base favored to represent subsegmental atelectasis. Normal heart size and mediastinal contours. No pleural effusion or pneumothorax. Visualized bones and upper abdomen are unremarkable. IMPRESSION: 1. Mild pulmonary edema. 2. Hyperinflated lungs, consistent with history of COPD. Electronically Signed   By: Emmit Alexanders M.D   On: 09/10/2022 13:12    PROCEDURES:  Critical Care performed: N/A.  Procedures    MEDICATIONS ORDERED IN ED: Medications - No data to display   IMPRESSION / MDM / [place] / ED COURSE  I reviewed the triage vital signs and the nursing notes.                              Differential diagnosis includes, but is not limited to, COPD exacerbation, DVT, CHF, ACS, myocarditis/pericarditis, arrhythmia,  ED Course Patient appears well, vitals within normal limits.  NAD.  CMP shows no electrolyte abnormalities, transaminitis, or AKI.  D-dimer unremarkable at 0.33.  Initial troponin 4, second troponin 5.  BNP slightly elevated at 90.2.  Assessment/Plan Patient presents with bilateral lower extremity edema and mild shortness of breath.  She appears well clinically.  On physical exam, she does appear to have 2+ pitting edema in both lower extremities bilaterally.  No signs of infection.  Lab workup is overall unremarkable, though BNP is somewhat on the higher side at 90.2.  Troponins are reassuring.  No evidence of AKI.   Chest x-ray shows mild pulmonary edema.  Fortunately, her bilateral lower extremity ultrasounds were negative for DVTs.  I suspect that her peripheral edema and mild pulm edema is likely due to early stages of heart failure.  She is clinically stable at this time.  EKG shows junctional rhythm, but otherwise no evidence of ischemia.  Will provide her with a prescription for furosemide, as well as a referral to the heart failure clinic.  Advised her to follow-up with them, as well as her cardiologist within the next few weeks.  She was amenable to this.  Will discharge.  Considered admission for this patient, but given her stable presentation and close access to follow-up, she is unlikely benefit from admission.  Provided the patient with anticipatory guidance, return precautions, and educational material. Encouraged the patient to return to the emergency department at any time if they begin  to experience any new or worsening symptoms. Patient expressed understanding and agreed with the plan.   Patient's presentation is most consistent with acute complicated illness / injury requiring diagnostic workup.       FINAL CLINICAL IMPRESSION(S) / ED DIAGNOSES   Final diagnoses:  Bilateral lower extremity edema  Acute congestive heart failure, unspecified heart failure type ([place])     Rx / DC Orders   ED Discharge Orders          Ordered    AMB referral to CHF clinic        09/10/22 1641    furosemide (LASIX) 40 MG tablet  Daily        09/10/22 1641             Note:  This document was prepared using Dragon voice recognition software and may include unintentional dictation errors.   Teodoro Spray, [place] 09/10/22 1648    Blake Divine, MD 09/11/22 [phone]

## 2022-09-10 NOTE — ED Triage Notes (Addendum)
Pt arrived via EMS for bilatertal feet swelling. Pt sts that she noticed it when she was in the shower washing her feet after she cut her right foot.

## 2022-09-13 NOTE — Progress Notes (Deleted)
   Patient ID: Kelli Phillips, female    DOB: November 23, 1950, 72 y.o.   MRN: [id number]  HPI  Kelli Phillips is a 72 y/o female with a history of  No echo   Was in the ED 09/10/22 due to leg swelling. No DVT. Oral lasix started. Was in the ED 06/26/22 due to COPD exacerbation.   She presents today for her initial visit with a chief complaint of   Review of Systems    Physical Exam  Assessment & Plan:  1: Chronic heart failure with unknown ejection fraction, most likely preserved- - NYHA class - echo scheduled for  - to see cardiology Kelli Phillips) 10/07/22 - BNP 09/10/22 was 90.2  2: COPD- - chest CT done earlier today  - saw pulmonology (Kelli Phillips) 08/17/22 - BMP 09/10/22 showed sodium 143, potassium 4.3, creatinine 0.65 and GFR >60 - quit smoking 2009

## 2022-09-14 ENCOUNTER — Ambulatory Visit
Admission: RE | Admit: 2022-09-14 | Discharge: 2022-09-14 | Disposition: A | Discharge status: Home / Self Care | Payer: Medicare Other | Source: Ambulatory Visit | Attending: Acute Care | Admitting: Acute Care

## 2022-09-14 DIAGNOSIS — Z87891 Personal history of nicotine dependence: Secondary | ICD-10-CM | POA: Diagnosis present

## 2022-09-15 ENCOUNTER — Encounter: Payer: Medicare Other | Admitting: Family

## 2022-09-15 ENCOUNTER — Other Ambulatory Visit (HOSPITAL_COMMUNITY): Payer: Self-pay

## 2022-09-15 ENCOUNTER — Telehealth: Payer: Self-pay | Admitting: Family

## 2022-09-15 NOTE — Telephone Encounter (Signed)
Patient did not show for her initial Heart Failure Clinic appointment on 09/15/22. Will attempt to reschedule.

## 2022-09-16 ENCOUNTER — Other Ambulatory Visit: Payer: Self-pay

## 2022-09-16 DIAGNOSIS — Z87891 Personal history of nicotine dependence: Secondary | ICD-10-CM

## 2022-09-16 DIAGNOSIS — Z122 Encounter for screening for malignant neoplasm of respiratory organs: Secondary | ICD-10-CM

## 2022-09-23 ENCOUNTER — Ambulatory Visit (HOSPITAL_BASED_OUTPATIENT_CLINIC_OR_DEPARTMENT_OTHER): Payer: Medicare Other | Admitting: Family

## 2022-09-23 ENCOUNTER — Other Ambulatory Visit
Admission: RE | Admit: 2022-09-23 | Discharge: 2022-09-23 | Disposition: A | Discharge status: Home / Self Care | Payer: Medicare Other | Source: Ambulatory Visit | Attending: Family | Admitting: Family

## 2022-09-23 ENCOUNTER — Telehealth: Payer: Self-pay | Admitting: Family

## 2022-09-23 ENCOUNTER — Encounter: Payer: Self-pay | Admitting: Family

## 2022-09-23 VITALS — BP 115/68 | HR 81 | Resp 18 | Wt 123.0 lb

## 2022-09-23 DIAGNOSIS — J449 Chronic obstructive pulmonary disease, unspecified: Secondary | ICD-10-CM | POA: Diagnosis not present

## 2022-09-23 DIAGNOSIS — R0602 Shortness of breath: Secondary | ICD-10-CM

## 2022-09-23 LAB — BASIC METABOLIC PANEL
Anion gap: 9 (ref 5–15)
BUN: 18 mg/dL (ref 8–23)
CO2: 32 mmol/L (ref 22–32)
Calcium: 9.3 mg/dL (ref 8.9–10.3)
Chloride: 100 mmol/L (ref 98–111)
Creatinine, Ser: 0.76 mg/dL (ref 0.44–1.00)
GFR, Estimated: >60 mL/min (ref >60)
Glucose, Bld: 106 mg/dL — ABNORMAL HIGH (ref 70–99)
Potassium: 3.8 mmol/L (ref 3.5–5.1)
Sodium: 141 mmol/L (ref 135–145)

## 2022-09-23 MED ORDER — FUROSEMIDE 40 MG PO TABS: 20.0000 mg | ORAL_TABLET | Freq: Every day | ORAL | 11 refills | Status: DC

## 2022-09-23 NOTE — Telephone Encounter (Signed)
LM on BMP results obtained earlier today. Advised patient to decrease her furosemide from '40mg'$  daily to '20mg'$  daily with additional '20mg'$  as needed for weight gain, SOB or edema. To call back for any questions.

## 2022-09-23 NOTE — Progress Notes (Signed)
Patient ID: Kelli Phillips, female    DOB: 1950-10-26, 72 y.o.   MRN: [id number]  HPI  Kelli Phillips is a 72 y/o female with a history of COPD.  No echo   Was in the ED 09/10/22 due to leg swelling. No DVT. Oral lasix started. Was in the ED 06/26/22 due to COPD exacerbation.   She presents today for her initial visit with a chief complaint of minimal SOB with exertion. Describes this as chronic in nature. Has associated wheezing, anxiety and occasional palpitations along with this. Denies any difficulty sleeping, dizziness, abdominal distention, pedal edema, chest pain, cough or fatigue.   Was started on lasix during recent ED visit and says that her swelling has completely resolved. Is concerned about her heart because she has a family history of HF and heart disease.   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   Heart failure Maternal Grandmother    Social History   Tobacco Use   Smoking status: Former    Packs/day: 1.00    Years: 43.00    Total pack years: 43.00    Types: Cigarettes    Quit date: 2009    Years since quitting: 15.0   Smokeless tobacco: Never  Substance Use Topics   Alcohol use: No   Allergies  Allergen Reactions   Morphine And Related Other (See Comments)   Penicillins Rash   Sulfa Antibiotics Rash   Prior to Admission medications   Medication Sig Start Date End Date Taking? Authorizing Provider  albuterol (VENTOLIN HFA) 108 (90 Base) MCG/ACT inhaler TAKE 2 PUFFS BY MOUTH EVERY 6 HOURS AS NEEDED FOR WHEEZE OR SHORTNESS OF BREATH 08/19/22  Yes Kasa, Maretta Bees, MD  Glycopyrrolate-Formoterol (BEVESPI AEROSPHERE) 9-4.8 MCG/ACT AERO INHALE 2 PUFFS INTO THE LUNGS TWICE A  DAY 07/01/22  Yes Kasa, Maretta Bees, MD  furosemide (LASIX) 40 MG tablet Take 1 tablet (40 mg total) by mouth daily.  09/23/22   Darylene Price A, FNP  ipratropium-albuterol (DUONEB) 0.5-2.5 (3) MG/3ML SOLN Take 3 mLs by nebulization every 6 (six) hours as needed. Patient not taking: Reported on 08/17/2022 10/03/21   Enzo Bi, MD   Review of Systems  Constitutional:  Negative for appetite change and fatigue.  HENT:  Negative for congestion, postnasal drip and sore throat.   Eyes: Negative.   Respiratory:  Positive for shortness of breath and wheezing. Negative for cough and chest tightness.   Cardiovascular:  Positive for palpitations (at times). Negative for chest pain and leg swelling.  Gastrointestinal:  Negative for abdominal distention and abdominal pain.  Endocrine: Negative.   Genitourinary: Negative.   Musculoskeletal:  Negative for back pain and neck pain.  Skin: Negative.   Allergic/Immunologic: Negative.   Neurological:  Negative for dizziness and light-headedness.  Hematological:  Negative for adenopathy. Does not bruise/bleed easily.  Psychiatric/Behavioral:  Negative for dysphoric mood and sleep disturbance (sleeping on 2-3 pillows; oxygen PRN at night). The patient is nervous/anxious.    Vitals:   09/23/22 1417  BP: 115/68  Pulse: 81  Resp: 18  SpO2: 97%  Weight: 123 lb (  55.8 kg)   Wt Readings from Last 3 Encounters:  09/23/22 123 lb (55.8 kg)  09/10/22 130 lb (59 kg)  08/17/22 132 lb 3.2 oz (60 kg)   Lab Results  Component Value Date   CREATININE 0.76 09/23/2022   CREATININE 0.65 09/10/2022   CREATININE 0.62 06/16/2022   Physical Exam Vitals and nursing note reviewed.  Constitutional:      Appearance: She is well-developed.  HENT:     Head: Normocephalic and atraumatic.  Neck:     Vascular: No JVD.  Cardiovascular:     Rate and Rhythm: Normal rate and regular rhythm.  Pulmonary:     Effort: Pulmonary effort is normal.     Breath sounds: No wheezing, rhonchi  or rales.  Abdominal:     Palpations: Abdomen is soft.     Tenderness: There is no abdominal tenderness.  Musculoskeletal:     Cervical back: Normal range of motion and neck supple.     Right lower leg: No tenderness. No edema.     Left lower leg: No tenderness. No edema.  Skin:    General: Skin is warm and dry.  Neurological:     Mental Status: She is alert and oriented to person, place, and time.  Psychiatric:        Mood and Affect: Mood normal.        Behavior: Behavior normal.   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- - NYHA class II - euvolemic - to start weighing daily and call for an overnight weight gain of > 2 pounds or a weekly weight gain of > 5 pounds - not adding salt except on rare occasions - reviewed what heart failure is; she says family members have had HF, CAD - echo scheduled for 10/23/22 - any GDMT pending echo results - to see cardiology [name]) 10/07/22 - will check BMP today since furosemide was recently started - BNP 09/10/22 was 90.2 - PharmD reconciled meds w/ patient - does not get flu vaccine  2: COPD- - chest CT done 09/14/22  - saw pulmonology (Kasa) 08/17/22 - oxygen at 2L at bedtime & PRN during the day - BMP 09/10/22 showed sodium 143, potassium 4.3, creatinine 0.65 and GFR >60 - quit smoking 2009   Medication list reviewed.   Return in 1 month, sooner if needed.

## 2022-09-23 NOTE — Patient Instructions (Addendum)
Continue weighing daily and call for an overnight weight gain of 3 pounds or more or a weekly weight gain of more than 5 pounds.   If you have voicemail, please make sure your mailbox is cleaned out so that we may leave a message and please make sure to listen to any voicemails.    Go to the [facility] to registration desk and get your lab work drawn.

## 2022-10-07 ENCOUNTER — Encounter: Payer: Self-pay | Admitting: Cardiovascular Disease

## 2022-10-07 ENCOUNTER — Ambulatory Visit: Payer: Medicare Other | Attending: Cardiovascular Disease | Admitting: Cardiovascular Disease

## 2022-10-07 VITALS — BP 120/64 | HR 71 | Ht 64.0 in | Wt 126.1 lb

## 2022-10-07 DIAGNOSIS — J449 Chronic obstructive pulmonary disease, unspecified: Secondary | ICD-10-CM | POA: Insufficient documentation

## 2022-10-07 DIAGNOSIS — J9601 Acute respiratory failure with hypoxia: Secondary | ICD-10-CM | POA: Diagnosis present

## 2022-10-07 DIAGNOSIS — I2510 Atherosclerotic heart disease of native coronary artery without angina pectoris: Secondary | ICD-10-CM | POA: Insufficient documentation

## 2022-10-07 DIAGNOSIS — I700 Atherosclerosis of aorta: Secondary | ICD-10-CM | POA: Insufficient documentation

## 2022-10-07 DIAGNOSIS — I2584 Coronary atherosclerosis due to calcified coronary lesion: Secondary | ICD-10-CM | POA: Insufficient documentation

## 2022-10-07 NOTE — Patient Instructions (Addendum)
Medication Instructions:  No changes  If you need a refill on your cardiac medications before your next appointment, please call your pharmacy.   Lab work: No new labs needed  Testing/Procedures: No new testing needed  Follow-Up: At [facility], you and your health needs are our priority.  As part of our continuing mission to provide you with exceptional heart care, we have created designated Provider Care Teams.  These Care Teams include your primary Cardiologist (physician) and Advanced Practice Providers (APPs -  Physician Assistants and Nurse Practitioners) who all work together to provide you with the care you need, when you need it.  You will need a follow up appointment as needed  Providers on your designated Care Team:   Christopher Berge, NP Ryan Dunn, PA-C Cadence Furth, PA-C  COVID-19 Vaccine Information can be found at: [email or web address] For questions related to vaccine distribution or appointments, please email [email or web address] or call [phone].    

## 2022-10-07 NOTE — Progress Notes (Signed)
Cardiology Office Note  Date:  10/07/2022   ID:  Kelli Phillips, DOB 1951/05/05, MRN [id number]  PCP:  System, Provider Not In   Chief Complaint  Patient presents with   New Patient (Initial Visit)    Ref by Dr. Mortimer Fries for COPD/CAD. Patient c/o shortness of breath with exertion. Medications reviewed by the patient verbally.     HPI:  [name] is a 72 year old woman with past medical history of Smoking, quit 30 yrs ago COPD followed by pulmonary CT scan chest January 2024 showing coronary artery calcification Aortic atherosclerosis Who presents by referral from Dr. Mortimer Fries for consultation of her cardiac risk factors, cardiac disease, shortness of breath  On discussion today, reports she is feeling well, denies any significant chest pain concerning for angina Stop smoking many years ago, on inhalers for shortness of breath/COPD  Was in the ED 06/26/22 due to COPD exacerbation.   Recent incident where she hit her foot with a shovel, reports developing leg swelling after the trauma Seen in the ED 09/10/22 due to leg swelling. No DVT. Oral lasix started.  Wonders whether she needs to stay on Lasix 20 daily indefinitely  Echocardiogram ordered but has not completed  Walks 2-3 miles, able to walk without having to stop Uses inhalers as needed  Family hx Mother with CHF Brother with CAD Sister with MI  EKG personally reviewed by myself on todays visit Normal sinus rhythm rate 71 bpm no significant ST-T wave changes  PMH:   has a past medical history of Cancer ([place]) and COPD (chronic obstructive pulmonary disease) ([place]).  PSH:    Past Surgical History:  Procedure Laterality Date   CESAREAN SECTION     COLON SURGERY      Current Outpatient Medications  Medication Sig Dispense Refill   albuterol (VENTOLIN HFA) 108 (90 Base) MCG/ACT inhaler TAKE 2 PUFFS BY MOUTH EVERY 6 HOURS AS NEEDED FOR WHEEZE OR SHORTNESS OF BREATH 18 each 1   furosemide (LASIX) 40 MG tablet Take 0.5  tablets (20 mg total) by mouth daily. And additional '20mg'$  as needed 30 tablet 11   Glycopyrrolate-Formoterol (BEVESPI AEROSPHERE) 9-4.8 MCG/ACT AERO INHALE 2 PUFFS INTO THE LUNGS TWICE A DAY 10.7 each 6   ipratropium-albuterol (DUONEB) 0.5-2.5 (3) MG/3ML SOLN Take 3 mLs by nebulization every 6 (six) hours as needed. (Patient not taking: Reported on 10/07/2022) 120 mL 2   No current facility-administered medications for this visit.     Allergies:   Morphine and related, Penicillins, and Sulfa antibiotics   Social History:  The patient  reports that she quit smoking about 15 years ago. Her smoking use included cigarettes. She has a 43.00 pack-year smoking history. She has never used smokeless tobacco. She reports that she does not drink alcohol and does not use drugs.   Family History:   family history includes Breast cancer in her paternal aunt; Colon cancer in her father; Heart attack (age of onset: 67) in her brother; Heart failure in her maternal grandmother, mother, and sister; Lung cancer in her brother.    Review of Systems: Review of Systems  Constitutional: Negative.   HENT: Negative.    Respiratory: Negative.    Cardiovascular: Negative.   Gastrointestinal: Negative.   Musculoskeletal: Negative.   Neurological: Negative.   Psychiatric/Behavioral: Negative.    All other systems reviewed and are negative.    PHYSICAL EXAM: VS:  BP 120/64 (BP Location: Right Arm, Patient Position: Sitting, Cuff Size: Normal)   Pulse 71  Ht '5\' 4"'$  (1.626 m)   Wt 126 lb 2 oz (57.2 kg)   SpO2 96%   BMI 21.65 kg/m  , BMI Body mass index is 21.65 kg/m. GEN: Well nourished, well developed, in no acute distress HEENT: normal Neck: no JVD, carotid bruits, or masses Cardiac: RRR; no murmurs, rubs, or gallops,no edema  Respiratory:  clear to auscultation bilaterally, normal work of breathing GI: soft, nontender, nondistended, + BS MS: no deformity or atrophy Skin: warm and dry, no rash Neuro:   Strength and sensation are intact Psych: euthymic mood, full affect   Recent Labs: 09/10/2022: ALT 10; B Natriuretic Peptide 90.2; Hemoglobin 14.1; Platelets 178 09/23/2022: BUN 18; Creatinine, Ser 0.76; Potassium 3.8; Sodium 141    Lipid Panel Lab Results  Component Value Date   CHOL 107 04/07/2014   TRIG 65 04/07/2014      Wt Readings from Last 3 Encounters:  10/07/22 126 lb 2 oz (57.2 kg)  09/23/22 123 lb (55.8 kg)  09/10/22 130 lb (59 kg)      ASSESSMENT AND PLAN:  Problem List Items Addressed This Visit       Cardiology Problems   Aortic atherosclerosis ([place])     Other   Acute hypoxemic respiratory failure (HCC)   Other Visit Diagnoses     Coronary artery calcification    -  Primary   Relevant Orders   EKG 12-Lead   Chronic obstructive pulmonary disease, unspecified COPD type (HCC)          COPD/shortness of breath Stop smoking many years ago, managed by pulmonary Uses inhalers, reports having reasonable exercise tolerance  Coronary calcification CT scan images pulled up and reviewed with her in detail today Minimal coronary calcification noted No significant symptoms concerning for angina Denies unstable angina symptoms No further testing at this time Prior lipid panel on record several years ago with well-controlled cholesterol on no statin  Aortic atherosclerosis CT scan images pulled up and reviewed, minimal aortic atherosclerosis noted No further workup needed  Leg swelling Reports symptoms started after she hit her leg with a shovel Was seen in the emergency room for leg swelling, resolved after several doses of Lasix She has decreased the dose from 40 down to 20, denies leg swelling Echocardiogram pending Suspect she will likely be able to wean Lasix down further, recommended she try Lasix 20 every other day    Total encounter time more than 60 minutes  Greater than 50% was spent in counseling and coordination of care with the  patient    Signed, Esmond Plants, M.D., Ph.D. [place], [place]

## 2022-10-09 ENCOUNTER — Telehealth: Payer: Self-pay | Admitting: Internal Medicine

## 2022-10-09 MED ORDER — AZITHROMYCIN 250 MG PO TABS: ORAL_TABLET | ORAL | 0 refills | Status: DC

## 2022-10-09 NOTE — Telephone Encounter (Signed)
I have sent in the script and notified the patient.  Nothing further needed.

## 2022-10-09 NOTE — Telephone Encounter (Signed)
I spoke with the patient. Cough with yellow sputum and sore throat for 3 days No F/C/S Weakness No SOB or wheezing Using her inhalers  Negative Covid test.  [facility]

## 2022-10-09 NOTE — Telephone Encounter (Signed)
zpak  

## 2022-10-19 ENCOUNTER — Telehealth: Payer: Self-pay | Admitting: *Deleted

## 2022-10-19 MED ORDER — FUROSEMIDE 40 MG PO TABS: 20.0000 mg | ORAL_TABLET | Freq: Every day | ORAL | 11 refills | Status: DC | PRN

## 2022-10-19 NOTE — Telephone Encounter (Signed)
Patient called stating she's been taking 24m of lasix daily but feels she doesn't need it. She said she stopped taking it and her weight is down, no edema, no shortness of breath. She asked if she could take it as needed. Per TLinden Dolinok to take daily prn. Pt aware.

## 2022-10-23 ENCOUNTER — Ambulatory Visit
Admission: RE | Admit: 2022-10-23 | Discharge: 2022-10-23 | Disposition: A | Discharge status: Home / Self Care | Payer: Medicare Other | Source: Ambulatory Visit | Attending: Family | Admitting: Family

## 2022-10-23 DIAGNOSIS — R0602 Shortness of breath: Secondary | ICD-10-CM | POA: Diagnosis present

## 2022-10-23 DIAGNOSIS — J449 Chronic obstructive pulmonary disease, unspecified: Secondary | ICD-10-CM | POA: Insufficient documentation

## 2022-10-23 DIAGNOSIS — Z87891 Personal history of nicotine dependence: Secondary | ICD-10-CM | POA: Diagnosis not present

## 2022-10-23 DIAGNOSIS — I509 Heart failure, unspecified: Secondary | ICD-10-CM | POA: Insufficient documentation

## 2022-10-23 LAB — ECHOCARDIOGRAM COMPLETE
AR max vel: 2.62 cm2
AV Area VTI: 2.88 cm2
AV Area mean vel: 2.76 cm2
AV Mean grad: 2 mmHg
AV Peak grad: 4.8 mmHg
Ao pk vel: 1.09 m/s
Area-P 1/2: 2.33 cm2
MV VTI: 3.58 cm2
S' Lateral: 2.7 cm

## 2022-10-23 NOTE — Progress Notes (Signed)
*  PRELIMINARY RESULTS* Echocardiogram 2D Echocardiogram has been performed.  Kelli Phillips 10/23/2022, 12:20 PM

## 2022-10-24 NOTE — Progress Notes (Unsigned)
Patient ID: Kelli Phillips, female    DOB: 06-May-1951, 72 y.o.   MRN: [id number]  HPI  Kelli Phillips is a 72 y/o female with a history of COPD.  Echo 10/23/22 showed an EF of 55-60%  Was in the ED 09/10/22 due to leg swelling. No DVT. Oral lasix started. Was in the ED 06/26/22 due to COPD exacerbation.   She presents today for a HF follow-up visit with a chief complaint of anxiety. Describes this as chronic although intermittent in nature. Says that this has worsened some as she's having issues with her neighbor across the street. Has no other symptoms and specifically denies any difficulty sleeping, dizziness, abdominal distention, palpitations, pedal edema, chest pain, wheezing, SOB, cough or fatigue.   Here to review her echo results.   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   Heart failure Maternal Grandmother    Social History   Tobacco Use   Smoking status: Former    Packs/day: 1.00    Years: 43.00    Total pack years: 43.00    Types: Cigarettes    Quit date: 2009    Years since quitting: 15.1   Smokeless tobacco: Never  Substance Use Topics   Alcohol use: No   Allergies  Allergen Reactions   Morphine And Related Other (See Comments)   Penicillins Rash   Sulfa Antibiotics Rash   Prior to Admission medications   Medication Sig Start Date End Date Taking? Authorizing Provider  albuterol (VENTOLIN HFA) 108 (90 Base) MCG/ACT inhaler TAKE 2 PUFFS BY MOUTH EVERY 6 HOURS AS NEEDED FOR WHEEZE OR SHORTNESS OF BREATH 08/19/22  Yes Kasa, Kelli Bees, MD  Glycopyrrolate-Formoterol (BEVESPI  AEROSPHERE) 9-4.8 MCG/ACT AERO INHALE 2 PUFFS INTO THE LUNGS TWICE A DAY 07/01/22  Yes Flora Lipps, MD  furosemide (LASIX) 40 MG tablet Take 0.5 tablets (20 mg total) by mouth daily as needed for fluid or edema. And additional '20mg'$  as needed Patient not taking: Reported on 10/26/2022 10/19/22   Darylene Price A, FNP  ipratropium-albuterol (DUONEB) 0.5-2.5 (3) MG/3ML SOLN Take 3 mLs by nebulization every 6 (six) hours as needed. Patient not taking: Reported on 10/07/2022 10/03/21   Enzo Bi, MD   Review of Systems  Constitutional:  Negative for appetite change and fatigue.  HENT:  Negative for congestion, postnasal drip and sore throat.   Eyes: Negative.   Respiratory:  Negative for cough, chest tightness, shortness of breath and wheezing.   Cardiovascular:  Negative for chest pain, palpitations and leg swelling.  Gastrointestinal:  Negative for abdominal distention and abdominal pain.  Endocrine: Negative.   Genitourinary: Negative.   Musculoskeletal:  Negative for back pain and neck pain.  Skin: Negative.   Allergic/Immunologic: Negative.   Neurological:  Negative for dizziness and light-headedness.  Hematological:  Negative for adenopathy. Does not bruise/bleed easily.  Psychiatric/Behavioral:  Negative for dysphoric mood and sleep disturbance (sleeping on 2-3 pillows; oxygen PRN at night). The patient  is nervous/anxious.    Vitals:   10/26/22 1240  BP: 121/79  Pulse: 70  SpO2: 96%  Weight: 124 lb 6.4 oz (56.4 kg)   Wt Readings from Last 3 Encounters:  10/26/22 124 lb 6.4 oz (56.4 kg)  10/07/22 126 lb 2 oz (57.2 kg)  09/23/22 123 lb (55.8 kg)   Lab Results  Component Value Date   CREATININE 0.76 09/23/2022   CREATININE 0.65 09/10/2022   CREATININE 0.62 06/16/2022   Physical Exam Vitals and nursing note reviewed.  Constitutional:      Appearance: She is well-developed.  HENT:     Head: Normocephalic and atraumatic.  Neck:     Vascular: No JVD.  Cardiovascular:     Rate and  Rhythm: Normal rate and regular rhythm.  Pulmonary:     Effort: Pulmonary effort is normal.     Breath sounds: No wheezing, rhonchi or rales.  Abdominal:     Palpations: Abdomen is soft.     Tenderness: There is no abdominal tenderness.  Musculoskeletal:     Cervical back: Normal range of motion and neck supple.     Right lower leg: No tenderness. No edema.     Left lower leg: No tenderness. No edema.  Skin:    General: Skin is warm and dry.  Neurological:     Mental Status: She is alert and oriented to person, place, and time.  Psychiatric:        Mood and Affect: Mood normal.        Behavior: Behavior normal.   Assessment & Plan:  1: SOB/ Chronic heart failure preserved ejection fraction- - NYHA class II - euvolemic - weighing daily and call for an overnight weight gain of > 2 pounds or a weekly weight gain of > 5 pounds - weight up 1.4 pounds from last visit here 1 month ago - not adding salt except on rare occasions - reviewed echo results with the patient - saw cardiology [name]) 10/07/22 - BNP 09/10/22 was 90.2  2: COPD- - chest CT done 09/14/22  - saw pulmonology (Kasa) 08/17/22 - oxygen at 2L at bedtime & PRN during the day - BMP 09/23/22 showed sodium 141, potassium 3.8, creatinine 0.76 and GFR >60 - quit smoking 2009   Medication list reviewed.   Due to symptom stability and echo results, will not make a return appt at this time. Advised patient that she could call back at anytime for questions or if she needed another appointment and she was comfortable with this plan.

## 2022-10-26 ENCOUNTER — Ambulatory Visit: Payer: Medicare Other | Attending: Family | Admitting: Family

## 2022-10-26 ENCOUNTER — Encounter: Payer: Self-pay | Admitting: Family

## 2022-10-26 VITALS — BP 121/79 | HR 70 | Wt 124.4 lb

## 2022-10-26 DIAGNOSIS — I2510 Atherosclerotic heart disease of native coronary artery without angina pectoris: Secondary | ICD-10-CM | POA: Diagnosis not present

## 2022-10-26 DIAGNOSIS — J449 Chronic obstructive pulmonary disease, unspecified: Secondary | ICD-10-CM

## 2022-10-26 DIAGNOSIS — R0602 Shortness of breath: Secondary | ICD-10-CM | POA: Diagnosis not present

## 2022-10-26 DIAGNOSIS — I2584 Coronary atherosclerosis due to calcified coronary lesion: Secondary | ICD-10-CM | POA: Diagnosis not present

## 2022-10-26 DIAGNOSIS — Z87891 Personal history of nicotine dependence: Secondary | ICD-10-CM | POA: Diagnosis not present

## 2022-10-26 DIAGNOSIS — I5032 Chronic diastolic (congestive) heart failure: Secondary | ICD-10-CM | POA: Diagnosis present

## 2022-10-26 DIAGNOSIS — J441 Chronic obstructive pulmonary disease with (acute) exacerbation: Secondary | ICD-10-CM | POA: Diagnosis not present

## 2022-10-26 DIAGNOSIS — Z9981 Dependence on supplemental oxygen: Secondary | ICD-10-CM | POA: Diagnosis not present

## 2022-10-26 NOTE — Patient Instructions (Signed)
Call us in the future if you need [place] for anything .

## 2022-10-27 ENCOUNTER — Other Ambulatory Visit: Payer: Self-pay | Admitting: Internal Medicine

## 2022-10-27 DIAGNOSIS — J449 Chronic obstructive pulmonary disease, unspecified: Secondary | ICD-10-CM

## 2022-10-28 ENCOUNTER — Telehealth: Payer: Self-pay | Admitting: Internal Medicine

## 2022-10-28 NOTE — Telephone Encounter (Signed)
Pt states pharmacy stated they needed a new/revised rx for her inhalers

## 2022-10-28 NOTE — Telephone Encounter (Signed)
I spoke with the patient. She said her pharmacy told her to call and get [place] to send in a refill on her Albuterol.  Per our records, we sent in the refill yesterday.  I notified the patient. She will call her pharmacy back and let us know if they need anything else.  Nothing further needed.

## 2022-11-30 ENCOUNTER — Telehealth: Payer: Self-pay | Admitting: Adult Health

## 2022-11-30 MED ORDER — AZITHROMYCIN 250 MG PO TABS: ORAL_TABLET | ORAL | 0 refills | Status: AC

## 2022-11-30 MED ORDER — PREDNISONE 10 MG PO TABS: ORAL_TABLET | ORAL | 0 refills | Status: DC

## 2022-11-30 NOTE — Telephone Encounter (Signed)
Dr. Mortimer Fries patient , hx of COPD  \Patient called in with c/o 3 days of cough/congestion thick mucus and wheezing. Sinus drainage and congestion . No fever or hemoptysis . No chest pain .  Request zpack and pred taper.  Rx sent to pharmacy.  Advised to follow up with Dr .Mortimer Fries as planned , if not improving will need sooner ov.  Please contact office for sooner follow up if symptoms do not improve or worsen or seek emergency care

## 2022-12-07 NOTE — Telephone Encounter (Signed)
Spoke to patient and scheduled appt 01/13/2023 at 2:30. Offered sooner appt for 12/17/2022 and patient declined due to transportation.

## 2022-12-25 ENCOUNTER — Encounter: Payer: Self-pay | Admitting: Adult Health

## 2022-12-25 ENCOUNTER — Ambulatory Visit (INDEPENDENT_AMBULATORY_CARE_PROVIDER_SITE_OTHER): Payer: Medicare Other | Admitting: Adult Health

## 2022-12-25 VITALS — BP 138/80 | HR 58 | Temp 97.7°F | Ht 64.0 in | Wt 126.8 lb

## 2022-12-25 DIAGNOSIS — J9611 Chronic respiratory failure with hypoxia: Secondary | ICD-10-CM | POA: Diagnosis not present

## 2022-12-25 DIAGNOSIS — J309 Allergic rhinitis, unspecified: Secondary | ICD-10-CM | POA: Diagnosis not present

## 2022-12-25 DIAGNOSIS — J441 Chronic obstructive pulmonary disease with (acute) exacerbation: Secondary | ICD-10-CM

## 2022-12-25 NOTE — Assessment & Plan Note (Addendum)
Continue on oxygen 2 L at bedtime and with activity as needed.  Goal is to keep O2 saturations greater than 88 to 90%

## 2022-12-25 NOTE — Progress Notes (Signed)
@Patient  ID: Kelli Phillips, female    DOB: 02/03/1951, 72 y.o.   MRN: [id number]  Chief Complaint  Patient presents with   Follow-up    Referring provider: No ref. 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  Patient recently had a COPD exacerbation.  Was called in a Z-Pak and prednisone taper earlier this month.  Patient says she is feeling better with decreased cough and congestion.  She is back to her regular activities.  She does have some residual postnasal drainage and drippy nose.  Feels that it is from all the pollen outside. She remains on Bevespi twice daily.  Has albuterol and DuoNeb -she uses on occasion. Remains very active , walks 1 mile daily.  On Oxygen 2l/m At bedtime , uses during daytime as needed.  Checks O2 sats At home , they have been doing well.  She has had no increased oxygen demands.   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   COPD (chronic obstructive pulmonary disease) (HCC)     Tobacco History: Social History   Tobacco Use  Smoking Status Former   Packs/day: 1.00   Years: 43.00   Additional pack years: 0.00   Total pack years: 43.00   Types: Cigarettes   Quit date: 2009   Years since quitting: 15.3  Smokeless Tobacco Never   Counseling given: Not Answered   Outpatient Medications Prior to Visit  Medication Sig Dispense Refill   albuterol (VENTOLIN HFA) 108 (90 Base) MCG/ACT  inhaler TAKE 2 PUFFS BY MOUTH EVERY 6 HOURS AS NEEDED FOR WHEEZE OR SHORTNESS OF BREATH 18 each 1   DM-Phenylephrine-Acetaminophen (VICKS DAYQUIL COLD & FLU PO) Take 1 capsule by mouth daily.     Glycopyrrolate-Formoterol (BEVESPI AEROSPHERE) 9-4.8 MCG/ACT AERO INHALE 2 PUFFS INTO THE LUNGS TWICE A DAY 10.7 each 6   OVER THE COUNTER MEDICATION [employer] Allergy Medication. 1 tablet daily     pseudoephedrine (SUDAFED) 30 MG tablet Take 30 mg by mouth every 4 (four) hours as needed for congestion.     furosemide (LASIX) 40 MG tablet Take 0.5 tablets (20 mg total) by mouth daily as needed for fluid or edema. And additional 20mg  as needed (Patient not taking: Reported on 10/26/2022) 30 tablet 11   ipratropium-albuterol (DUONEB) 0.5-2.5 (3) MG/3ML SOLN Take 3 mLs by nebulization every 6 (six) hours as needed. (Patient not taking: Reported on 10/07/2022) 120 mL 2   predniSONE (DELTASONE) 10 MG tablet 4 tabs for 2 days, then 3 tabs for 2 days, 2 tabs for 2 days, then 1 tab for 2 days, then stop (Patient not taking: Reported on 12/25/2022) 20 tablet 0   No facility-administered medications prior to visit.     Review of Systems:   Constitutional:   No  weight loss, night sweats,  Fevers, chills, fatigue, or  lassitude.  HEENT:   No headaches,  Difficulty swallowing,  Tooth/dental problems, or  Sore throat,                No sneezing, itching, ear ache, + nasal congestion, post nasal drip,   CV:  No chest pain,  Orthopnea, PND, swelling in lower extremities, anasarca, dizziness, palpitations, syncope.   GI  No heartburn, indigestion, abdominal pain, nausea, vomiting, diarrhea, change in bowel habits, loss of appetite, bloody stools.   Resp:   No chest wall deformity  Skin: no rash or lesions.  GU: no dysuria, change in color of urine, no urgency or frequency.  No flank pain, no hematuria   MS:  No joint pain or swelling.  No decreased range of motion.  No back pain.    Physical Exam  BP  138/80 (BP Location: Left Arm, Cuff Size: Normal)   Pulse (!) 58   Temp 97.7 F (36.5 C)   Ht 5\' 4"  (1.626 m)   Wt 126 lb 12.8 oz (57.5 kg)   SpO2 96%   BMI 21.77 kg/m   GEN: A/Ox3; pleasant , NAD, well nourished    HEENT:  [place]/AT,  , NOSE-clear, THROAT-clear, no lesions, no postnasal drip or exudate noted.   NECK:  Supple w/ fair ROM; no JVD; normal carotid impulses w/o bruits; no thyromegaly or nodules palpated; no lymphadenopathy.    RESP  Clear  P & A; w/o, wheezes/ rales/ or rhonchi. no accessory muscle use, no dullness to percussion  CARD:  RRR, no m/r/g, no peripheral edema, pulses intact, no cyanosis or clubbing.  GI:   Soft & nt; nml bowel sounds; no organomegaly or masses detected.   Musco: Warm bil, no deformities or joint swelling noted.   Neuro: alert, no focal deficits noted.    Skin: Warm, no lesions or rashes    Lab Results:  CBC    Component Value Date/Time   WBC 4.8 09/10/2022 1223   RBC 4.91 09/10/2022 1223   HGB 14.1 09/10/2022 1223   HGB 14.7 09/04/2014 1811   HCT 44.1 09/10/2022 1223   HCT 46.2 09/04/2014 1811   PLT 178 09/10/2022 1223   PLT 194 09/04/2014 1811   MCV 89.8 09/10/2022 1223   MCV 87 09/04/2014 1811   MCH 28.7 09/10/2022 1223   MCHC 32.0 09/10/2022 1223   RDW 12.8 09/10/2022 1223   RDW 14.4 09/04/2014 1811   LYMPHSABS 1.5 09/10/2022 1223   LYMPHSABS 1.4 09/04/2014 1811   MONOABS 0.3 09/10/2022 1223   MONOABS 0.3 09/04/2014 1811   EOSABS 0.1 09/10/2022 1223   EOSABS 0.0 09/04/2014 1811   BASOSABS 0.0 09/10/2022 1223   BASOSABS 0.0 09/04/2014 1811    BMET    Component Value Date/Time   NA 141 09/23/2022 1521   NA 141 09/04/2014 1811   K 3.8 09/23/2022 1521   K 4.0 09/04/2014 1811   CL 100 09/23/2022 1521   CL 105 09/04/2014 1811   CO2 32 09/23/2022 1521   CO2 31 09/04/2014 1811   GLUCOSE 106 (H) 09/23/2022 1521   GLUCOSE 126 (H) 09/04/2014 1811   BUN 18 09/23/2022 1521   BUN 14 09/04/2014 1811   CREATININE 0.76  09/23/2022 1521   CREATININE 0.70 09/04/2014 1811   CALCIUM 9.3 09/23/2022 1521   CALCIUM 9.1 09/04/2014 1811   GFRNONAA >60 09/23/2022 1521   GFRNONAA >60 09/04/2014 1811   GFRNONAA >60 04/11/2014 0659   GFRAA >60 12/08/2019 1732   GFRAA >60 09/04/2014 1811   GFRAA >60 04/11/2014 0659    BNP  Component Value Date/Time   BNP 90.2 09/10/2022 1223    ProBNP No results found for: "PROBNP"  Imaging: No results found.        No data to display          No results found for: "NITRICOXIDE"      Assessment & Plan:   COPD with acute exacerbation (HCC) Recent COPD exacerbation now resolved.  Patient is back to baseline.  Continue on current maintenance regimen with Bevespi twice daily.  She has albuterol and DuoNeb as needed.  Continue with yearly lung cancer CT screening program  Plan  Patient Instructions  Continue on Bevespi 2 puffs Twice daily   Albuterol or Duoneb As needed   Activity as tolerated.  Continue with Yearly CT chest screenings  Claritin 5mg  At bedtime  As needed  drainage or nasal congestion  Avoid all smoke exposure .  Follow up with Dr. Belia Heman in 6 months and As needed      Allergic rhinitis Mild flare -can try Claritin 5 mg daily as needed  Chronic respiratory failure with hypoxia (HCC) Continue on oxygen 2 L at bedtime and with activity as needed.  Goal is to keep O2 saturations greater than 88 to 90%     Rubye Oaks, NP 12/25/2022

## 2022-12-25 NOTE — Assessment & Plan Note (Signed)
Mild flare -can try Claritin 5 mg daily as needed

## 2022-12-25 NOTE — Assessment & Plan Note (Signed)
Recent COPD exacerbation now resolved.  Patient is back to baseline.  Continue on current maintenance regimen with Bevespi twice daily.  She has albuterol and DuoNeb as needed.  Continue with yearly lung cancer CT screening program  Plan  Patient Instructions  Continue on Bevespi 2 puffs Twice daily   Albuterol or Duoneb As needed   Activity as tolerated.  Continue with Yearly CT chest screenings  Claritin 5mg  At bedtime  As needed  drainage or nasal congestion  Avoid all smoke exposure .  Follow up with Dr. Belia Heman in 6 months and As needed

## 2022-12-25 NOTE — Patient Instructions (Addendum)
Continue on Bevespi 2 puffs Twice daily   Albuterol or Duoneb As needed   Activity as tolerated.  Continue with Yearly CT chest screenings  Claritin 5mg  At bedtime  As needed  drainage or nasal congestion  Avoid all smoke exposure .  Follow up with Dr. Belia Heman in 6 months and As needed

## 2022-12-29 ENCOUNTER — Other Ambulatory Visit: Payer: Self-pay | Admitting: Internal Medicine

## 2022-12-29 DIAGNOSIS — J449 Chronic obstructive pulmonary disease, unspecified: Secondary | ICD-10-CM

## 2023-01-13 ENCOUNTER — Ambulatory Visit: Payer: Medicare Other | Admitting: Internal Medicine

## 2023-02-12 ENCOUNTER — Telehealth: Payer: Self-pay | Admitting: Internal Medicine

## 2023-02-12 NOTE — Telephone Encounter (Addendum)
I spoke with the patient.  Symptoms for 2 days- she said she went and had her hair fixed and she sprayed something. She felt bad after that. No F/C/S No SOB or wheezing. Occasional cough with clear sputum with a tint of yellow. Drainage in her throat, that makes her nauseous. Ear pain 2L O2 QHS  Using- Albuterol- BID Bevespi- BID Advil Sinus- QD  No prednisone or Duoneb  Tammy can you advise since Dr. Belia Heman is out of the office?  She also wants to know if you can send in something for nausea?

## 2023-02-12 NOTE — Telephone Encounter (Signed)
Pt states she had a sinus infection and now she

## 2023-02-12 NOTE — Telephone Encounter (Signed)
Recommend Claritin or Zyrtec 10mg  At bedtime  for drainage for 5 days and then As needed   Saline nasal rinses As needed    If symptoms not improving, develop cough /wheezing , can take Prednisone 20mg  daily for 5 days #5 , no refills.   Zofran 4mg  1 every 8hr as needed for nause, #10 , no refills   Please contact office for sooner follow up if symptoms do not improve or worsen or seek emergency care

## 2023-02-12 NOTE — Telephone Encounter (Signed)
ATC the patient. LVM for the patient to return my call. I call once more do to the nature of the call.

## 2023-02-12 NOTE — Telephone Encounter (Signed)
Pt states her phones has been acting up, but she is still awaiting for her medicine to be called in

## 2023-02-12 NOTE — Telephone Encounter (Signed)
ATC the patient. LVM for her to return my call.

## 2023-02-12 NOTE — Telephone Encounter (Signed)
Pt is asking if possible to get something for her sinus infection states it is making her dizzy and starting to make her nauseus

## 2023-02-15 ENCOUNTER — Telehealth: Payer: Self-pay

## 2023-02-15 NOTE — Telephone Encounter (Signed)
Created in error

## 2023-02-15 NOTE — Telephone Encounter (Signed)
See previous encounter, patient is calling because she is needing medication called in for sinus infection. Patient states her phones is acting up, but the number is [phone]. Please advise.

## 2023-02-15 NOTE — Telephone Encounter (Signed)
Lm for patient.  Need to verify preferred pharmacy.   

## 2023-02-15 NOTE — Telephone Encounter (Signed)
Lm for patient.  Will close encounter per office protocol.  Letter has been mailed to address on file.  Nothing further needed.

## 2023-02-16 ENCOUNTER — Telehealth: Payer: Self-pay | Admitting: Internal Medicine

## 2023-02-16 MED ORDER — ONDANSETRON HCL 4 MG PO TABS: 4.0000 mg | ORAL_TABLET | Freq: Three times a day (TID) | ORAL | 0 refills | Status: DC | PRN

## 2023-02-16 MED ORDER — PREDNISONE 20 MG PO TABS: 20.0000 mg | ORAL_TABLET | Freq: Every day | ORAL | 0 refills | Status: DC

## 2023-02-16 NOTE — Telephone Encounter (Signed)
Please see telephone encounter from 02/12/2023. Medications have been sent in.   Nothing further needed.

## 2023-02-16 NOTE — Telephone Encounter (Signed)
Loni Dolly   02/16/23 12:46 PM Note [facility] [address] in [place] is the Pharm. Please see last signed encounter.         I have sent in the medications. ATC the patient. LVM for the patient to return my call.

## 2023-02-16 NOTE — Telephone Encounter (Signed)
Lm x2 for patient Will close encounter per office protocol. Letter mailed to address on file.    

## 2023-02-16 NOTE — Telephone Encounter (Signed)
[facility] [address] in [place] is the [place]. Please see last signed encounter.

## 2023-02-16 NOTE — Addendum Note (Signed)
Addended by: Bonney Leitz on: 02/16/2023 01:40 PM   Modules accepted: Orders

## 2023-02-17 NOTE — Telephone Encounter (Signed)
Lm x2 for patient.  Will close encounter per office protocol.   

## 2023-02-19 ENCOUNTER — Other Ambulatory Visit: Payer: Self-pay | Admitting: Internal Medicine

## 2023-02-19 DIAGNOSIS — J449 Chronic obstructive pulmonary disease, unspecified: Secondary | ICD-10-CM

## 2023-03-03 ENCOUNTER — Telehealth: Payer: Self-pay | Admitting: Internal Medicine

## 2023-03-03 NOTE — Telephone Encounter (Signed)
Called and spoke to patient.  She reports of trouble urinating. She stated that she is urinating small amounts. She feels that she needs a fluid pee. I have recommended that she contact PCP or cardiology.  She voiced her understanding. She stated that she was under the impression that she did contact PCP. She is aware that she called pulmonary.  Nothing further needed.

## 2023-03-03 NOTE — Telephone Encounter (Signed)
Pt feels like she needs the medication that helps remove fluids

## 2023-03-05 ENCOUNTER — Telehealth: Payer: Self-pay

## 2023-03-05 NOTE — Telephone Encounter (Addendum)
Patient called clinic on July 3rd at 5:15pm and left a message for clinic staff. She stated that she is urinating small amounts. She feels that she needs a fluid pill. Called and spoke with patient who stated that she  no swelling of feet or legs and has no weight gain. Patient states she feels pressure in her abdomen.   Please advise.     Per Clarisa Kindred, FNP called and spoke with patient and informed her that she needs a PCP/urgent care visit to rule out a possible UTI. Patient was offered an appointment with Inetta Fermo but declined and stated she would go see either her PCP or urgent care to have her urine tested. Informed patient on the importance of getting seen and patient verbalized understanding.

## 2023-03-06 ENCOUNTER — Encounter: Payer: Self-pay | Admitting: Emergency Medicine

## 2023-03-06 ENCOUNTER — Other Ambulatory Visit: Payer: Self-pay

## 2023-03-06 ENCOUNTER — Emergency Department
Admission: EM | Admit: 2023-03-06 | Discharge: 2023-03-06 | Disposition: A | Discharge status: Home / Self Care | Payer: Medicare Other | Attending: Emergency Medicine | Admitting: Emergency Medicine

## 2023-03-06 DIAGNOSIS — R300 Dysuria: Secondary | ICD-10-CM | POA: Diagnosis not present

## 2023-03-06 DIAGNOSIS — R1030 Lower abdominal pain, unspecified: Secondary | ICD-10-CM | POA: Insufficient documentation

## 2023-03-06 DIAGNOSIS — H9202 Otalgia, left ear: Secondary | ICD-10-CM | POA: Diagnosis not present

## 2023-03-06 DIAGNOSIS — J029 Acute pharyngitis, unspecified: Secondary | ICD-10-CM | POA: Diagnosis not present

## 2023-03-06 DIAGNOSIS — J449 Chronic obstructive pulmonary disease, unspecified: Secondary | ICD-10-CM | POA: Insufficient documentation

## 2023-03-06 LAB — CBC WITH DIFFERENTIAL/PLATELET
Abs Immature Granulocytes: 0.02 10*3/uL (ref 0.00–0.07)
Basophils Absolute: 0.1 10*3/uL (ref 0.0–0.1)
Basophils Relative: 1 %
Eosinophils Absolute: 0.1 10*3/uL (ref 0.0–0.5)
Eosinophils Relative: 2 %
HCT: 45.4 % (ref 36.0–46.0)
Hemoglobin: 14.5 g/dL (ref 12.0–15.0)
Immature Granulocytes: 0 %
Lymphocytes Relative: 38 %
Lymphs Abs: 2.4 10*3/uL (ref 0.7–4.0)
MCH: 28.8 pg (ref 26.0–34.0)
MCHC: 31.9 g/dL (ref 30.0–36.0)
MCV: 90.1 fL (ref 80.0–100.0)
Monocytes Absolute: 0.4 10*3/uL (ref 0.1–1.0)
Monocytes Relative: 6 %
Neutro Abs: 3.4 10*3/uL (ref 1.7–7.7)
Neutrophils Relative %: 53 %
Platelets: 180 10*3/uL (ref 150–400)
RBC: 5.04 MIL/uL (ref 3.87–5.11)
RDW: 12.8 % (ref 11.5–15.5)
WBC: 6.5 10*3/uL (ref 4.0–10.5)
nRBC: 0 % (ref 0.0–0.2)

## 2023-03-06 LAB — BASIC METABOLIC PANEL
Anion gap: 7 (ref 5–15)
BUN: 17 mg/dL (ref 8–23)
CO2: 29 mmol/L (ref 22–32)
Calcium: 8.9 mg/dL (ref 8.9–10.3)
Chloride: 103 mmol/L (ref 98–111)
Creatinine, Ser: 0.76 mg/dL (ref 0.44–1.00)
GFR, Estimated: >60 mL/min (ref >60)
Glucose, Bld: 91 mg/dL (ref 70–99)
Potassium: 3.1 mmol/L — ABNORMAL LOW (ref 3.5–5.1)
Sodium: 139 mmol/L (ref 135–145)

## 2023-03-06 LAB — WET PREP, GENITAL
Clue Cells Wet Prep HPF POC: NONE SEEN
Sperm: NONE SEEN
Trich, Wet Prep: NONE SEEN
WBC, Wet Prep HPF POC: <10 (ref <10)
Yeast Wet Prep HPF POC: NONE SEEN

## 2023-03-06 LAB — URINALYSIS, ROUTINE W REFLEX MICROSCOPIC
Bilirubin Urine: NEGATIVE
Glucose, UA: NEGATIVE mg/dL
Hgb urine dipstick: NEGATIVE
Ketones, ur: NEGATIVE mg/dL
Leukocytes,Ua: NEGATIVE
Nitrite: NEGATIVE
Protein, ur: NEGATIVE mg/dL
Specific Gravity, Urine: 1.004 — ABNORMAL LOW (ref 1.005–1.030)
pH: 6 (ref 5.0–8.0)

## 2023-03-06 LAB — GROUP A STREP BY PCR: Group A Strep by PCR: NOT DETECTED

## 2023-03-06 NOTE — ED Triage Notes (Signed)
Pt to ED via POV c/o difficulty urinating. Pt states that she is using the rest room but she cannot get a lot out and she is having urgency. Pt states that she is also having pain in her lower back.

## 2023-03-06 NOTE — ED Provider Notes (Signed)
[facility] Provider Note    Event Date/Time   First MD Initiated Contact with Patient 03/06/23 1058     (approximate)   History   Dysuria   HPI  JOHNATHAN CALLERY is a 72 y.o. female with a past medical history of COPD no longer smoking, who presents today for evaluation of dysuria.  Patient reports that for the last few days she has had feeling of incomplete bladder emptying and urinary urgency.  She has been drinking a lot of soda recently.  She has mild suprapubic pressure as well.  She reports that she drank a liter of water and her symptoms resolved.  She "just wants to get checked out."  She also notes that she has had a sore throat and left ear pain for the past couple of days as well.  No fevers or chills.  No voice change.  No trouble swallowing.  No neck pain or stiffness.  Patient Active Problem List   Diagnosis Date Noted   Chronic respiratory failure with hypoxia (HCC) 12/25/2022   Acute hypoxemic respiratory failure (HCC) 10/02/2021   COPD with acute exacerbation (HCC) 10/01/2021   Acute gastritis 10/01/2021   Nocturnal hypoxia 11/08/2020   Aortic atherosclerosis (HCC) 11/08/2020   Allergic rhinitis 05/20/2020   Personal history of tobacco use, presenting hazards to health 05/12/2018   Adenomatous colon polyp 08/14/2014   Midline low back pain with sciatica 08/14/2014   Pulmonary emphysema (HCC) 08/14/2014   COPD, very severe (HCC) 05/16/2014   Rectal bleeding 01/30/2014          Physical Exam   Triage Vital Signs: ED Triage Vitals  Enc Vitals Group     BP 03/06/23 0957 (!) 152/79     Pulse Rate 03/06/23 0957 80     Resp 03/06/23 0957 16     Temp 03/06/23 0957 97.8 F (36.6 C)     Temp Source 03/06/23 0957 Oral     SpO2 03/06/23 0957 92 %     Weight 03/06/23 0958 120 lb (54.4 kg)     Height 03/06/23 0958 5\' 4"  (1.626 m)     Head Circumference --      Peak Flow --      Pain Score 03/06/23 0958 2     Pain Loc --      Pain  Edu? --      Excl. in GC? --     Most recent vital signs: Vitals:   03/06/23 0957  BP: (!) 152/79  Pulse: 80  Resp: 16  Temp: 97.8 F (36.6 C)  SpO2: 92%    Physical Exam Vitals and nursing note reviewed.  Constitutional:      General: Awake and alert. No acute distress.    Appearance: Normal appearance. The patient is normal weight.  HENT:     Head: Normocephalic and atraumatic.     Mouth: Mucous membranes are moist. Uvula midline.  Mild oropharyngeal erythema.  No tonsillar exudate.  No soft palate fluctuance.  No trismus.  No voice change.  No sublingual swelling.  No tender cervical lymphadenopathy.  No nuchal rigidity Normal TM bilaterally, clear canals Eyes:     General: PERRL. Normal EOMs        Right eye: No discharge.        Left eye: No discharge.     Conjunctiva/sclera: Conjunctivae normal.  Cardiovascular:     Rate and Rhythm: Normal rate and regular rhythm.     Pulses: Normal pulses.  Pulmonary:     Effort: Pulmonary effort is normal. No respiratory distress.     Breath sounds: Normal breath sounds.  Abdominal:     Abdomen is soft. There is no abdominal tenderness. No rebound or guarding. No distention. Pelvic: did not tolerate speculum exam. Small possible cystocele noted, no vaginal discharge or bleeding. No CMT. No adnexal fullness or tenderness Musculoskeletal:        General: No swelling. Normal range of motion.     Cervical back: Normal range of motion and neck supple.  Skin:    General: Skin is warm and dry.     Capillary Refill: Capillary refill takes less than 2 seconds.     Findings: No rash.  Neurological:     Mental Status: The patient is awake and alert.      ED Results / Procedures / Treatments   Labs (all labs ordered are listed, but only abnormal results are displayed) Labs Reviewed  URINALYSIS, ROUTINE W REFLEX MICROSCOPIC - Abnormal; Notable for the following components:      Result Value   Color, Urine STRAW (*)    APPearance  CLEAR (*)    Specific Gravity, Urine 1.004 (*)    All other components within normal limits  BASIC METABOLIC PANEL - Abnormal; Notable for the following components:   Potassium 3.1 (*)    All other components within normal limits  GROUP A STREP BY PCR  WET PREP, GENITAL  CBC WITH DIFFERENTIAL/PLATELET     EKG     RADIOLOGY     PROCEDURES:  Critical Care performed:   Procedures   MEDICATIONS ORDERED IN ED: Medications - No data to display   IMPRESSION / MDM / ASSESSMENT AND PLAN / ED COURSE  I reviewed the triage vital signs and the nursing notes.   Differential diagnosis includes, but is not limited to, cystocele, UTI, pyelo, candida.   Patient is awake and alert, hemodynamically stable and afebrile. She is non-toxic appearing.  She appears to have a small cystocele on exam, which may be contributing to her symptoms as well.  Her urinalysis does not appear to be consistent with infection.  Wet prep was negative for yeast and trichomoniasis.  Labs are overall reassuring as well.  Given her sore throat, strep test was obtained and is negative.  She has no uvular deviation, tonsillar hypertrophy, tonsillar exudate, voice changes, trismus, drooling, nuchal rigidity.  I do not suspect peritonsillar or retropharyngeal abscess.  Most likely viral pharyngitis.  I recommended outpatient follow-up with gynecology, she would specifically benefit from urogynecology, for further workup and management.  The appropriate follow-up information was provided.  We discussed return precautions the importance of close outpatient follow-up.  Patient understands and agrees with plan.  She was discharged in stable condition.   Patient's presentation is most consistent with acute complicated illness / injury requiring diagnostic workup.    FINAL CLINICAL IMPRESSION(S) / ED DIAGNOSES   Final diagnoses:  Dysuria     Rx / DC Orders   ED Discharge Orders     None        Note:   This document was prepared using Dragon voice recognition software and may include unintentional dictation errors.   Jackelyn Hoehn, PA-C 03/06/23 1459    Sharyn Creamer, MD 03/06/23 [phone]

## 2023-03-06 NOTE — Discharge Instructions (Addendum)
Your blood test, urinalysis, and strep test were negative.  Your wet prep was also negative for infection.  Please follow-up with gynecology as they may be able to help you with what could possibly be a cystocele.  Please return for any new, worsening, or change in symptoms or other concerns.  It was a pleasure caring for you today.

## 2023-03-09 ENCOUNTER — Telehealth: Payer: Self-pay

## 2023-03-11 NOTE — Progress Notes (Signed)
Pt called to notify Inetta Fermo that she was seen for being unable to urinate and was told she has a bladder issue and urology will be seeing her 7/31st @ 10:15AM.

## 2023-03-17 ENCOUNTER — Telehealth: Payer: Self-pay | Admitting: Obstetrics and Gynecology

## 2023-03-17 NOTE — Telephone Encounter (Signed)
Called the patient twice, and unable to leave a message. To change her appointment to April 13, 2023 with Dr. Logan Bores at 10:15 am ( He will be in surgery)

## 2023-03-19 NOTE — Telephone Encounter (Signed)
I contacted the patient via phone. No answer,  was unable to leave message.

## 2023-03-23 ENCOUNTER — Encounter: Payer: Self-pay | Admitting: Obstetrics and Gynecology

## 2023-03-23 NOTE — Telephone Encounter (Signed)
I contacted the patient via phone. NO answer, was unable to leave message. The patient doesn't have my chart. I will send letter in mail.

## 2023-03-30 ENCOUNTER — Telehealth: Payer: Self-pay

## 2023-03-30 NOTE — Telephone Encounter (Signed)
The patient has called back and confirmed rescheduled appointment for Dr. Logan Bores on 04/13/23.

## 2023-03-30 NOTE — Telephone Encounter (Signed)
Pt calling; has questions about her upcoming appt; she has had problems with her kidneys before but now they are doing good.  Was asking if she wasn't able to pee would they take to surgery and was told probably so. She is scared and nervous about this appt.  [phone]  Pt very appreciative of call; adv this appt is an ER f/u appt; adv it would probably be better to write down her questions and bring them in for her appt; went over time for her to be here as she has to ride the bus.  States she has COPD; walks a mile a day; has no pain; doing well for her age.

## 2023-03-31 ENCOUNTER — Encounter: Payer: Medicare Other | Admitting: Obstetrics and Gynecology

## 2023-04-13 ENCOUNTER — Other Ambulatory Visit (HOSPITAL_COMMUNITY)
Admission: RE | Admit: 2023-04-13 | Discharge: 2023-04-13 | Disposition: A | Discharge status: Home / Self Care | Payer: Medicare Other | Source: Ambulatory Visit | Attending: Obstetrics and Gynecology | Admitting: Obstetrics and Gynecology

## 2023-04-13 ENCOUNTER — Ambulatory Visit (INDEPENDENT_AMBULATORY_CARE_PROVIDER_SITE_OTHER): Payer: Medicare Other | Admitting: Obstetrics and Gynecology

## 2023-04-13 ENCOUNTER — Encounter: Payer: Self-pay | Admitting: Obstetrics and Gynecology

## 2023-04-13 VITALS — BP 122/77 | HR 72 | Ht 64.0 in | Wt 124.4 lb

## 2023-04-13 DIAGNOSIS — Z113 Encounter for screening for infections with a predominantly sexual mode of transmission: Secondary | ICD-10-CM | POA: Insufficient documentation

## 2023-04-13 DIAGNOSIS — R102 Pelvic and perineal pain: Secondary | ICD-10-CM | POA: Insufficient documentation

## 2023-04-13 DIAGNOSIS — Z7689 Persons encountering health services in other specified circumstances: Secondary | ICD-10-CM

## 2023-04-13 DIAGNOSIS — N898 Other specified noninflammatory disorders of vagina: Secondary | ICD-10-CM | POA: Diagnosis not present

## 2023-04-13 LAB — POCT URINALYSIS DIPSTICK
Bilirubin, UA: NEGATIVE
Blood, UA: NEGATIVE
Glucose, UA: NEGATIVE
Ketones, UA: NEGATIVE
Leukocytes, UA: NEGATIVE
Nitrite, UA: NEGATIVE
Protein, UA: NEGATIVE
Spec Grav, UA: 1.02 (ref 1.010–1.025)
Urobilinogen, UA: 0.2 E.U./dL
pH, UA: 5 (ref 5.0–8.0)

## 2023-04-13 NOTE — Progress Notes (Signed)
Patient presents today to discuss ongoing pelvic pressure and vaginal discharge. She states experiencing pelvic pressure and back pain that sent her to the ED, negative UA at the time. She reports a vaginal discharge along with odor, would like NuSwab with STD testing.

## 2023-04-13 NOTE — Progress Notes (Signed)
HPI:      [name] is a 72 y.o. No obstetric history on file. who LMP was No LMP recorded. Patient is postmenopausal.  Subjective:   She presents today as a follow-up after an emergency department visit where she complained of vaginal symptoms.  She states that she has an odor but no discharge.  She has also had some pressure symptoms that she thought was related to her urine.  No evidence of bladder infection today or at her emergency department visit. Patient is not currently sexually active but had a new sexual partner several months ago.  She says that she always uses condoms.  She would like to be tested for STDs today but only vaginal STDs.    Hx: The following portions of the patient's history were reviewed and updated as appropriate:             She  has a past medical history of Cancer (HCC) and COPD (chronic obstructive pulmonary disease) (HCC). She does not have any pertinent problems on file. She  has a past surgical history that includes Colon surgery and Cesarean section. Her family history includes Breast cancer in her paternal aunt; Colon cancer in her father; Heart attack (age of onset: 45) in her brother; Heart failure in her maternal grandmother, mother, and sister; Lung cancer in her brother. She  reports that she quit smoking about 15 years ago. Her smoking use included cigarettes. She started smoking about 58 years ago. She has a 43 pack-year smoking history. She has never used smokeless tobacco. She reports that she does not drink alcohol and does not use drugs. She has a current medication list which includes the following prescription(s): albuterol, dm-phenylephrine-acetaminophen, bevespi aerosphere, ondansetron, furosemide, ipratropium-albuterol, OVER THE COUNTER MEDICATION, prednisone, and pseudoephedrine. She is allergic to morphine and codeine, penicillins, and sulfa antibiotics.       Review of Systems:  Review of Systems  Constitutional: Denied  constitutional symptoms, night sweats, recent illness, fatigue, fever, insomnia and weight loss.  Eyes: Denied eye symptoms, eye pain, photophobia, vision change and visual disturbance.  Ears/Nose/Throat/Neck: Denied ear, nose, throat or neck symptoms, hearing loss, nasal discharge, sinus congestion and sore throat.  Cardiovascular: Denied cardiovascular symptoms, arrhythmia, chest pain/pressure, edema, exercise intolerance, orthopnea and palpitations.  Respiratory: Denied pulmonary symptoms, asthma, pleuritic pain, productive sputum, cough, dyspnea and wheezing.  Gastrointestinal: Denied, gastro-esophageal reflux, melena, nausea and vomiting.  Genitourinary: See HPI for additional information.  Musculoskeletal: Denied musculoskeletal symptoms, stiffness, swelling, muscle weakness and myalgia.  Dermatologic: Denied dermatology symptoms, rash and scar.  Neurologic: Denied neurology symptoms, dizziness, headache, neck pain and syncope.  Psychiatric: Denied psychiatric symptoms, anxiety and depression.  Endocrine: Denied endocrine symptoms including hot flashes and night sweats.   Meds:   Current Outpatient Medications on File Prior to Visit  Medication Sig Dispense Refill   albuterol (VENTOLIN HFA) 108 (90 Base) MCG/ACT inhaler TAKE 2 PUFFS BY MOUTH EVERY 6 HOURS AS NEEDED FOR WHEEZE OR SHORTNESS OF BREATH 18 each 5   DM-Phenylephrine-Acetaminophen (VICKS DAYQUIL COLD & FLU PO) Take 1 capsule by mouth daily.     Glycopyrrolate-Formoterol (BEVESPI AEROSPHERE) 9-4.8 MCG/ACT AERO INHALE 2 PUFFS INTO THE LUNGS TWICE A DAY 10.7 each 6   ondansetron (ZOFRAN) 4 MG tablet Take 1 tablet (4 mg total) by mouth every 8 (eight) hours as needed for nausea or vomiting. 10 tablet 0   furosemide (LASIX) 40 MG tablet Take 0.5 tablets (20 mg total) by mouth daily as  needed for fluid or edema. And additional 20mg  as needed (Patient not taking: Reported on 10/26/2022) 30 tablet 11   ipratropium-albuterol (DUONEB)  0.5-2.5 (3) MG/3ML SOLN Take 3 mLs by nebulization every 6 (six) hours as needed. (Patient not taking: Reported on 10/07/2022) 120 mL 2   OVER THE COUNTER MEDICATION [employer] Allergy Medication. 1 tablet daily (Patient not taking: Reported on 02/12/2023)     predniSONE (DELTASONE) 20 MG tablet Take 1 tablet (20 mg total) by mouth daily. (Patient not taking: Reported on 04/13/2023) 5 tablet 0   pseudoephedrine (SUDAFED) 30 MG tablet Take 30 mg by mouth every 4 (four) hours as needed for congestion. (Patient not taking: Reported on 02/12/2023)     No current facility-administered medications on file prior to visit.      Objective:     Vitals:   04/13/23 1027 04/13/23 1037  BP: (!) 149/79 122/77  Pulse: 72    Filed Weights   04/13/23 1027  Weight: 124 lb 6.4 oz (56.4 kg)              Physical examination   Pelvic:   Vulva: Normal appearance.  No lesions.  Vagina: No lesions or abnormalities noted.  Support: Normal pelvic support.  Small cystocele.  Urethra No masses tenderness or scarring.  Meatus Normal size without lesions or prolapse.  Cervix: Normal appearance.  No lesions.  Anus: Normal exam.  No lesions.  Perineum: Normal exam.  No lesions.        Bimanual   Uterus: Normal size.  Non-tender.  Mobile.  AV.  Adnexae: No masses.  Non-tender to palpation.  Cul-de-sac: Negative for abnormality.             Assessment:    No obstetric history on file. Patient Active Problem List   Diagnosis Date Noted   Chronic respiratory failure with hypoxia (HCC) 12/25/2022   Acute hypoxemic respiratory failure (HCC) 10/02/2021   COPD with acute exacerbation (HCC) 10/01/2021   Acute gastritis 10/01/2021   Nocturnal hypoxia 11/08/2020   Aortic atherosclerosis (HCC) 11/08/2020   Allergic rhinitis 05/20/2020   Personal history of tobacco use, presenting hazards to health 05/12/2018   Adenomatous colon polyp 08/14/2014   Midline low back pain with sciatica 08/14/2014   Pulmonary  emphysema (HCC) 08/14/2014   COPD, very severe (HCC) 05/16/2014   Rectal bleeding 01/30/2014     1. Establishing care with new doctor, encounter for   2. Pelvic pressure in female   3. Vaginal discharge   4. Vaginal odor   5. Screening for STD (sexually transmitted disease)     Symptoms most likely consistent with BV.   Plan:            1.  Patient desires Nuswab for vaginal infections today including STDs.  Will contact her when the results return.  Small cystocele likely not her issue. Orders Orders Placed This Encounter  Procedures   POCT Urinalysis Dipstick    No orders of the defined types were placed in this encounter.     F/U  Return for We will contact her with any abnormal test results, Pt to contact us if symptoms worsen. I spent 31 minutes involved in the care of this patient preparing to see the patient by obtaining and reviewing her medical history (including labs, imaging tests and prior procedures), documenting clinical information in the electronic health record (EHR), counseling and coordinating care plans, writing and sending prescriptions, ordering tests or procedures and in direct  communicating with the patient and medical staff discussing pertinent items from her history and physical exam.  Elonda Husky, M.D. 04/13/2023 10:49 AM

## 2023-04-19 ENCOUNTER — Telehealth: Payer: Self-pay

## 2023-04-19 NOTE — Telephone Encounter (Signed)
Patient inquiring about 04/13/23 swab results. Advised all negative. Patient requesting a copy of results. She does not have my chart. Advised she will need to sign a release form. Patient states she has to take the bus to get here. Discussed with patient she can get her results thru my chart if she has access to a computer or thru her cell phone. Patient agreed to receive my chart link via text. Invite sent. Patient received and was able to activate and see results.

## 2023-04-23 ENCOUNTER — Encounter: Payer: Medicare Other | Admitting: Family

## 2023-04-28 ENCOUNTER — Other Ambulatory Visit: Payer: Self-pay | Admitting: Internal Medicine

## 2023-04-28 DIAGNOSIS — J449 Chronic obstructive pulmonary disease, unspecified: Secondary | ICD-10-CM

## 2023-04-29 ENCOUNTER — Telehealth: Payer: Self-pay

## 2023-04-29 NOTE — Telephone Encounter (Signed)
Pt called the after hour nurse stating since her visit here she is coughing, sneezing, runny nose, chills, body aches and headache.Sxs started two days ago; no fever.  Tested negative for covid and wants to know if the office is clear.; didn't know if she got it from the office.  [phone]  Unable to reach pt to let her know we have been well. Will try sending a mychart message.

## 2023-05-17 ENCOUNTER — Telehealth: Payer: Self-pay | Admitting: Internal Medicine

## 2023-05-17 NOTE — Telephone Encounter (Signed)
Patient states needs referral for breat mammogram. Patient phone number is [phone].

## 2023-05-17 NOTE — Telephone Encounter (Signed)
Called and spoke to patient. She is requesting referral for mammogram. I have recommended that she contact PCP.  Nothing further needed.

## 2023-05-24 ENCOUNTER — Telehealth: Payer: Self-pay | Admitting: Internal Medicine

## 2023-05-24 NOTE — Telephone Encounter (Signed)
Lm x1 for the patient.

## 2023-05-24 NOTE — Telephone Encounter (Signed)
Symptoms for 3 days.  No fevers/chills/sweats Cough with green sputum A little wheezing. "Ears are stuffy" 2L O2 QHS  Albuterol - Rarely Bevespi- 2 puffs bid Advil Sinus- as needed  Has had 2 negative Covid  [facility]

## 2023-05-24 NOTE — Telephone Encounter (Signed)
Pt states to call [phone]. Chest is tight coughing up thick green sputum. Has taken 2 negative covid tests.

## 2023-05-25 MED ORDER — PREDNISONE 20 MG PO TABS: 20.0000 mg | ORAL_TABLET | Freq: Every day | ORAL | 0 refills | Status: DC

## 2023-05-25 MED ORDER — AZITHROMYCIN 250 MG PO TABS: ORAL_TABLET | ORAL | 0 refills | Status: DC

## 2023-05-25 NOTE — Telephone Encounter (Signed)
Lm x2 for patient. Will call once more due to nature of call.

## 2023-05-25 NOTE — Telephone Encounter (Signed)
I have notified the patient and send in the prescriptions.  Nothing further needed.

## 2023-06-16 ENCOUNTER — Encounter: Payer: Medicare Other | Admitting: Family

## 2023-07-02 ENCOUNTER — Encounter: Payer: Self-pay | Admitting: Primary Care

## 2023-07-02 ENCOUNTER — Ambulatory Visit (INDEPENDENT_AMBULATORY_CARE_PROVIDER_SITE_OTHER): Payer: Medicare Other | Admitting: Primary Care

## 2023-07-02 VITALS — BP 126/84 | HR 83 | Temp 97.8°F | Ht 64.0 in | Wt 125.0 lb

## 2023-07-02 DIAGNOSIS — J302 Other seasonal allergic rhinitis: Secondary | ICD-10-CM

## 2023-07-02 DIAGNOSIS — J9611 Chronic respiratory failure with hypoxia: Secondary | ICD-10-CM | POA: Diagnosis not present

## 2023-07-02 DIAGNOSIS — J449 Chronic obstructive pulmonary disease, unspecified: Secondary | ICD-10-CM | POA: Diagnosis not present

## 2023-07-02 DIAGNOSIS — R911 Solitary pulmonary nodule: Secondary | ICD-10-CM | POA: Diagnosis not present

## 2023-07-02 MED ORDER — GUAIFENESIN ER 600 MG PO TB12: 600.0000 mg | ORAL_TABLET | Freq: Two times a day (BID) | ORAL | 1 refills | Status: DC | PRN

## 2023-07-02 MED ORDER — LORATADINE 10 MG PO TABS: 10.0000 mg | ORAL_TABLET | Freq: Every day | ORAL | 11 refills | Status: DC

## 2023-07-02 NOTE — Progress Notes (Signed)
@Patient  ID: Kelli Phillips, female    DOB: 1951/01/03, 72 y.o.   MRN: [id number]  Discussed the use of AI scribe software for clinical note transcription with the patient, who gave verbal consent to proceed.  Chief Complaint  Patient presents with   Follow-up    No SOB, wheezing or cough.    Referring provider: No ref. provider found  HPI: 72 year old female, former smoker quit in 2009 (40 pack year hx). PMH significant for severe COPD, pulmonary emphysema, chronic respiratory failure. Patient of Dr. Belia Heman. Following with lung cancer screening program  07/02/2023 Patient presents today for regular visit. Followed by our office for history of COPD, pulmonary emphysema and nocturnal hypoxia.  Patient was last seen in April for COPD exacerbation and is currently maintained on Bevespi and prn Ventolin/duonebs.   She is doing well, no acute complaints. Breathing has been ok. She was sick in September and treated with zpack and prednisone, symptoms resolved. She has some PND which is causing throat clearing/dry cough. The patient continues to experience postnasal drip, which she manages with over-the-counter pseudoephedrine. However, she reports a history of coronary artery disease and is advised to use pseudoephedrine sparingly due to its potential to raise blood pressure. The patient also reports a history of smoking, which she quit approximately 30 years ago.  The patient uses a Ventolin rescue inhaler twice a day, which she reports is effective in managing her symptoms. She also uses oxygen primarily at night and occasionally during the day, depending on her oxygen levels. The patient reports her oxygen levels usually run in the nineties, but she is aware to use her oxygen if levels drop below 88%.  The patient also reports occasional cramping in the neck, which she has managed independently. She denies any neuro changes, trouble with speech, or vision changes, aside from cataracts.     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   COPD (chronic obstructive pulmonary disease) (HCC)     Tobacco History: Social History   Tobacco Use  Smoking Status Former   Current packs/day: 0.00   Average packs/day: 1 pack/day for 43.0 years (43.0 ttl pk-yrs)   Types: Cigarettes   Start date: 1966   Quit date: 2009   Years since quitting: 15.8  Smokeless Tobacco Never   Counseling given: Not Answered   Outpatient Medications Prior to Visit  Medication Sig Dispense Refill   albuterol (VENTOLIN HFA) 108 (90 Base) MCG/ACT inhaler TAKE 2 PUFFS BY MOUTH EVERY 6 HOURS AS NEEDED FOR WHEEZE OR SHORTNESS OF BREATH 18 each 5   BEVESPI AEROSPHERE 9-4.8 MCG/ACT AERO INHALE 2 PUFFS INTO THE LUNGS TWICE A DAY 10.7 each 6   furosemide (LASIX) 40 MG tablet Take 0.5 tablets (20 mg total) by mouth daily as needed for fluid or edema. And additional 20mg  as needed 30 tablet 11   ipratropium-albuterol (DUONEB) 0.5-2.5 (3) MG/3ML SOLN Take 3 mLs by nebulization every 6 (six) hours as needed. 120 mL 2   ondansetron (ZOFRAN) 4 MG tablet Take 1 tablet (4 mg total) by mouth every 8 (eight) hours as needed for nausea or vomiting. 10 tablet 0   pseudoephedrine (SUDAFED) 30 MG tablet Take 30 mg  by mouth every 4 (four) hours as needed for congestion.     DM-Phenylephrine-Acetaminophen (VICKS DAYQUIL COLD & FLU PO) Take 1 capsule by mouth daily.     azithromycin (ZITHROMAX) 250 MG tablet Take 2 tablets (500 mg) on  Day 1,  followed by 1 tablet (250 mg) once daily on Days 2 through 5. (Patient not taking: Reported on 07/02/2023) 6 each 0   OVER THE COUNTER MEDICATION [employer] Brand Allergy Medication. 1 tablet daily (Patient not taking:  Reported on 02/12/2023)     predniSONE (DELTASONE) 20 MG tablet Take 1 tablet (20 mg total) by mouth daily. (Patient not taking: Reported on 07/02/2023) 7 tablet 0   No facility-administered medications prior to visit.   Review of Systems  Review of Systems  Constitutional: Negative.   HENT:  Positive for postnasal drip.   Respiratory:  Positive for cough.   Cardiovascular: Negative.    Physical Exam  BP 126/84 (BP Location: Right Arm, Cuff Size: Normal)   Pulse 83   Temp 97.8 F (36.6 C)   Ht 5\' 4"  (1.626 m)   Wt 125 lb (56.7 kg)   SpO2 94%   BMI 21.46 kg/m  Physical Exam Constitutional:      Appearance: Normal appearance.  HENT:     Head: Normocephalic and atraumatic.  Cardiovascular:     Rate and Rhythm: Normal rate and regular rhythm.  Pulmonary:     Effort: Pulmonary effort is normal.     Breath sounds: Normal breath sounds. No wheezing, rhonchi or rales.  Neurological:     General: No focal deficit present.     Mental Status: She is alert and oriented to person, place, and time. Mental status is at baseline.      Lab Results:  CBC    Component Value Date/Time   WBC 6.5 03/06/2023 1123   RBC 5.04 03/06/2023 1123   HGB 14.5 03/06/2023 1123   HGB 14.7 09/04/2014 1811   HCT 45.4 03/06/2023 1123   HCT 46.2 09/04/2014 1811   PLT 180 03/06/2023 1123   PLT 194 09/04/2014 1811   MCV 90.1 03/06/2023 1123   MCV 87 09/04/2014 1811   MCH 28.8 03/06/2023 1123   MCHC 31.9 03/06/2023 1123   RDW 12.8 03/06/2023 1123   RDW 14.4 09/04/2014 1811   LYMPHSABS 2.4 03/06/2023 1123   LYMPHSABS 1.4 09/04/2014 1811   MONOABS 0.4 03/06/2023 1123   MONOABS 0.3 09/04/2014 1811   EOSABS 0.1 03/06/2023 1123   EOSABS 0.0 09/04/2014 1811   BASOSABS 0.1 03/06/2023 1123   BASOSABS 0.0 09/04/2014 1811    BMET    Component Value Date/Time   NA 139 03/06/2023 1123   NA 141 09/04/2014 1811   K 3.1 (L) 03/06/2023 1123   K 4.0 09/04/2014 1811   CL 103 03/06/2023 1123   CL 105  09/04/2014 1811   CO2 29 03/06/2023 1123   CO2 31 09/04/2014 1811   GLUCOSE 91 03/06/2023 1123   GLUCOSE 126 (H) 09/04/2014 1811   BUN 17 03/06/2023 1123   BUN 14 09/04/2014 1811   CREATININE 0.76 03/06/2023 1123   CREATININE 0.70 09/04/2014 1811   CALCIUM 8.9 03/06/2023 1123   CALCIUM 9.1 09/04/2014 1811   GFRNONAA >60 03/06/2023 1123   GFRNONAA >60 09/04/2014 1811   GFRNONAA >60 04/11/2014 0659   GFRAA >60 12/08/2019 1732   GFRAA >60 09/04/2014 1811   GFRAA >60 04/11/2014 0659    BNP    Component Value Date/Time  BNP 90.2 09/10/2022 1223    ProBNP No results found for: "PROBNP"  Imaging: No results found.   Assessment & Plan:   1. Chronic obstructive pulmonary disease, unspecified COPD type (HCC)  2. Chronic respiratory failure with hypoxia (HCC)  3. Seasonal allergic rhinitis, unspecified trigger  4. Lung nodule      COPD and Emphysema - Stable on Bevespi and as needed Ventolin HFA or ipratropium albuterol nebulizers.  - Recent exacerbation treated with Z-Pak, prednisone - No changes, continue current regimen.   Allergic Rhinitis Reports postnasal drip and frequent throat clearing. Currently using pseudoephedrine, which can raise blood pressure. -Discontinue daily use of pseudoephedrine. -Start over-the-counter antihistamine such as Claritin (loratadine) and mucinex 600mg  twice daily as needed for congestion   Chronic Respiratory Failure On nocturnal oxygen therapy. Oxygen saturation usually in the 90s. -Continue current oxygen therapy. -Use oxygen as needed when saturation drops below 88%.  Lung Nodule Calcified left upper lobe pulmonary nodule measuring 1.30mm, stable on last CT scan. -Schedule follow-up CT scan in January for lung cancer screening.  General Health Maintenance -Declined flu shot. -Discussed RSV vaccine, patient to discuss with son. -Continue monitoring blood pressure if using pseudoephedrine.      Recommendations: - Continue  Bevespi two puffs twice daily - Use Ventolin HFA or Ipratropium-albuterol nebulizer every 6 hours for breakthrough sob/wheezing  - Start over the counter antihistamine such a Claritin (RX Sent) - Take regular mucinex 600mg  twice a day as needed to loosen congestion (RX Sent) - Due for lung dose CT chest in January 2025 with lung cancer screening program  - Consider getting RSV vaccine which is a one time vaccine to prevent respiratory syncytial virus which can cause lower respiratory infections   Follow-up 6 months with Dr. Belia Heman or sooner if needed   Glenford Bayley, NP 07/02/2023

## 2023-07-02 NOTE — Patient Instructions (Addendum)
Recommendations: - Continue Bevespi two puffs twice daily - Use Ventolin HFA or Ipratropium-albuterol nebulizer every 6 hours for breakthrough sob/wheezing  - Start over the counter antihistamine such a Claritin (RX Sent) - Take regular mucinex 600mg  twice a day as needed to loosen congestion (RX Sent) - Due for lung dose CT chest in January 2025 with lung cancer screening program  - Consider getting RSV vaccine which is a one time vaccine to prevent respiratory syncytial virus which can cause lower respiratory infections   Follow-up 6 months with Dr. Belia Heman or sooner if needed    Respiratory Syncytial Virus (RSV) Vaccine Injection What is this medication? RESPIRATORY SYNCYTIAL VIRUS VACCINE (reh SPIR uh tor ee sin SISH uhl VY rus vak SEEN) reduces the risk of respiratory syncytial virus (RSV). It does not treat RSV. It is still possible to get RSV after receiving this vaccine, but the symptoms may be less severe or not last as long. It works by helping your immune system learn how to fight off a future infection. This medicine may be used for other purposes; ask your health care provider or pharmacist if you have questions. COMMON BRAND NAME(S): ABRYSVO, AREXVY What should I tell my care team before I take this medication? They need to know if you have any of these conditions: Immune system problems An unusual or allergic reaction to respiratory syncytial virus vaccine, other medications, foods, dyes, or preservatives Pregnant or trying to get pregnant Breastfeeding How should I use this medication? This vaccine is injected into a muscle. It is given by your care team. A copy of Vaccine Information Statements will be given before each vaccination. Be sure to read this information carefully each time. This sheet may change often. A copy of Vaccine Information Statements will be given before each vaccination. Be sure to read this information carefully each time. This sheet may change  often. Talk to your care team about the use of this vaccine in children. It is not approved for use in children. Overdosage: If you think you have taken too much of this medicine contact a poison control center or emergency room at once. NOTE: This medicine is only for you. Do not share this medicine with others. What if I miss a dose? This does not apply. What may interact with this medication? Medications that lower your chance of fighting infection This list may not describe all possible interactions. Give your health care provider a list of all the medicines, herbs, non-prescription drugs, or dietary supplements you use. Also tell them if you smoke, drink alcohol, or use illegal drugs. Some items may interact with your medicine. What should I watch for while using this medication? Visit your care team for regular health checks. Before you receive this vaccine, talk to your care team if you have an acute illness. Vaccines can be given to people with mild acute illness, such as the common cold or diarrhea. Discuss with your care team the risks and benefits of receiving this vaccine during a moderate to severe illness. Your care team may choose to wait to give you the vaccine when you feel better. Report any side effects to your care team or to the Vaccine Adverse Event Reporting System (VAERS) website at https://vaers.LAgents.no. This is only for reporting side effects; VAERs staff do not give medical advice. What side effects may I notice from receiving this medication? Side effects that you should report to your care team as soon as possible: Allergic reactions--skin rash, itching, hives, swelling  of the face, lips, tongue, or throat Side effects that usually do not require medical attention (report these to your care team if they continue or are bothersome): Fatigue Feeling faint or lightheaded Headache Joint pain Muscle pain Pain, redness, or irritation at injection site This list may not  describe all possible side effects. Call your doctor for medical advice about side effects. You may report side effects to FDA at 1-800-FDA-1088. Where should I keep my medication? This vaccine is only given by your care team. It will not be stored at home. NOTE: This sheet is a summary. It may not cover all possible information. If you have questions about this medicine, talk to your doctor, pharmacist, or health care provider.  2024 Elsevier/Gold Standard (2022-08-20 00:00:00)

## 2023-07-22 ENCOUNTER — Ambulatory Visit: Payer: Medicare Other | Admitting: Family Medicine

## 2023-07-22 ENCOUNTER — Telehealth: Payer: Self-pay | Admitting: Internal Medicine

## 2023-07-22 MED ORDER — PREDNISONE 20 MG PO TABS: 20.0000 mg | ORAL_TABLET | Freq: Every day | ORAL | 0 refills | Status: AC

## 2023-07-22 MED ORDER — AZITHROMYCIN 250 MG PO TABS: ORAL_TABLET | ORAL | 0 refills | Status: AC

## 2023-07-22 NOTE — Telephone Encounter (Signed)
How long have your symptoms been going on for? Yesterday. Had diarrhea 3 days ago.  Any fevers, chills or sweats? No fever. Chills. Any cough? If so are you getting anything up? What color? No cough Any SOB? yes Any wheezing? yes Do you monitor your oxygen at home? Pulse ox reading 94 Do you wear O2? Yes. 2 L at night.  What medications do you take? Albuterol, helps with chest tightness and wheezing.  Covid test? Will test. And call back.

## 2023-07-22 NOTE — Telephone Encounter (Signed)
Medications sent to [facility]. Left voicemail advising patient.

## 2023-07-22 NOTE — Telephone Encounter (Signed)
Patient has a wheeze and would like prednisone . She previously had shortness of breath and chills.  Pharmacy [facility] [address]

## 2023-07-22 NOTE — Telephone Encounter (Signed)
Patient reports negative COVID test.  [facility] [place].   Please advise.

## 2023-08-30 ENCOUNTER — Telehealth: Payer: Self-pay | Admitting: Internal Medicine

## 2023-08-30 DIAGNOSIS — J449 Chronic obstructive pulmonary disease, unspecified: Secondary | ICD-10-CM

## 2023-08-30 MED ORDER — ALBUTEROL SULFATE HFA 108 (90 BASE) MCG/ACT IN AERS: 2.0000 | INHALATION_SPRAY | Freq: Four times a day (QID) | RESPIRATORY_TRACT | 5 refills | Status: DC | PRN

## 2023-08-30 MED ORDER — BEVESPI AEROSPHERE 9-4.8 MCG/ACT IN AERO
INHALATION_SPRAY | RESPIRATORY_TRACT | 5 refills | Status: DC
Start: 2023-08-30 — End: 2024-04-18

## 2023-08-30 NOTE — Telephone Encounter (Signed)
Left message on voicemail advising that medications have been sent to [facility].

## 2023-08-30 NOTE — Telephone Encounter (Signed)
Patient states needs refill for Ventolin and Bevespi inhalers. Pharmacy is [facility]. Patient phone number is [phone].

## 2023-09-16 ENCOUNTER — Ambulatory Visit: Payer: Medicare Other

## 2023-09-23 ENCOUNTER — Emergency Department
Admission: EM | Admit: 2023-09-23 | Discharge: 2023-09-23 | Disposition: A | Discharge status: Home / Self Care | Payer: Medicare Other | Attending: Emergency Medicine | Admitting: Emergency Medicine

## 2023-09-23 ENCOUNTER — Encounter: Payer: Self-pay | Admitting: Emergency Medicine

## 2023-09-23 ENCOUNTER — Other Ambulatory Visit: Payer: Self-pay

## 2023-09-23 ENCOUNTER — Emergency Department: Payer: Medicare Other

## 2023-09-23 DIAGNOSIS — M545 Low back pain, unspecified: Secondary | ICD-10-CM | POA: Insufficient documentation

## 2023-09-23 DIAGNOSIS — Z85828 Personal history of other malignant neoplasm of skin: Secondary | ICD-10-CM | POA: Insufficient documentation

## 2023-09-23 DIAGNOSIS — R0789 Other chest pain: Secondary | ICD-10-CM | POA: Insufficient documentation

## 2023-09-23 DIAGNOSIS — J449 Chronic obstructive pulmonary disease, unspecified: Secondary | ICD-10-CM | POA: Diagnosis not present

## 2023-09-23 DIAGNOSIS — R0602 Shortness of breath: Secondary | ICD-10-CM | POA: Insufficient documentation

## 2023-09-23 LAB — URINALYSIS, ROUTINE W REFLEX MICROSCOPIC
Bacteria, UA: NONE SEEN
Bilirubin Urine: NEGATIVE
Glucose, UA: NEGATIVE mg/dL
Ketones, ur: NEGATIVE mg/dL
Leukocytes,Ua: NEGATIVE
Nitrite: NEGATIVE
Protein, ur: NEGATIVE mg/dL
Specific Gravity, Urine: 1.008 (ref 1.005–1.030)
Squamous Epithelial / HPF: 0 /[HPF] (ref 0–5)
pH: 5 (ref 5.0–8.0)

## 2023-09-23 LAB — BASIC METABOLIC PANEL
Anion gap: 9 (ref 5–15)
BUN: 19 mg/dL (ref 8–23)
CO2: 25 mmol/L (ref 22–32)
Calcium: 8.8 mg/dL — ABNORMAL LOW (ref 8.9–10.3)
Chloride: 103 mmol/L (ref 98–111)
Creatinine, Ser: 0.81 mg/dL (ref 0.44–1.00)
GFR, Estimated: >60 mL/min (ref >60)
Glucose, Bld: 105 mg/dL — ABNORMAL HIGH (ref 70–99)
Potassium: 3.8 mmol/L (ref 3.5–5.1)
Sodium: 137 mmol/L (ref 135–145)

## 2023-09-23 LAB — CBC
HCT: 41.4 % (ref 36.0–46.0)
Hemoglobin: 13.4 g/dL (ref 12.0–15.0)
MCH: 29.6 pg (ref 26.0–34.0)
MCHC: 32.4 g/dL (ref 30.0–36.0)
MCV: 91.6 fL (ref 80.0–100.0)
Platelets: 169 10*3/uL (ref 150–400)
RBC: 4.52 MIL/uL (ref 3.87–5.11)
RDW: 12.6 % (ref 11.5–15.5)
WBC: 6.3 10*3/uL (ref 4.0–10.5)
nRBC: 0 % (ref 0.0–0.2)

## 2023-09-23 LAB — TROPONIN I (HIGH SENSITIVITY)
Troponin I (High Sensitivity): 5 ng/L (ref <18)
Troponin I (High Sensitivity): 5 ng/L (ref <18)

## 2023-09-23 MED ORDER — ACETAMINOPHEN 500 MG PO TABS
1000.0000 mg | ORAL_TABLET | Freq: Once | ORAL | Status: AC
  Administered 2023-09-23: 1000 mg via ORAL
  Filled 2023-09-23: qty 2

## 2023-09-23 MED ORDER — NITROGLYCERIN 0.4 MG SL SUBL
SUBLINGUAL_TABLET | SUBLINGUAL | Status: AC
  Filled 2023-09-23: qty 3

## 2023-09-23 MED ORDER — LIDOCAINE 5 % EX PTCH
1.0000 | MEDICATED_PATCH | Freq: Once | CUTANEOUS | Status: DC
Start: 2023-09-23 — End: 2023-09-23
  Administered 2023-09-23: 1 via TRANSDERMAL
  Filled 2023-09-23: qty 1

## 2023-09-23 MED ORDER — LIDOCAINE 5 % EX PTCH: 1.0000 | MEDICATED_PATCH | CUTANEOUS | 0 refills | Status: DC

## 2023-09-23 NOTE — ED Triage Notes (Signed)
EMS brings pt in from home for c/o back pain, left arm tingling, leg cramping

## 2023-09-23 NOTE — ED Provider Notes (Signed)
[facility] Provider Note    Event Date/Time   First MD Initiated Contact with Patient 09/23/23 0300     (approximate)   History   Chest Pain   HPI  Kelli Phillips is a 73 y.o. female with history of COPD who presents to the emergency department with multiple complaints.  Patient states tonight she had an episode of central chest soreness with pain that radiated down the left arm and felt short of breath.  The symptoms have resolved other than some very minimal central chest soreness that is worse with palpation.  No wheezing, fever or cough, lower extremity swelling or pain.  No history of PE or DVT.  No history of CAD.  She also complains of left lower back pain worse with movement and bending over.  No numbness, tingling or weakness.  No injury that she can recall.  No previous back surgery.  No bladder or bowel incontinence.  Patient also complaining of dark urine in the mornings.  She is requesting a urine sample be obtained.  She denies any dysuria or hematuria.  No flank pain.  No history of kidney stone.   History provided by patient.    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 Authorizing Provider  albuterol (VENTOLIN HFA) 108 (90 Base) MCG/ACT inhaler Inhale 2 puffs into the lungs every 6 (six) hours as needed for wheezing or shortness of breath. 08/30/23   Erin Fulling, MD  furosemide (LASIX) 40 MG tablet Take 0.5 tablets (20 mg total) by mouth daily as needed for fluid or edema. And additional 20mg  as needed 10/19/22   Clarisa Kindred A, FNP  Glycopyrrolate-Formoterol (BEVESPI AEROSPHERE) 9-4.8 MCG/ACT AERO INHALE 2 PUFFS INTO THE LUNGS TWICE A DAY 08/30/23   Erin Fulling, MD  guaiFENesin (MUCINEX) 600 MG 12 hr tablet Take 1 tablet (600 mg total) by mouth 2 (two) times daily as needed for cough or to loosen phlegm. 07/02/23   Glenford Bayley, NP  ipratropium-albuterol (DUONEB) 0.5-2.5 (3) MG/3ML SOLN Take 3 mLs by nebulization every 6 (six) hours as needed. 10/03/21   Darlin Priestly, MD  loratadine (CLARITIN) 10 MG tablet Take 1 tablet (10 mg total) by mouth daily. 07/02/23   Glenford Bayley, NP  ondansetron (ZOFRAN) 4 MG tablet Take 1 tablet (4 mg total) by mouth every 8 (eight) hours as needed for nausea or vomiting. 02/16/23 02/16/24  Parrett, Virgel Bouquet, NP  OVER THE COUNTER MEDICATION [employer] Brand Allergy Medication. 1 tablet daily Patient not taking: Reported on 02/12/2023    [provider]  pseudoephedrine (SUDAFED) 30 MG tablet Take 30 mg by mouth every 4 (four) hours as needed for congestion.    [provider]    Physical Exam   Triage Vital Signs: ED Triage Vitals  Encounter Vitals Group     BP 09/23/23 0203 (!) 156/88     Systolic BP Percentile --      Diastolic BP Percentile --      Pulse Rate 09/23/23 0203 87     Resp 09/23/23 0203 20     Temp 09/23/23 0205 97.8 F (36.6 C)  Temp Source 09/23/23 0205 Oral     SpO2 09/23/23 0203 97 %     Weight 09/23/23 0203 122 lb (55.3 kg)     Height 09/23/23 0203 5\' 4"  (1.626 m)     Head Circumference --      Peak Flow --      Pain Score 09/23/23 0203 6     Pain Loc --      Pain Education --      Exclude from Growth Chart --     Most recent vital signs: Vitals:   09/23/23 0205 09/23/23 0545  BP:  138/86  Pulse:  79  Resp:  18  Temp: 97.8 F (36.6 C)   SpO2:  95%    CONSTITUTIONAL: Alert, responds appropriately to questions.  Elderly, no distress, pleasant HEAD: Normocephalic, atraumatic EYES: Conjunctivae clear, pupils appear equal, sclera nonicteric ENT: normal nose; moist mucous membranes NECK: Supple, normal ROM CARD: RRR; S1 and S2 appreciated CHEST:  Chest  wall is tender to palpation over the center of her sternum.  No crepitus, ecchymosis, erythema, warmth, rash or other lesions present.   RESP: Normal chest excursion without splinting or tachypnea; breath sounds clear and equal bilaterally; no wheezes, no rhonchi, no rales, no hypoxia or respiratory distress, speaking full sentences ABD/GI: Non-distended; soft, non-tender, no rebound, no guarding, no peritoneal signs, no CVA tenderness BACK: The back appears normal, tender to palpation over the left lower lumbar paraspinal muscles without overlying skin changes.  No midline spinal tenderness, step-off or deformity. EXT: Normal ROM in all joints; no deformity noted, no edema.  No calf tenderness or calf swelling. SKIN: Normal color for age and race; warm; no rash on exposed skin NEURO: Moves all extremities equally, normal speech, normal sensation diffusely, no saddle anesthesia, no hyperreflexia or clonus, ambulates with normal gait PSYCH: The patient's mood and manner are appropriate.   ED Results / Procedures / Treatments   LABS: (all labs ordered are listed, but only abnormal results are displayed) Labs Reviewed  BASIC METABOLIC PANEL - Abnormal; Notable for the following components:      Result Value   Glucose, Bld 105 (*)    Calcium 8.8 (*)    All other components within normal limits  URINALYSIS, ROUTINE W REFLEX MICROSCOPIC - Abnormal; Notable for the following components:   Color, Urine STRAW (*)    APPearance CLEAR (*)    Hgb urine dipstick SMALL (*)    All other components within normal limits  CBC  TROPONIN I (HIGH SENSITIVITY)  TROPONIN I (HIGH SENSITIVITY)     EKG:  EKG Interpretation Date/Time:  Thursday September 23 2023 02:03:02 EST Ventricular Rate:  78 PR Interval:  148 QRS Duration:  72 QT Interval:  388 QTC Calculation: 442 R Axis:   78  Text Interpretation: Normal sinus rhythm Septal infarct , age undetermined Abnormal ECG When compared with ECG of  10-Sep-2022 14:33, Sinus rhythm has replaced Junctional rhythm Nonspecific T wave abnormality no longer evident in Inferior leads QT has lengthened Confirmed by Rochele Raring [phone]) on 09/23/2023 2:48:53 AM         RADIOLOGY: My personal review and interpretation of imaging: Chest x-ray clear.  I have personally reviewed all radiology reports.   DG Chest 2 View Result Date: 09/23/2023 CLINICAL DATA:  Back pain, left arm tingling, chest pain EXAM: CHEST - 2 VIEW COMPARISON:  09/14/2022 FINDINGS: Stable cardiomediastinal silhouette. Hyperinflation. No focal consolidation, pleural effusion, or pneumothorax. No displaced rib fractures. IMPRESSION: No  acute cardiopulmonary process.  Emphysema. Electronically Signed   By: Minerva Fester M.D.   On: 09/23/2023 02:31     PROCEDURES:  Critical Care performed: No      Procedures    IMPRESSION / MDM / ASSESSMENT AND PLAN / ED COURSE  I reviewed the triage vital signs and the nursing notes.    Patient here with chest pain, lower back pain, dark urine.  The patient is on the cardiac monitor to evaluate for evidence of arrhythmia and/or significant heart rate changes.   DIFFERENTIAL DIAGNOSIS (includes but not limited to):   ACS, COPD, chest wall pain, doubt pneumonia, CHF, PE, dissection  Suspect back pain secondary to lumbar strain, spasm.  No red flag symptoms to suggest cauda equina, epidural abscess or hematoma, discitis or osteomyelitis.  No midline tenderness to suggest fracture.  Doubt kidney stone.  Doubt UTI or pyelonephritis.  Doubt spinal cord impingement from lumbar stenosis.  Suspect patient's urine being dark in the morning is secondary to urine being concentrated when she first wakes up.  She is not having urinary symptoms.  This does not sound like it is from blood.   Patient's presentation is most consistent with acute presentation with potential threat to life or bodily function.   PLAN: Will obtain cardiac labs,  chest x-ray, urine.  Will give Tylenol, Lidoderm patch for discomfort.  No indication for emergent back imaging.  Lungs currently clear to auscultation without increased work of breathing or hypoxia.   MEDICATIONS GIVEN IN ED: Medications  lidocaine (LIDODERM) 5 % 1 patch (1 patch Transdermal Patch Applied 09/23/23 0326)  nitroGLYCERIN (NITROSTAT) 0.4 MG SL tablet (  Not Given 09/23/23 0542)  acetaminophen (TYLENOL) tablet 1,000 mg (1,000 mg Oral Given 09/23/23 0327)     ED COURSE: Patient's labs show normal hemoglobin.  Normal electrolytes and creatinine.  Urine shows no sign of infection, dehydration or blood.  Troponin x 2 negative.  Chest x-ray reviewed and interpreted by myself and the radiologist and is clear.  She reports feeling better.  I feel she is safe to be discharged home.  Recommended Tylenol as needed, Lidoderm patches as needed.  Recommended follow-up with her PCP and cardiologist.  Patient verbalized understanding and is comfortable with this plan.   At this time, I do not feel there is any life-threatening condition present. I reviewed all nursing notes, vitals, pertinent previous records.  All lab and urine results, EKGs, imaging ordered have been independently reviewed and interpreted by myself.  I reviewed all available radiology reports from any imaging ordered this visit.  Based on my assessment, I feel the patient is safe to be discharged home without further emergent workup and can continue workup as an outpatient as needed. Discussed all findings, treatment plan as well as usual and customary return precautions.  They verbalize understanding and are comfortable with this plan.  Outpatient follow-up has been provided as needed.  All questions have been answered.    CONSULTS:  none   OUTSIDE RECORDS REVIEWED: Reviewed previous pulmonology and cardiology notes.       FINAL CLINICAL IMPRESSION(S) / ED DIAGNOSES   Final diagnoses:  Atypical chest pain  Acute  left-sided low back pain without sciatica     Rx / DC Orders   ED Discharge Orders          Ordered    lidocaine (LIDODERM) 5 %  Every 24 hours        09/23/23 0539  Note:  This document was prepared using Dragon voice recognition software and may include unintentional dictation errors.   Coralyn Roselli, Layla Maw, DO 09/23/23 [phone]
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
## 2023-09-23 NOTE — ED Notes (Signed)
Pt ambulatory to hallway restroom and returned to ed 42 short of breath. Spo2 read at 84. This nt placed pt on 1 liter of O2. Nurse notified

## 2023-09-23 NOTE — Discharge Instructions (Signed)
You may take Tylenol 1000 mg every 6 hours as needed for pain. °

## 2023-09-24 ENCOUNTER — Telehealth: Payer: Self-pay | Admitting: Family

## 2023-09-24 NOTE — Telephone Encounter (Signed)
Was sent information from EDP that saw patient in the ED 09/23/23 with recommendation for me to see her. Reached out to EDP as it didn't appear (based on record) that patient was having any issues with heart failure or fluid overload. EDP confirmed that patient was not having HF exacerbation and needed to follow-up with PCP and not at the HF clinic.

## 2023-10-11 ENCOUNTER — Encounter: Payer: Self-pay | Admitting: Family Medicine

## 2023-10-11 ENCOUNTER — Ambulatory Visit (INDEPENDENT_AMBULATORY_CARE_PROVIDER_SITE_OTHER): Payer: Medicare Other | Admitting: Family Medicine

## 2023-10-11 VITALS — BP 130/70 | HR 74 | Temp 98.0°F | Resp 18 | Ht 64.0 in | Wt 120.0 lb

## 2023-10-11 DIAGNOSIS — J309 Allergic rhinitis, unspecified: Secondary | ICD-10-CM | POA: Diagnosis not present

## 2023-10-11 DIAGNOSIS — D126 Benign neoplasm of colon, unspecified: Secondary | ICD-10-CM

## 2023-10-11 DIAGNOSIS — R7309 Other abnormal glucose: Secondary | ICD-10-CM

## 2023-10-11 DIAGNOSIS — Z780 Asymptomatic menopausal state: Secondary | ICD-10-CM

## 2023-10-11 DIAGNOSIS — E559 Vitamin D deficiency, unspecified: Secondary | ICD-10-CM

## 2023-10-11 DIAGNOSIS — D649 Anemia, unspecified: Secondary | ICD-10-CM

## 2023-10-11 DIAGNOSIS — Z1211 Encounter for screening for malignant neoplasm of colon: Secondary | ICD-10-CM

## 2023-10-11 DIAGNOSIS — E538 Deficiency of other specified B group vitamins: Secondary | ICD-10-CM | POA: Diagnosis not present

## 2023-10-11 DIAGNOSIS — J432 Centrilobular emphysema: Secondary | ICD-10-CM

## 2023-10-11 DIAGNOSIS — J449 Chronic obstructive pulmonary disease, unspecified: Secondary | ICD-10-CM | POA: Diagnosis not present

## 2023-10-11 DIAGNOSIS — Z1159 Encounter for screening for other viral diseases: Secondary | ICD-10-CM

## 2023-10-11 DIAGNOSIS — Z1231 Encounter for screening mammogram for malignant neoplasm of breast: Secondary | ICD-10-CM

## 2023-10-11 NOTE — Progress Notes (Signed)
SUBJECTIVE:   Chief Complaint  Patient presents with   Establish Care   HPI Presents to clinic to establish care  Discussed the use of AI scribe software for clinical note transcription with the patient, who gave verbal consent to proceed.  History of Present Illness Kelli Phillips is a 73 year old female with COPD who presents for a new patient visit.  She has a history of COPD and uses two inhalers: albuterol and Brexby. She quit smoking over ten years ago and follows with a pulmonologist at [facility] clinic. She undergoes regular lung cancer screenings, although she needs to reschedule her next one due to a recent illness. No chest pain, shortness of breath, or swelling in her legs.  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 She follows with a cardiologist, a nurse practitioner, at the hospital.  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 The initial surgery was complicated, requiring a second surgery to remove a colostomy bag. She cannot undergo traditional colonoscopy due to surgical complications and instead has x-rays and ultrasounds for monitoring.  She experiences sinus issues and takes Advil for sinus relief. She reports a broken nose that affects her nasal passages.  She has experienced anxiety attacks, particularly during a stressful incident involving a neighbor. The anxiety causes difficulty breathing and a tingling sensation.  She lives with her son and she has a good relationship. She previously worked in [employer] and at [employer].      PERTINENT PMH / PSH: As above  OBJECTIVE:  BP 130/70   Pulse 74   Temp 98 F (36.7 C) (Oral)   Resp 18   Ht 5\' 4"  (1.626 m)   Wt 120 lb (54.4 kg)   SpO2 98%   BMI 20.60 kg/m    Physical Exam Vitals reviewed.  Constitutional:      General: She is not in acute distress.    Appearance: She is  not ill-appearing.  HENT:     Head: Normocephalic.     Right Ear: Tympanic membrane, ear canal and external ear normal.     Left Ear: Tympanic membrane, ear canal and external ear normal.     Nose: Nose normal.     Mouth/Throat:     Mouth: Mucous membranes are moist.  Eyes:     Extraocular Movements: Extraocular movements intact.     Conjunctiva/sclera: Conjunctivae normal.     Pupils: Pupils are equal, round, and reactive to light.  Neck:     Thyroid: No thyromegaly or thyroid tenderness.     Vascular: No carotid bruit.  Cardiovascular:     Rate and Rhythm: Normal rate and regular rhythm.     Pulses: Normal pulses.     Heart sounds: Normal heart sounds.  Pulmonary:     Effort: Pulmonary effort is normal.     Breath sounds: Normal breath sounds.  Abdominal:     General: Bowel sounds are normal. There is no distension.     Palpations: Abdomen is soft.     Tenderness: There is no abdominal tenderness. There is no right CVA tenderness, left CVA tenderness, guarding or rebound.  Musculoskeletal:        General: Normal range of motion.     Cervical back: Normal range of motion.     Right lower leg: No edema.     Left lower  leg: No edema.  Lymphadenopathy:     Cervical: No cervical adenopathy.  Skin:    Capillary Refill: Capillary refill takes less than 2 seconds.  Neurological:     General: No focal deficit present.     Mental Status: She is alert and oriented to person, place, and time. Mental status is at baseline.     Motor: No weakness.  Psychiatric:        Mood and Affect: Mood normal.        Behavior: Behavior normal.        Thought Content: Thought content normal.        Judgment: Judgment normal.           10/11/2023    2:21 PM  Depression screen PHQ 2/9  Decreased Interest 0  Down, Depressed, Hopeless 1  PHQ - 2 Score 1  Altered sleeping 0  Tired, decreased energy 0  Change in appetite 1  Feeling bad or failure about yourself  0  Trouble concentrating 0   Moving slowly or fidgety/restless 0  Suicidal thoughts 0  PHQ-9 Score 2  Difficult doing work/chores Not difficult at all      10/11/2023    2:23 PM  GAD 7 : Generalized Anxiety Score  Nervous, Anxious, on Edge 1  Control/stop worrying 0  Worry too much - different things 0  Trouble relaxing 0  Restless 0  Easily annoyed or irritable 0  Afraid - awful might happen 2  Total GAD 7 Score 3  Anxiety Difficulty Not difficult at all    ASSESSMENT/PLAN:  COPD, very severe (HCC) Assessment & Plan: Stable on Albuterol and Brexby inhalers. Follows with pulmonologist Dr. Berton Lan at [place]. No current smoking, quit 10+ years ago. Lung cancer screening due. -Continue current inhalers. -Annual lung cancer screening.   Centrilobular emphysema (HCC) Assessment & Plan: Follows with Pulmonology   Allergic rhinitis, unspecified seasonality, unspecified trigger Assessment & Plan: Reports sinus drainage, discomfort with nasal sprays. -Try over-the-counter Allegra for sinus symptoms.   Colon cancer screening -     Ambulatory referral to Gastroenterology  Breast cancer screening by mammogram -     3D Screening Mammogram, Left and Right; Future  Postmenopausal estrogen deficiency -     DG Bone Density; Future  Need for hepatitis C screening test -     Hepatitis C antibody  Vitamin D deficiency -     VITAMIN D 25 Hydroxy (Vit-D Deficiency, Fractures)  Vitamin B 12 deficiency -     Vitamin B12  Anemia, unspecified type -     CBC with Differential/Platelet  Abnormal glucose -     Hemoglobin A1c  Adenomatous polyp of colon, unspecified part of colon Assessment & Plan: Half of colon removed due to large tumor. No current GI follow-up. Last colon screening [address]81, recommended follow up 1 year.  Patient lost to follow up -Referral for colonoscopy       PDMP reviewed  Return if symptoms worsen or fail to improve, for PCP.  Dana Allan, MD

## 2023-10-11 NOTE — Patient Instructions (Addendum)
 It was a pleasure meeting you today. Thank you for allowing me to take part in your health care.  Our goals for today as we discussed include:  We will get some labs today.  If they are abnormal or we need to do something about them, I will call you.  If they are normal, I will send you a message on MyChart (if it is active) or a letter in the mail.  If you don't hear from us  in 2 weeks, please call the office at the number below.    Referral sent for Mammogram and Dexa scan. Please call to schedule appointment. [facility] [address] [place], [place] [address] [phone]    Referral sent to GI for colonoscopy   This is a list of the screening recommended for you and due dates:  Health Maintenance  Topic Date Due   Medicare Annual Wellness Visit  Never done   Pneumonia Vaccine (1 of 2 - PCV) Never done   Hepatitis C Screening  Never done   DTaP/Tdap/Td vaccine (1 - Tdap) Never done   Colon Cancer Screening  Never done   Zoster (Shingles) Vaccine (1 of 2) Never done   DEXA scan (bone density measurement)  Never done   COVID-19 Vaccine (2 - 2024-25 season) 05/02/2023   Flu Shot  11/29/2023*   Mammogram  06/16/2024   HPV Vaccine  Aged Out   Screening for Lung Cancer  Discontinued  *Topic was postponed. The date shown is not the original due date.     If you have any questions or concerns, please do not hesitate to call the office at [phone].  I look forward to our next visit and until then take care and stay safe.  Regards,   Valli Gaw, MD   [facility]

## 2023-10-12 ENCOUNTER — Encounter: Payer: Self-pay | Admitting: Family Medicine

## 2023-10-12 DIAGNOSIS — E559 Vitamin D deficiency, unspecified: Secondary | ICD-10-CM | POA: Insufficient documentation

## 2023-10-12 DIAGNOSIS — E538 Deficiency of other specified B group vitamins: Secondary | ICD-10-CM | POA: Insufficient documentation

## 2023-10-12 DIAGNOSIS — Z1211 Encounter for screening for malignant neoplasm of colon: Secondary | ICD-10-CM | POA: Insufficient documentation

## 2023-10-12 DIAGNOSIS — Z780 Asymptomatic menopausal state: Secondary | ICD-10-CM | POA: Insufficient documentation

## 2023-10-12 DIAGNOSIS — Z1159 Encounter for screening for other viral diseases: Secondary | ICD-10-CM | POA: Insufficient documentation

## 2023-10-12 DIAGNOSIS — D649 Anemia, unspecified: Secondary | ICD-10-CM | POA: Insufficient documentation

## 2023-10-12 DIAGNOSIS — Z1231 Encounter for screening mammogram for malignant neoplasm of breast: Secondary | ICD-10-CM | POA: Insufficient documentation

## 2023-10-12 DIAGNOSIS — R7309 Other abnormal glucose: Secondary | ICD-10-CM | POA: Insufficient documentation

## 2023-10-12 LAB — CBC WITH DIFFERENTIAL/PLATELET
Basophils Absolute: 0 10*3/uL (ref 0.0–0.1)
Basophils Relative: 0.3 % (ref 0.0–3.0)
Eosinophils Absolute: 0.1 10*3/uL (ref 0.0–0.7)
Eosinophils Relative: 2.4 % (ref 0.0–5.0)
HCT: 42.7 % (ref 36.0–46.0)
Hemoglobin: 14 g/dL (ref 12.0–15.0)
Lymphocytes Relative: 31 % (ref 12.0–46.0)
Lymphs Abs: 1.6 10*3/uL (ref 0.7–4.0)
MCHC: 32.7 g/dL (ref 30.0–36.0)
MCV: 90.3 fL (ref 78.0–100.0)
Monocytes Absolute: 0.4 10*3/uL (ref 0.1–1.0)
Monocytes Relative: 7.1 % (ref 3.0–12.0)
Neutro Abs: 3.1 10*3/uL (ref 1.4–7.7)
Neutrophils Relative %: 59.2 % (ref 43.0–77.0)
Platelets: 180 10*3/uL (ref 150.0–400.0)
RBC: 4.73 Mil/uL (ref 3.87–5.11)
RDW: 13.5 % (ref 11.5–15.5)
WBC: 5.2 10*3/uL (ref 4.0–10.5)

## 2023-10-12 LAB — HEPATITIS C ANTIBODY: Hepatitis C Ab: NONREACTIVE

## 2023-10-12 LAB — HEMOGLOBIN A1C: Hgb A1c MFr Bld: 5.5 % (ref 4.6–6.5)

## 2023-10-12 LAB — VITAMIN D 25 HYDROXY (VIT D DEFICIENCY, FRACTURES): VITD: 9.03 ng/mL — ABNORMAL LOW (ref 30.00–100.00)

## 2023-10-12 LAB — VITAMIN B12: Vitamin B-12: 156 pg/mL — ABNORMAL LOW (ref 211–911)

## 2023-10-12 NOTE — Assessment & Plan Note (Signed)
Stable on Albuterol and Brexby inhalers. Follows with pulmonologist Dr. Berton Lan at [place]. No current smoking, quit 10+ years ago. Lung cancer screening due. -Continue current inhalers. -Annual lung cancer screening.

## 2023-10-12 NOTE — Assessment & Plan Note (Signed)
Follows with Pulmonology

## 2023-10-12 NOTE — Assessment & Plan Note (Signed)
Reports sinus drainage, discomfort with nasal sprays. -Try over-the-counter Allegra for sinus symptoms.

## 2023-10-12 NOTE — Assessment & Plan Note (Signed)
Half of colon removed due to large tumor. No current GI follow-up. Last colon screening [address], recommended follow up 1 year.  Patient lost to follow up -Referral for colonoscopy

## 2023-10-15 ENCOUNTER — Telehealth: Payer: Self-pay

## 2023-10-15 NOTE — Telephone Encounter (Signed)
Copied from CRM [id number]. Topic: Clinical - Lab/Test Results >> Oct 15, 2023  1:54 PM [name] wrote: Reason for CRM: Patient requesting phone call to go over lab results.

## 2023-10-18 ENCOUNTER — Other Ambulatory Visit: Payer: Self-pay

## 2023-10-18 ENCOUNTER — Other Ambulatory Visit: Payer: Self-pay | Admitting: Family Medicine

## 2023-10-18 DIAGNOSIS — E538 Deficiency of other specified B group vitamins: Secondary | ICD-10-CM

## 2023-10-18 DIAGNOSIS — E559 Vitamin D deficiency, unspecified: Secondary | ICD-10-CM

## 2023-10-18 DIAGNOSIS — Z87891 Personal history of nicotine dependence: Secondary | ICD-10-CM

## 2023-10-18 DIAGNOSIS — Z122 Encounter for screening for malignant neoplasm of respiratory organs: Secondary | ICD-10-CM

## 2023-10-18 MED ORDER — VITAMIN D (ERGOCALCIFEROL) 1.25 MG (50000 UNIT) PO CAPS: 50000.0000 [IU] | ORAL_CAPSULE | ORAL | 1 refills | Status: DC

## 2023-10-18 MED ORDER — VITAMIN B-12 1000 MCG PO TABS: 2000.0000 ug | ORAL_TABLET | Freq: Every day | ORAL | 3 refills | Status: AC

## 2023-10-19 ENCOUNTER — Ambulatory Visit
Admission: RE | Admit: 2023-10-19 | Discharge: 2023-10-19 | Disposition: A | Discharge status: Home / Self Care | Payer: Medicare Other | Source: Ambulatory Visit | Attending: Acute Care | Admitting: Acute Care

## 2023-10-19 ENCOUNTER — Telehealth: Payer: Self-pay

## 2023-10-19 DIAGNOSIS — Z122 Encounter for screening for malignant neoplasm of respiratory organs: Secondary | ICD-10-CM | POA: Diagnosis present

## 2023-10-19 DIAGNOSIS — Z87891 Personal history of nicotine dependence: Secondary | ICD-10-CM | POA: Insufficient documentation

## 2023-10-19 NOTE — Telephone Encounter (Signed)
Copied from CRM [id number]. Topic: General - Other >> Oct 19, 2023  9:33 AM Gurney Maxin H wrote: Reason for CRM: Patient wanted to inform provider that she has scheduled the CT scan on her own and having it done today.

## 2023-10-19 NOTE — Telephone Encounter (Signed)
 Called patient reviewed all information and repeated back to me. Will call if any questions.  ? ?

## 2023-10-21 ENCOUNTER — Encounter: Payer: Self-pay | Admitting: *Deleted

## 2023-10-27 ENCOUNTER — Other Ambulatory Visit: Payer: Self-pay

## 2023-10-27 DIAGNOSIS — Z87891 Personal history of nicotine dependence: Secondary | ICD-10-CM

## 2023-10-27 DIAGNOSIS — Z122 Encounter for screening for malignant neoplasm of respiratory organs: Secondary | ICD-10-CM

## 2023-11-02 ENCOUNTER — Telehealth: Payer: Self-pay | Admitting: Internal Medicine

## 2023-11-02 NOTE — Telephone Encounter (Signed)
 Patient states would like to go back on the Ventolin inhaler. Pharmacy is [facility]. Patient phone number is [phone].

## 2023-11-02 NOTE — Telephone Encounter (Signed)
 Patient advised that she has [facility]. Nothing further needed.

## 2023-11-14 ENCOUNTER — Emergency Department

## 2023-11-14 ENCOUNTER — Emergency Department
Admission: EM | Admit: 2023-11-14 | Discharge: 2023-11-14 | Disposition: A | Discharge status: Home / Self Care | Attending: Emergency Medicine | Admitting: Emergency Medicine

## 2023-11-14 ENCOUNTER — Other Ambulatory Visit: Payer: Self-pay

## 2023-11-14 DIAGNOSIS — S4991XA Unspecified injury of right shoulder and upper arm, initial encounter: Secondary | ICD-10-CM | POA: Diagnosis present

## 2023-11-14 DIAGNOSIS — W230XXA Caught, crushed, jammed, or pinched between moving objects, initial encounter: Secondary | ICD-10-CM | POA: Insufficient documentation

## 2023-11-14 DIAGNOSIS — J449 Chronic obstructive pulmonary disease, unspecified: Secondary | ICD-10-CM | POA: Insufficient documentation

## 2023-11-14 DIAGNOSIS — S5011XA Contusion of right forearm, initial encounter: Secondary | ICD-10-CM | POA: Insufficient documentation

## 2023-11-14 DIAGNOSIS — T148XXA Other injury of unspecified body region, initial encounter: Secondary | ICD-10-CM

## 2023-11-14 NOTE — ED Notes (Signed)
 Pt in NAD, ambulatory with steady with steady gait.

## 2023-11-14 NOTE — ED Triage Notes (Signed)
 Pt got her R arm caught in her sliding door last night. Pt was checked by EMS last night but requested them again today because swelling, pain, redness to R elbow is getting worse.

## 2023-11-14 NOTE — Discharge Instructions (Addendum)
 Use the compression bandage for the next week or until pain has improved.  Follow up with primary care if not improving over the week.

## 2023-11-14 NOTE — ED Provider Notes (Signed)
 [facility] Provider Note    Event Date/Time   First MD Initiated Contact with Patient 11/14/23 1604     (approximate)   History   Arm Injury   HPI  Kelli Phillips is a 73 y.o. female with history of COPD, anemia, arthritis and as listed in EMR presents to the emergency department for treatment and evaluation of pain in the right elbow last night.  The sliding glass door came off the check and she was trying to fix it but instead it fell toward. She put her arm up to keep it from hitting her face and it has been painful since. She can feel "fluid" in it.      Physical Exam   Triage Vital Signs: ED Triage Vitals [11/14/23 1543]  Encounter Vitals Group     BP (!) 118/100     Systolic BP Percentile      Diastolic BP Percentile      Pulse Rate 79     Resp 18     Temp 97.7 F (36.5 C)     Temp Source Oral     SpO2 91 %     Weight      Height      Head Circumference      Peak Flow      Pain Score 7     Pain Loc      Pain Education      Exclude from Growth Chart     Most recent vital signs: Vitals:   11/14/23 1543 11/14/23 1734  BP: (!) 118/100   Pulse: 79   Resp: 18 16  Temp: 97.7 F (36.5 C)   SpO2: 91%     General: Awake, no distress.  CV:  Good peripheral perfusion.  Resp:  Normal effort.  Abd:  No distention.  Other:  Hematoma to the right proximal forearm at elbow.   ED Results / Procedures / Treatments   Labs (all labs ordered are listed, but only abnormal results are displayed) Labs Reviewed - No data to display   EKG  Not indicated.   RADIOLOGY  Image and radiology report reviewed and interpreted by me. Radiology report consistent with the same.  No acute findings on image of the right elbow.  PROCEDURES:  Critical Care performed: No  Procedures   MEDICATIONS ORDERED IN ED:  Medications - No data to display   IMPRESSION / MDM / ASSESSMENT AND PLAN / ED COURSE   I have reviewed the triage  note.  Differential diagnosis includes, but is not limited to, fracture, olecranon bursitis, contusion, MSK pain.  Patient's presentation is most consistent with acute illness / injury with system symptoms.  73 year old female presenting to the emergency department for evaluation of the right forearm after her sliding glass door fell toward her last night and she put her arm up to block her face.  See HPI for further details.  On exam, she has a large hematoma to the forearm near the elbow.  She is able to perform full range of motion.  X-ray is negative for acute bony abnormality.  Plan will be to use a light compression bandage, take Tylenol, and have her apply ice 20 minutes off-and-on per hour.  ER return precautions discussed.      FINAL CLINICAL IMPRESSION(S) / ED DIAGNOSES   Final diagnoses:  Hematoma     Rx / DC Orders   ED Discharge Orders     None  Note:  This document was prepared using Dragon voice recognition software and may include unintentional dictation errors.   Chinita Pester, FNP 11/14/23 Gwynneth Aliment, MD 11/14/23 [phone]

## 2023-11-29 ENCOUNTER — Other Ambulatory Visit: Payer: Self-pay | Admitting: Internal Medicine

## 2023-11-29 ENCOUNTER — Other Ambulatory Visit: Payer: Self-pay

## 2023-11-29 DIAGNOSIS — J449 Chronic obstructive pulmonary disease, unspecified: Secondary | ICD-10-CM

## 2023-11-29 MED ORDER — ALBUTEROL SULFATE HFA 108 (90 BASE) MCG/ACT IN AERS
2.0000 | INHALATION_SPRAY | Freq: Four times a day (QID) | RESPIRATORY_TRACT | 5 refills | Status: DC | PRN
Start: 2023-11-29 — End: 2023-11-30

## 2023-11-29 MED ORDER — ALBUTEROL SULFATE HFA 108 (90 BASE) MCG/ACT IN AERS: 2.0000 | INHALATION_SPRAY | Freq: Four times a day (QID) | RESPIRATORY_TRACT | 5 refills | Status: DC | PRN

## 2023-11-30 MED ORDER — ALBUTEROL SULFATE HFA 108 (90 BASE) MCG/ACT IN AERS: 2.0000 | INHALATION_SPRAY | Freq: Four times a day (QID) | RESPIRATORY_TRACT | 5 refills | Status: DC | PRN

## 2023-11-30 NOTE — Addendum Note (Signed)
 Addended by: Bonney Leitz on: 11/30/2023 09:39 AM   Modules accepted: Orders

## 2023-12-02 ENCOUNTER — Other Ambulatory Visit: Payer: Medicare Other

## 2024-01-03 ENCOUNTER — Ambulatory Visit: Payer: Self-pay | Admitting: Internal Medicine

## 2024-01-03 NOTE — Telephone Encounter (Signed)
 Copied from CRM [id number]. Topic: Clinical - Red Word Triage >> Jan 03, 2024  1:35 PM Hilton Lucky wrote: Red Word that prompted transfer to Nurse Triage: Has COPD - sinus infection - phlegm is turning green and [facility]  TRIAGE SUMMARY NOTE: Pt reporting that she has been experiencing what she thinks is a sinus infection for the past 2-3 days, having intermittent worsening of SOB, intermittent wheezing, coughing a lot, clearing her throat a lot, lots of post-nasal drip that she coughs up making green sputum, she feels her "glands are swollen" and sore throat, also "food don't taste right," she's having chills intermittently, and her ears feel full. Pt confirms that she is negative for covid and flu, confirms no SOB, wheezing, or chills today, pt is speaking in full sentences. Pt reporting that she's using her oxygen  therapy more often, used her maintenance inhaler 3x yesterday and rescue inhaler 2x/day. Advised pt use her maintenance inhaler as prescribed 2x/day, advised use rescue inhaler when coughing/wheezing/SOB. Advised pt be examined today in next 4 hours, pt refusing, requesting meds be prescribed instead, requesting z-pak specifically. Advised that sending HP message over to pulm office for call back with further recommendations, advised call back if worsening. Pt verbalized understanding. Please advise.  E2C2 Pulmonary Triage - Initial Assessment Questions "Chief Complaint (e.g., cough, sob, wheezing, fever, chills, sweat or additional symptoms) *Go to specific symptom protocol after initial questions. Sinus infection Draining, sneezing, coughing, food don't taste right I get this every year Feeling it drain down my throat and coughing it up and green sputum Using her oxygen  therapy more often Glands are swollen, sore throat More SOB than normal, wheezing yesterday Feel awful Speaking in full sentences Having to clear throat a lot No appetite, vomiting,  diarrhea, no fever Having regular BM Negative for covid and flu, just pollen Still doing normal activities Not more SOB than normal today Ears feel full, one feels like it stays that way, decreased hearing Cold chills yesterday No sinus pain Requesting z-pak  "How long have symptoms been present?" 2 days almost 3  MEDICINES:   "Have you used any OTC meds to help with symptoms?" Yes If yes, ask "What medications?" Allergy relief generic meds  "Have you used your inhalers/maintenance medication?" Yes If yes, "What medications?" Albuterol  inhaler, helps when use it, used 2x  If inhaler, ask "How many puffs and how often?" Note: Review instructions on medication in the chart. 3x yesterday bevespi  supposed to take twice  OXYGEN : "Do you wear supplemental oxygen ?" Yes If yes, "How many liters are you supposed to use?" 2L, not gone up from it, not on oxygen  all the time but stayed on it half the day yesterday  "Do you monitor your oxygen  levels?" Yes If yes, "What is your reading (oxygen  level) today?" Good it was regular Reason for Disposition  [1] Longstanding difficulty breathing (e.g., CHF, COPD, emphysema) AND [2] WORSE than normal  Protocols used: Breathing Difficulty-A-AH

## 2024-01-03 NOTE — Telephone Encounter (Signed)
 Has COPD - sinus infection - phlegm is turning green and [facility] . Pt is requesting Z-PAK

## 2024-01-04 ENCOUNTER — Other Ambulatory Visit: Payer: Self-pay | Admitting: Internal Medicine

## 2024-01-04 MED ORDER — AZITHROMYCIN 250 MG PO TABS: 250.0000 mg | ORAL_TABLET | Freq: Every day | ORAL | 0 refills | Status: DC

## 2024-01-04 NOTE — Telephone Encounter (Signed)
 Copied from CRM [id number]. Topic: Clinical - Prescription Issue >> Jan 04, 2024  1:00 PM Crist Dominion wrote: Reason for CRM: Patient is requesting an update on the Z-pack she requested yesterday 5/525 as she is having excessive sinus drainage and coughing.

## 2024-01-04 NOTE — Telephone Encounter (Signed)
 Zpack sent.

## 2024-01-04 NOTE — Telephone Encounter (Signed)
 Pt notified on  identifiable AM

## 2024-01-04 NOTE — Telephone Encounter (Signed)
 Copied from CRM [id number]. Topic: Clinical - Medication Refill >> Jan 04, 2024  2:57 PM Ambrose Junk wrote: Most Recent Primary Care Visit:  Provider: WALSH, TANYA  Department: [facility]  Visit Type: NEW PATIENT  Date: 10/11/2023  Medication: azithromycin  (ZITHROMAX ) 250 MG tablet  Has the patient contacted their pharmacy? No (Agent: If no, request that the patient contact the pharmacy for the refill. If patient does not wish to contact the pharmacy document the reason why and proceed with request.) (Agent: If yes, when and what did the pharmacy advise?)  Is this the correct pharmacy for this prescription? No If no, delete pharmacy and type the correct one.  This is the patient's preferred pharmacy:   [facility] [address] [place] [facility] [address] [place] [address] Phone: [phone] Fax: [phone]   Has the prescription been filled recently? No  Is the patient out of the medication? Yes  Has the patient been seen for an appointment in the last year OR does the patient have an upcoming appointment? Yes  Can we respond through MyChart? No  Agent: Please be advised that Rx refills may take up to 3 business days. We ask that you follow-up with your pharmacy.

## 2024-01-04 NOTE — Telephone Encounter (Signed)
 This medication has already been signed and sent by another provider.

## 2024-01-12 ENCOUNTER — Ambulatory Visit

## 2024-02-08 ENCOUNTER — Ambulatory Visit
Admission: RE | Admit: 2024-02-08 | Discharge: 2024-02-08 | Disposition: A | Discharge status: Home / Self Care | Source: Ambulatory Visit | Attending: Family Medicine | Admitting: Family Medicine

## 2024-02-08 ENCOUNTER — Ambulatory Visit: Payer: Self-pay | Admitting: Family Medicine

## 2024-02-08 DIAGNOSIS — Z780 Asymptomatic menopausal state: Secondary | ICD-10-CM

## 2024-02-08 DIAGNOSIS — Z1231 Encounter for screening mammogram for malignant neoplasm of breast: Secondary | ICD-10-CM | POA: Insufficient documentation

## 2024-02-08 DIAGNOSIS — M810 Age-related osteoporosis without current pathological fracture: Secondary | ICD-10-CM

## 2024-02-08 MED ORDER — ROMOSOZUMAB-AQQG 105 MG/1.17ML ~~LOC~~ SOSY
210.0000 mg | PREFILLED_SYRINGE | Freq: Once | SUBCUTANEOUS | Status: AC
Start: 2024-02-08 — End: 2024-02-08

## 2024-02-14 ENCOUNTER — Ambulatory Visit: Payer: Self-pay | Admitting: Internal Medicine

## 2024-02-14 ENCOUNTER — Ambulatory Visit: Payer: Self-pay

## 2024-02-14 MED ORDER — AZITHROMYCIN 250 MG PO TABS
ORAL_TABLET | ORAL | 0 refills | Status: DC
Start: 2024-02-14 — End: 2024-04-27

## 2024-02-14 MED ORDER — PREDNISONE 20 MG PO TABS: 20.0000 mg | ORAL_TABLET | Freq: Every day | ORAL | 0 refills | Status: DC

## 2024-02-14 NOTE — Telephone Encounter (Signed)
 Dr. Auston Left is out of the office. Dr. Viva Grise please advise.

## 2024-02-14 NOTE — Telephone Encounter (Signed)
 FYI Only or Action Required?: Action required by provider  Patient is followed in Pulmonology for COPD, last seen on 07/02/2023 by Antonio Baumgarten, NP. Called Nurse Triage reporting Cough. Symptoms began yesterday. Interventions attempted: OTC medications: Sinus medication and Prescription medications: Albuterol  & . Symptoms are: gradually worsening.  Triage Disposition: No disposition on file.  Patient/caregiver understands and will follow disposition?: No, wishes to speak with PCP                  Copied from CRM [id number]. Topic: Clinical - Red Word Triage >> Feb 14, 2024 10:46 AM Hilton Lucky wrote: Red Word that prompted transfer to Nurse Triage: Coughing up pale yellow, ongoing cough and back pain  states got cold while in hospital and got sick while there due to the cold. Reason for Disposition  [1] Continuous (nonstop) coughing interferes with work or school AND [2] no improvement using cough treatment per Care Advice  Answer Assessment - Initial Assessment Questions Patient requests antibiotic and/or steroid   E2C2 Pulmonary Triage - Initial Assessment Questions Chief Complaint (e.g., cough, sob, wheezing, fever, chills, sweat or additional symptoms) *Go to specific symptom protocol after initial questions. Productive cough with yellow phlegm  How long have symptoms been present? Yesterday after visiting family at the hospital---patient stayed overnight and states the air conditioner at the hospital bothered her and made this start  Have you tested for COVID or Flu? Note: If not, ask patient if a home test can be taken. If so, instruct patient to call back for positive results. No  MEDICINES:   Have you used any OTC meds to help with symptoms? Yes If yes, ask What medications? Sinus medication  Have you used your inhalers/maintenance medication? Yes If yes, What medications? Albuterol  Inhaler--Inhale 2 puffs into the lungs every 6 (six) hours as  needed for wheezing or shortness of breath. Bevespi --INHALE 2 PUFFS INTO THE LUNGS TWICE A DAY  If inhaler, ask How many puffs and how often? Note: Review instructions on medication in the chart. Albuterol  Inhaler--Inhale 2 puffs into the lungs every 6 (six) hours as needed for wheezing or shortness of breath. Bevespi --INHALE 2 PUFFS INTO THE LUNGS TWICE A DAY  OXYGEN : Do you wear supplemental oxygen ? Yes If yes, How many liters are you supposed to use? 2  Do you monitor your oxygen  levels? Yes If yes, What is your reading (oxygen  level) today? 92%--patient states normal for her  What is your usual oxygen  saturation reading?  (Note: Pulmonary O2 sats should be 90% or greater) ---     2. SEVERITY: How bad is the cough today?      persistent 3. SPUTUM: Describe the color of your sputum (none, dry cough; clear, white, yellow, green)     Yellow 4. HEMOPTYSIS: Are you coughing up any blood? If so ask: How much? (flecks, streaks, tablespoons, etc.)     No 5. DIFFICULTY BREATHING: Are you having difficulty breathing? If Yes, ask: How bad is it? (e.g., mild, moderate, severe)    - MILD: No SOB at rest, mild SOB with walking, speaks normally in sentences, can lie down, no retractions, pulse < 100.    - MODERATE: SOB at rest, SOB with minimal exertion and prefers to sit, cannot lie down flat, speaks in phrases, mild retractions, audible wheezing, pulse 100-120.    - SEVERE: Very SOB at rest, speaks in single words, struggling to breathe, sitting hunched forward, retractions, pulse > 120      No  per patient 6. FEVER: Do you have a fever? If Yes, ask: What is your temperature, how was it measured, and when did it start?     No 7. CARDIAC HISTORY: Do you have any history of heart disease? (e.g., heart attack, congestive heart failure)      Yes 8. LUNG HISTORY: Do you have any history of lung disease?  (e.g., pulmonary embolus, asthma, emphysema)     COPD 9. PE  RISK FACTORS: Do you have a history of blood clots? (or: recent major surgery, recent prolonged travel, bedridden)     No 10. OTHER SYMPTOMS: Do you have any other symptoms? (e.g., runny nose, wheezing, chest pain)       Sneezing, sinus drainage/post nasal drip, back pain (patient states this is chronic)  Protocols used: Cough - Acute Productive-A-AH

## 2024-02-14 NOTE — Telephone Encounter (Signed)
 Per secure chat from Dr. Kasa- Ordered meds. If symptoms worse need to go to ER or UC    I have notified the patient.  Nothing further needed.

## 2024-02-14 NOTE — Telephone Encounter (Signed)
 FYI Only or Action Required?: Action required by provider  Patient was last seen in primary care on 10/11/2023 by Valli Gaw, MD. Called Nurse Triage reporting Back Pain. Symptoms began CHRONIC ISSUE PER PATIENT. Interventions attempted: OTC medications: Tylenol , Advil  and Rest, hydration, or home remedies. Symptoms are: gradually worsening.  Triage Disposition: See PCP When Office is Open (Within 3 Days)  Patient/caregiver understands and will follow disposition?: No, wishes to speak with PCP.                Reason for Disposition . [1] MODERATE back pain (e.g., interferes with normal activities) AND [2] present > 3 days  Answer Assessment - Initial Assessment Questions 1. ONSET: When did the pain begin?      It's been there  Ive talked to my PCP about it before 2. LOCATION: Where does it hurt? (upper, mid or lower back)     Middle of back where bra strap hooks 3. SEVERITY: How bad is the pain?  (e.g., Scale 1-10; mild, moderate, or severe)   - MILD (1-3): Doesn't interfere with normal activities.    - MODERATE (4-7): Interferes with normal activities or awakens from sleep.    - SEVERE (8-10): Excruciating pain, unable to do any normal activities.      4 4. PATTERN: Is the pain constant? (e.g., yes, no; constant, intermittent)      Sometimes it stays back there 5. RADIATION: Does the pain shoot into your legs or somewhere else?     No 6. CAUSE:  What do you think is causing the back pain?      Unknown 8. MEDICINES: What have you taken so far for the pain? (e.g., nothing, acetaminophen , NSAIDS)     Tylenol , Advil  9. NEUROLOGIC SYMPTOMS: Do you have any weakness, numbness, or problems with bowel/bladder control?     No 10. OTHER SYMPTOMS: Do you have any other symptoms? (e.g., fever, abdomen pain, burning with urination, blood in urine)      Productive cough---message about this sent to her pulmonologist just prior to this triage   Patient was  advised that the recommendation for her back pain was for her to see her PCP within three days--Patient states that she has seen her PCP for this before and was advised that she may need to see a specialist for her back. Patient didn't want to make an appointment with her PCP for this back pain but wanted a message sent to her PCP asking about a possible referral to a back specialist. She is also advised that if anything worsens to go to the Emergency Room or call 911. Patient verbalized understanding.  Protocols used: Back Pain-A-AH

## 2024-02-15 NOTE — Telephone Encounter (Signed)
 Called pt and she stated that she is real busy and will call us  back.  Okay to relay message from provider. Document when spoke to.

## 2024-02-17 ENCOUNTER — Telehealth: Payer: Self-pay

## 2024-02-17 NOTE — Telephone Encounter (Signed)
 Copied from CRM [id number]. Topic: Clinical - Lab/Test Results >> Feb 17, 2024  8:48 AM Martinique E wrote: Reason for CRM: Patient called wanting to go over her breast exam results. Callback number for patient is [phone].

## 2024-02-18 ENCOUNTER — Ambulatory Visit: Admitting: *Deleted

## 2024-02-18 VITALS — Ht 63.0 in | Wt 118.0 lb

## 2024-02-18 DIAGNOSIS — Z0000 Encounter for general adult medical examination without abnormal findings: Secondary | ICD-10-CM

## 2024-02-18 NOTE — Patient Instructions (Signed)
 Kelli Phillips , Thank you for taking time out of your busy schedule to complete your Annual Wellness Visit with me. I enjoyed our conversation and look forward to speaking with you again next year. I, as well as your care team,  appreciate your ongoing commitment to your health goals. Please review the following plan we discussed and let me know if I can assist you in the future. Your Game plan/ To Do List    Referrals: If you haven't heard from the office you've been referred to, please reach out to them at the phone provided.  Consider updating your vaccines.  Follow up Visits: Next Medicare AWV with our clinical staff: 02/20/25 @ 9:30   Have you seen your provider in the last 6 months (3 months if uncontrolled diabetes)? Yes Next Office Visit with your provider: Will call the office and do a Transfer of Care since her PCP has left  Clinician Recommendations:  Aim for 30 minutes of exercise or brisk walking, 6-8 glasses of water, and 5 servings of fruits and vegetables each day.       This is a list of the screening recommended for you and due dates:  Health Maintenance  Topic Date Due   DTaP/Tdap/Td vaccine (1 - Tdap) Never done   Pneumococcal Vaccine for age over 92 (1 of 2 - PCV) Never done   Colon Cancer Screening  Never done   Zoster (Shingles) Vaccine (1 of 2) Never done   COVID-19 Vaccine (2 - 2024-25 season) 05/02/2023   Flu Shot  03/31/2024   Medicare Annual Wellness Visit  02/17/2025   DEXA scan (bone density measurement)  Completed   Hepatitis C Screening  Completed   HPV Vaccine  Aged Out   Meningitis B Vaccine  Aged Out   Screening for Lung Cancer  Discontinued    Advanced directives: (ACP Link)Information on Advanced Care Planning can be found at [facility]  Secretary of [facility] Advance Health Care Directives Advance Health Care Directives. [email or web address]  Advance Care Planning is important because it:  [x]  Makes sure you receive the medical care that is consistent with your  values, goals, and preferences  [x]  It provides guidance to your family and loved ones and reduces their decisional burden about whether or not they are making the right decisions based on your wishes.  Follow the link provided in your after visit summary or read over the paperwork we have mailed to you to help you started getting your Advance Directives in place. If you need assistance in completing these, please reach out to us  so that we can help you!

## 2024-02-18 NOTE — Progress Notes (Signed)
 Subjective:   Kelli Phillips is a 73 y.o. who presents for a Medicare Wellness preventive visit.  As a reminder, Annual Wellness Visits don't include a physical exam, and some assessments may be limited, especially if this visit is performed virtually. We may recommend an in-person follow-up visit with your provider if needed.  Visit Complete: Virtual I connected with  Kelli Phillips on 02/18/24 by a audio enabled telemedicine application and verified that I am speaking with the correct person using two identifiers.  Patient Location: Home  Provider Location: Home Office  I discussed the limitations of evaluation and management by telemedicine. The patient expressed understanding and agreed to proceed.  Vital Signs: Because this visit was a virtual/telehealth visit, some criteria may be missing or patient reported. Any vitals not documented were not able to be obtained and vitals that have been documented are patient reported.  VideoDeclined- This patient declined Librarian, academic. Therefore the visit was completed with audio only.  Persons Participating in Visit: Patient.  AWV Questionnaire: No: Patient Medicare AWV questionnaire was not completed prior to this visit.  Cardiac Risk Factors include: advanced age (>70men, >76 women);Other (see comment), Risk factor comments: H/O smoking     Objective:    Today's Vitals   02/18/24 0822  Weight: 118 lb (53.5 kg)  Height: 5' 3 (1.6 m)   Body mass index is 20.9 kg/m.     02/18/2024    8:38 AM 11/14/2023    3:44 PM 09/23/2023    2:06 AM 03/06/2023    9:59 AM 06/16/2022   11:21 AM 10/01/2021    6:00 PM 10/01/2021    8:37 AM  Advanced Directives  Does Patient Have a Medical Advance Directive? No No No No No Yes No  Type of Advance Directive      Healthcare Power of Attorney   Does patient want to make changes to medical advance directive?      No - Patient declined   Copy of Healthcare Power of  Attorney in Chart?      No - copy requested   Would patient like information on creating a medical advance directive? No - Patient declined   No - Patient declined No - Patient declined      Current Medications (verified) Outpatient Encounter Medications as of 02/18/2024  Medication Sig   albuterol  (VENTOLIN  HFA) 108 (90 Base) MCG/ACT inhaler Inhale 2 puffs into the lungs every 6 (six) hours as needed for wheezing or shortness of breath.   azithromycin  (ZITHROMAX  Z-PAK) 250 MG tablet Take 2 tablets on Day 1 and then 1 tablet daily till gone.   azithromycin  (ZITHROMAX ) 250 MG tablet Take 1 tablet (250 mg total) by mouth daily.   cyanocobalamin  (VITAMIN B12) 1000 MCG tablet Take 2 tablets (2,000 mcg total) by mouth daily.   Glycopyrrolate -Formoterol  (BEVESPI  AEROSPHERE) 9-4.8 MCG/ACT AERO INHALE 2 PUFFS INTO THE LUNGS TWICE A DAY   predniSONE  (DELTASONE ) 20 MG tablet Take 1 tablet (20 mg total) by mouth daily with breakfast. 10 days   Vitamin D , Ergocalciferol , (DRISDOL ) 1.25 MG (50000 UNIT) CAPS capsule Take 1 capsule (50,000 Units total) by mouth every 7 (seven) days.   No facility-administered encounter medications on file as of 02/18/2024.    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   Heart failure Maternal Grandmother    Social History   Socioeconomic History   Marital status: Divorced    Spouse name: Not on file   Number of children: Not on file   Years of education: Not on file   Highest education level: Not on file  Occupational History   Not on file  Tobacco Use   Smoking status: Former    Current packs/day: 0.00    Average packs/day: 1 pack/day for 43.0 years (43.0 ttl pk-yrs)    Types: Cigarettes    Start date: 84    Quit date: 2009    Years since quitting: 16.4   Smokeless tobacco: Never  Vaping Use   Vaping status: Never Used  Substance and Sexual Activity   Alcohol use: No   Drug use: No   Sexual activity: Yes    Birth control/protection: Post-menopausal  Other Topics Concern   Not on file  Social History Narrative   Not on file   Social Drivers of Health   Financial Resource Strain: Low Risk  (02/18/2024)   Overall Financial Resource Strain (CARDIA)    Difficulty of Paying Living Expenses: Not hard at all  Food Insecurity: No Food Insecurity (02/18/2024)   Hunger Vital Sign    Worried About Running Out of Food in the Last Year: Never true    Ran Out of Food in the Last Year: Never true  Transportation Needs: No Transportation Needs (02/18/2024)   PRAPARE - Administrator, Civil Service (Medical): No    Lack of Transportation (Non-Medical): No  Physical Activity: Insufficiently Active (02/18/2024)   Exercise Vital Sign    Days of Exercise per Week: 3 days    Minutes of Exercise per Session: 20 min  Stress: No Stress Concern Present (02/18/2024)   [employer] of Occupational Health - Occupational Stress Questionnaire    Feeling of Stress: Only a little  Social Connections: Moderately Isolated (02/18/2024)   Social Connection and Isolation Panel    Frequency of Communication with Friends and Family: More than three times a week    Frequency of Social Gatherings with Friends and Family: More  than three times a week    Attends Religious Services: More than 4 times per year    Active Member of [employer] or Organizations: No    Attends Banker Meetings: Never    Marital Status: Divorced    Tobacco Counseling Counseling given: Not Answered    Clinical Intake:  Pre-visit preparation completed: Yes  Pain : No/denies pain     BMI - recorded: 20.9 Nutritional Status: BMI of 19-24  Normal Nutritional Risks: Nausea/ vomitting/ diarrhea (diarrhea on antibiotic for 3 days, better today) Diabetes:  No  Lab Results  Component Value Date   HGBA1C 5.5 10/11/2023     How often do you need to have someone help you when you read instructions, pamphlets, or other written materials from your doctor or pharmacy?: 1 - Never  Interpreter Needed?: No  Information entered by :: R. Jahmani Staup LPN   Activities of Daily Living     02/18/2024    8:25 AM  In your present state of health, do you have any difficulty performing the following activities:  Hearing? 0  Vision? 0  Comment glasses  Difficulty concentrating or making decisions? 0  Walking or climbing stairs? 0  Dressing or bathing? 0  Doing errands, shopping? 0  Preparing Food and eating ? N  Using the Toilet? N  In the past six months, have you accidently leaked urine? N  Do you have problems with loss of bowel control? N  Managing your Medications? N  Managing your Finances? N  Housekeeping or managing your Housekeeping? N    Patient Care Team: Valli Gaw, MD as PCP - General (Family Medicine) Kasa, Kurian, MD as Consulting Physician (Pulmonary Disease) Gollan, Timothy J, MD as Consulting Physician (Cardiology)  I have updated your Care Teams any recent Medical Services you may have received from other providers in the past year.     Assessment:   This is a routine wellness examination for Kelli Phillips.  Hearing/Vision screen Hearing Screening - Comments:: No issues Vision Screening - Comments::  [email or web address]   Goals Addressed             This Visit's Progress    Patient Stated       Wants to continue to walk        Depression Screen     02/18/2024    8:33 AM 10/11/2023    2:21 PM  PHQ 2/9 Scores  PHQ - 2 Score 1 1  PHQ- 9 Score 1 2    Fall Risk     02/18/2024    8:29 AM 10/11/2023    2:21 PM  Fall Risk   Falls in the past year? 0 0  Number falls in past yr: 0 0  Injury with Fall? 0 0  Risk for fall due to : No Fall Risks No Fall Risks  Follow up Falls evaluation completed;Falls prevention discussed Falls evaluation completed;Education provided    MEDICARE RISK AT HOME:  Medicare Risk at Home Any stairs in or around the home?: Yes If so, are there any without handrails?: No Home free of loose throw rugs in walkways, pet beds, electrical cords, etc?: Yes Adequate lighting in your home to reduce risk of falls?: Yes Life alert?: No Use of a cane, [place] or w/c?: No Grab bars in the bathroom?: Yes Shower chair or bench in shower?: No Elevated toilet seat or a handicapped toilet?: No  TIMED UP AND GO:  Was the test performed?  No  Cognitive Function: 6CIT completed        02/18/2024    8:39 AM  6CIT Screen  What month? 0 points  What time? 0 points  Count back from 20 4 points  Months in reverse 4 points  Repeat phrase 4 points    Immunizations Immunization History  Administered Date(s) Administered   Moderna Sars-Covid-2 Vaccination 02/19/2020    Screening Tests Health Maintenance  Topic Date Due   Medicare Annual Wellness (AWV)  Never done   DTaP/Tdap/Td (1 - Tdap) Never done   Pneumococcal Vaccine: 50+ Years (  1 of 2 - PCV) Never done   Colonoscopy  Never done   Zoster Vaccines- Shingrix (1 of 2) Never done   COVID-19 Vaccine (2 - 2024-25 season) 05/02/2023   INFLUENZA VACCINE  03/31/2024   DEXA SCAN  Completed   Hepatitis C Screening  Completed   HPV VACCINES  Aged Out   Meningococcal B Vaccine  Aged Out   Lung Cancer  Screening  Discontinued    Health Maintenance  Health Maintenance Due  Topic Date Due   Medicare Annual Wellness (AWV)  Never done   DTaP/Tdap/Td (1 - Tdap) Never done   Pneumococcal Vaccine: 50+ Years (1 of 2 - PCV) Never done   Colonoscopy  Never done   Zoster Vaccines- Shingrix (1 of 2) Never done   COVID-19 Vaccine (2 - 2024-25 season) 05/02/2023   Health Maintenance Items Addressed: Discussed the need to update vaccines which patient declines vaccines. Patient may consider the tetanus (Tdap). Patient stated that she is not able to have a colonoscopy because of the problems with her colon.  Additional Screening:  Vision Screening: Recommended annual ophthalmology exams for early detection of glaucoma and other disorders of the eye. Up to date  Patient is unsure of he [place] Would you like a referral to an eye doctor? No    Dental Screening: Recommended annual dental exams for proper oral hygiene  Community Resource Referral / Chronic Care Management: CRR required this visit?  No   CCM required this visit?  No   Plan:    I have personally reviewed and noted the following in the patient's chart:   Medical and social history Use of alcohol, tobacco or illicit drugs  Current medications and supplements including opioid prescriptions. Patient is not currently taking opioid prescriptions. Functional ability and status Nutritional status Physical activity Advanced directives List of other physicians Hospitalizations, surgeries, and ER visits in previous 12 months Vitals Screenings to include cognitive, depression, and falls Referrals and appointments  In addition, I have reviewed and discussed with patient certain preventive protocols, quality metrics, and best practice recommendations. A written personalized care plan for preventive services as well as general preventive health recommendations were provided to patient.   Felicitas Horse,  LPN   [id number]   After Visit Summary: (MyChart) Due to this being a telephonic visit, the after visit summary with patients personalized plan was offered to patient via MyChart   Notes: Nothing significant to report at this time.

## 2024-02-22 NOTE — Telephone Encounter (Signed)
 Left message to return call to our office.  Okay to relay message. Please document when pt is spoke to.

## 2024-02-23 NOTE — Telephone Encounter (Signed)
 Left message to return call to our office.

## 2024-02-24 NOTE — Telephone Encounter (Signed)
 LMTCB. Please relay the message below from Dr. Hope.

## 2024-02-29 NOTE — Telephone Encounter (Signed)
 4th attempt sending out letter.

## 2024-03-02 ENCOUNTER — Emergency Department

## 2024-03-02 ENCOUNTER — Emergency Department
Admission: EM | Admit: 2024-03-02 | Discharge: 2024-03-03 | Disposition: A | Discharge status: Home / Self Care | Attending: Emergency Medicine | Admitting: Emergency Medicine

## 2024-03-02 ENCOUNTER — Other Ambulatory Visit: Payer: Self-pay

## 2024-03-02 DIAGNOSIS — T71193A Asphyxiation due to mechanical threat to breathing due to other causes, assault, initial encounter: Secondary | ICD-10-CM | POA: Diagnosis present

## 2024-03-02 DIAGNOSIS — T71194A Asphyxiation due to mechanical threat to breathing due to other causes, undetermined, initial encounter: Secondary | ICD-10-CM

## 2024-03-02 LAB — BASIC METABOLIC PANEL WITH GFR
Anion gap: 8 (ref 5–15)
BUN: 11 mg/dL (ref 8–23)
CO2: 28 mmol/L (ref 22–32)
Calcium: 9.2 mg/dL (ref 8.9–10.3)
Chloride: 104 mmol/L (ref 98–111)
Creatinine, Ser: 0.8 mg/dL (ref 0.44–1.00)
GFR, Estimated: >60 mL/min (ref >60)
Glucose, Bld: 109 mg/dL — ABNORMAL HIGH (ref 70–99)
Potassium: 3.4 mmol/L — ABNORMAL LOW (ref 3.5–5.1)
Sodium: 140 mmol/L (ref 135–145)

## 2024-03-02 LAB — CBC WITH DIFFERENTIAL/PLATELET
Abs Immature Granulocytes: 0.03 10*3/uL (ref 0.00–0.07)
Basophils Absolute: 0.1 10*3/uL (ref 0.0–0.1)
Basophils Relative: 1 %
Eosinophils Absolute: 0.1 10*3/uL (ref 0.0–0.5)
Eosinophils Relative: 2 %
HCT: 43.1 % (ref 36.0–46.0)
Hemoglobin: 13.6 g/dL (ref 12.0–15.0)
Immature Granulocytes: 0 %
Lymphocytes Relative: 19 %
Lymphs Abs: 1.3 10*3/uL (ref 0.7–4.0)
MCH: 29 pg (ref 26.0–34.0)
MCHC: 31.6 g/dL (ref 30.0–36.0)
MCV: 91.9 fL (ref 80.0–100.0)
Monocytes Absolute: 0.5 10*3/uL (ref 0.1–1.0)
Monocytes Relative: 7 %
Neutro Abs: 4.9 10*3/uL (ref 1.7–7.7)
Neutrophils Relative %: 71 %
Platelets: 199 10*3/uL (ref 150–400)
RBC: 4.69 MIL/uL (ref 3.87–5.11)
RDW: 13.5 % (ref 11.5–15.5)
WBC: 6.9 10*3/uL (ref 4.0–10.5)
nRBC: 0 % (ref 0.0–0.2)

## 2024-03-02 MED ORDER — IOHEXOL 350 MG/ML SOLN
75.0000 mL | Freq: Once | INTRAVENOUS | Status: AC | PRN
  Administered 2024-03-02: 75 mL via INTRAVENOUS
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
## 2024-03-03 MED ORDER — ACETAMINOPHEN 500 MG PO TABS
1000.0000 mg | ORAL_TABLET | Freq: Once | ORAL | Status: AC
  Administered 2024-03-03: 1000 mg via ORAL
  Filled 2024-03-03: qty 2
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
## 2024-03-03 NOTE — ED Notes (Signed)
 Adult Protective Services called this RN back at this time. This RN to file an official report.

## 2024-03-03 NOTE — ED Notes (Signed)
 Social Work and Case Management has been Microbiologist about this Patient.  Awaiting their direction.
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
## 2024-03-03 NOTE — ED Notes (Signed)
 Pt verbalized understanding of discharge instructions. Opportunity for questions provided.  Pt provided resources by CSW and [facility] Sheriff's Department.

## 2024-03-03 NOTE — ED Notes (Signed)
 This RN contacted Adult Protective Services at this time. Currently awaiting a call back. Charge RN aware.
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
## 2024-03-06 ENCOUNTER — Other Ambulatory Visit: Payer: Self-pay | Admitting: *Deleted

## 2024-03-06 DIAGNOSIS — J449 Chronic obstructive pulmonary disease, unspecified: Secondary | ICD-10-CM

## 2024-03-06 MED ORDER — ALBUTEROL SULFATE HFA 108 (90 BASE) MCG/ACT IN AERS: 2.0000 | INHALATION_SPRAY | Freq: Four times a day (QID) | RESPIRATORY_TRACT | 5 refills | Status: DC | PRN

## 2024-03-06 NOTE — Telephone Encounter (Signed)
 SABRA

## 2024-03-06 NOTE — Telephone Encounter (Signed)
 Pt went to ED on 03/02/24.  Inhaler was prescribed by pulmonology. Pt needs to contact them for refill.  Will route to Dr Isaiah office & pend medication. (see note below)

## 2024-03-14 ENCOUNTER — Telehealth: Payer: Self-pay

## 2024-03-14 NOTE — Telephone Encounter (Signed)
 Copied from CRM [id number]. Topic: Clinical - Lab/Test Results >> Mar 14, 2024  1:16 PM Berneda F wrote: Reason for CRM: Pt is concerned because she never heard back about her results and her PCP has left. She got a new number and I am updating her chart with her new number as well.  Please call back for test results [phone]

## 2024-03-15 NOTE — Telephone Encounter (Signed)
 Left message to return call to our office.  Okay to relay message to pt. Please document when pt is spoke to. Dr. Hope said No suspicious findings of malignancy. Repeat screening in 1 year.

## 2024-03-15 NOTE — Telephone Encounter (Signed)
 l

## 2024-03-20 NOTE — Telephone Encounter (Signed)
 Left message to return call to our office.

## 2024-03-22 ENCOUNTER — Telehealth: Payer: Self-pay

## 2024-03-22 NOTE — Telephone Encounter (Signed)
 3rd attempt to reach pt sending letter out to pt.

## 2024-03-22 NOTE — Telephone Encounter (Signed)
 Copied from CRM [id number]. Topic: Clinical - Lab/Test Results >> Mar 22, 2024  3:11 PM Gennette ORN wrote: Reason for CRM: Patient was returning missed call relayed messages to patient she is happy with results.

## 2024-03-22 NOTE — Telephone Encounter (Signed)
 Noted

## 2024-03-22 NOTE — Telephone Encounter (Unsigned)
 Copied from CRM [id number]. Topic: Clinical - Lab/Test Results >> Mar 22, 2024  3:11 PM Gennette ORN wrote: Reason for CRM: Patient was returning missed call relayed messages to patient she is happy with results.

## 2024-04-05 ENCOUNTER — Ambulatory Visit

## 2024-04-18 ENCOUNTER — Other Ambulatory Visit: Payer: Self-pay | Admitting: Internal Medicine

## 2024-04-18 ENCOUNTER — Telehealth: Payer: Self-pay

## 2024-04-18 DIAGNOSIS — J449 Chronic obstructive pulmonary disease, unspecified: Secondary | ICD-10-CM

## 2024-04-18 NOTE — Progress Notes (Signed)
 Pt called stating she has been having episodes of back pain, neck pain, breast pain, arm pain and numbness for a while now. Pt does not currently have these sx and declines to go currently. Advised pt to go to ER if symptoms continue or worsen. Provider also advised pt to go to ER if anything continues or worsens. Pt called to request f/u appt with Ellouise Class, FNP. Appt made 04/25/24 at 10:30 am. Pt plans to arrange her own transportation for appt Tuesday.

## 2024-04-24 ENCOUNTER — Ambulatory Visit: Payer: Self-pay | Admitting: Internal Medicine

## 2024-04-24 NOTE — Telephone Encounter (Signed)
 FYI Only or Action Required?: Action required by provider: update on patient condition and patient requesting recommendations for symptoms.  Patient is followed in Pulmonology for COPD, last seen on 07/02/2023 by [name] ORN, NP.  Called Nurse Triage reporting Cough.  Symptoms began several days ago.  Interventions attempted: Rescue inhaler and Maintenance inhaler.  Symptoms are: unchanged.  Triage Disposition: See Physician Within 24 Hours-no transportation-wanting recommendations  Patient/caregiver understands and will follow disposition?: No, wishes to speak with PCP  Copied from CRM #[id number]. Topic: Clinical - Red Word Triage >> Apr 24, 2024 12:31 PM Kelli Phillips wrote: [facility] that prompted transfer to Nurse Triage: COUGHING, SNEEZING,CHILLS, COUGHING UP GREEN MUCUS Reason for Disposition  [1] Known COPD or other severe lung disease (i.e., bronchiectasis, cystic fibrosis, lung surgery) AND [2] symptoms getting worse (i.e., increased sputum purulence or amount, increased breathing difficulty  Answer Assessment - Initial Assessment Questions 1. ONSET: When did the cough begin?      Started Saturday 2. SEVERITY: How bad is the cough today?      mild 3. SPUTUM: Describe the color of your sputum (e.g., none, dry cough; clear, white, yellow, green)     green 4. HEMOPTYSIS: Are you coughing up any blood? If Yes, ask: How much? (e.g., flecks, streaks, tablespoons, etc.)     no 5. DIFFICULTY BREATHING: Are you having difficulty breathing? If Yes, ask: How bad is it? (e.g., mild, moderate, severe)      mild 6. FEVER: Do you have a fever? If Yes, ask: What is your temperature, how was it measured, and when did it start?     no 7. CARDIAC HISTORY: Do you have any history of heart disease? (e.g., heart attack, congestive heart failure)      CHF 8. LUNG HISTORY: Do you have any history of lung disease?  (e.g., pulmonary embolus, asthma, emphysema)     COPD 9.  PE RISK FACTORS: Do you have a history of blood clots? (or: recent major surgery, recent prolonged travel, bedridden)     no 10. OTHER SYMPTOMS: Do you have any other symptoms? (e.g., runny nose, wheezing, chest pain)       Wheezing, chills 12. TRAVEL: Have you traveled out of the country in the last month? (e.g., travel history, exposures)       no  Protocols used: Cough - Acute Productive-A-AH

## 2024-04-24 NOTE — Telephone Encounter (Signed)
 Overdue for follow up. Has not been seen since November 2024. Will need to be assess in person at [facility] or PCP, unless we have acute visit availability. Thanks.

## 2024-04-24 NOTE — Telephone Encounter (Signed)
 LMTCB. E2C2 please advise when patient calls back.

## 2024-04-24 NOTE — Telephone Encounter (Signed)
 Dr. Auston Left is out of the office. Dr. Viva Grise please advise.

## 2024-04-24 NOTE — Telephone Encounter (Signed)
 FYI Only or Action Required?: Action required by provider: Pt called back to [facility] [address] [facility] [place].  Patient is followed in Pulmonology for COPD and emphysema, last seen on 07/02/2023 by [name] ORN, NP.  Called Nurse Triage reporting Cough, medication question, Chills, and chest congestion.  Triage Disposition: See Physician Within 24 Hours - Agreed with previous triage nurse  Patient/caregiver understands and will follow disposition?: No, wishes to speak with PCP      Copied from CRM #[id number]. Topic: Clinical - Red Word Triage >> Apr 24, 2024 12:55 PM Russell PARAS wrote: Red Word that prompted transfer to Nurse Triage:   Symptoms started Saturday  Severe cough Has green phlegm Runny nose Intermittent chills Having back pain from coughing. No vomiting or fever  Pt of Dr. Isaiah Reason for Disposition  Caller has already spoken with another triager or doctor (or NP/PA, pharmacist) AND has further questions AND triager able to answer questions.  Answer Assessment - Initial Assessment Questions Just calling if could get meds sent in, don't want to go anywhere  Cold chills like have a shiver when go outside Not shaking, not continually shivering  [facility] [address] [facility] [place], takes buses, they'll kick you out if coughing Been doing good, but virus going on Feel a little bit better right now but don't want to get out of stuff  Protocols used: No Contact or Duplicate Contact Call-A-AH

## 2024-04-25 ENCOUNTER — Encounter: Admitting: Family

## 2024-04-25 NOTE — Telephone Encounter (Signed)
 I spoke with the patient. She sounded out of breath on the phone. She could not find her oximeter so she could check her O2. She said it has been running low. She feels her symptoms are coming from her cleaning her daughters house. I asked if she could come in tomorrow to be seen. She said she does not have a ride and she does not go anywhere. I told her I would have to send a message back to Dr. Isaiah and get his suggestion. She said she was going to her friends house today and would be out of the house.  I told her I would give her a call back tomorrow.   Dr. Isaiah please advise. FYI she canceled her appt with the heart failure clinic that was scheduled for today.

## 2024-04-26 NOTE — Telephone Encounter (Signed)
 Patient called back today and scheduled an acute appt with Dr.Dgayli.  Nothing further needed.

## 2024-04-27 ENCOUNTER — Telehealth (HOSPITAL_BASED_OUTPATIENT_CLINIC_OR_DEPARTMENT_OTHER): Payer: Self-pay

## 2024-04-27 ENCOUNTER — Emergency Department
Admission: EM | Admit: 2024-04-27 | Discharge: 2024-04-29 | Disposition: A | Discharge status: Home / Self Care | Attending: Emergency Medicine | Admitting: Emergency Medicine

## 2024-04-27 DIAGNOSIS — J449 Chronic obstructive pulmonary disease, unspecified: Secondary | ICD-10-CM | POA: Insufficient documentation

## 2024-04-27 DIAGNOSIS — F29 Unspecified psychosis not due to a substance or known physiological condition: Secondary | ICD-10-CM | POA: Diagnosis not present

## 2024-04-27 DIAGNOSIS — Z79899 Other long term (current) drug therapy: Secondary | ICD-10-CM | POA: Diagnosis not present

## 2024-04-27 DIAGNOSIS — F419 Anxiety disorder, unspecified: Secondary | ICD-10-CM

## 2024-04-27 DIAGNOSIS — F23 Brief psychotic disorder: Secondary | ICD-10-CM

## 2024-04-27 LAB — COMPREHENSIVE METABOLIC PANEL WITH GFR
ALT: 13 U/L (ref 0–44)
AST: 100 U/L — ABNORMAL HIGH (ref 15–41)
Albumin: 4 g/dL (ref 3.5–5.0)
Alkaline Phosphatase: 72 U/L (ref 38–126)
Anion gap: 10 (ref 5–15)
BUN: 12 mg/dL (ref 8–23)
CO2: 27 mmol/L (ref 22–32)
Calcium: 9.4 mg/dL (ref 8.9–10.3)
Chloride: 104 mmol/L (ref 98–111)
Creatinine, Ser: 0.73 mg/dL (ref 0.44–1.00)
GFR, Estimated: >60 mL/min (ref >60)
Glucose, Bld: 105 mg/dL — ABNORMAL HIGH (ref 70–99)
Potassium: 3.8 mmol/L (ref 3.5–5.1)
Sodium: 141 mmol/L (ref 135–145)
Total Bilirubin: 0.7 mg/dL (ref 0.0–1.2)
Total Protein: 6.5 g/dL (ref 6.5–8.1)

## 2024-04-27 LAB — URINALYSIS, W/ REFLEX TO CULTURE (INFECTION SUSPECTED)
Bacteria, UA: NONE SEEN
Bilirubin Urine: NEGATIVE
Glucose, UA: NEGATIVE mg/dL
Ketones, ur: NEGATIVE mg/dL
Nitrite: NEGATIVE
Protein, ur: NEGATIVE mg/dL
Specific Gravity, Urine: 1.014 (ref 1.005–1.030)
pH: 5 (ref 5.0–8.0)

## 2024-04-27 LAB — URINE DRUG SCREEN, QUALITATIVE (ARMC ONLY)
Amphetamines, Ur Screen: NOT DETECTED
Barbiturates, Ur Screen: NOT DETECTED
Benzodiazepine, Ur Scrn: NOT DETECTED
Cannabinoid 50 Ng, Ur ~~LOC~~: NOT DETECTED
Cocaine Metabolite,Ur ~~LOC~~: NOT DETECTED
MDMA (Ecstasy)Ur Screen: NOT DETECTED
Methadone Scn, Ur: NOT DETECTED
Opiate, Ur Screen: NOT DETECTED
Phencyclidine (PCP) Ur S: NOT DETECTED
Tricyclic, Ur Screen: NOT DETECTED

## 2024-04-27 LAB — ETHANOL: Alcohol, Ethyl (B): <15 mg/dL (ref <15)

## 2024-04-27 LAB — CBC
HCT: 43.8 % (ref 36.0–46.0)
Hemoglobin: 14.5 g/dL (ref 12.0–15.0)
MCH: 29.8 pg (ref 26.0–34.0)
MCHC: 33.1 g/dL (ref 30.0–36.0)
MCV: 89.9 fL (ref 80.0–100.0)
Platelets: 195 K/uL (ref 150–400)
RBC: 4.87 MIL/uL (ref 3.87–5.11)
RDW: 12.8 % (ref 11.5–15.5)
WBC: 5.5 K/uL (ref 4.0–10.5)
nRBC: 0 % (ref 0.0–0.2)

## 2024-04-27 MED ORDER — ALBUTEROL SULFATE (2.5 MG/3ML) 0.083% IN NEBU: 2.5000 mg | INHALATION_SOLUTION | Freq: Four times a day (QID) | RESPIRATORY_TRACT | Status: DC | PRN

## 2024-04-27 MED ORDER — ALUM & MAG HYDROXIDE-SIMETH 200-200-20 MG/5ML PO SUSP: 30.0000 mL | Freq: Four times a day (QID) | ORAL | Status: DC | PRN

## 2024-04-27 MED ORDER — ARFORMOTEROL TARTRATE 15 MCG/2ML IN NEBU
15.0000 ug | INHALATION_SOLUTION | Freq: Two times a day (BID) | RESPIRATORY_TRACT | Status: DC
  Filled 2024-04-27 (×2): qty 2

## 2024-04-27 MED ORDER — UMECLIDINIUM BROMIDE 62.5 MCG/ACT IN AEPB
1.0000 | INHALATION_SPRAY | Freq: Every day | RESPIRATORY_TRACT | Status: DC
  Filled 2024-04-27: qty 7

## 2024-04-27 MED ORDER — IBUPROFEN 600 MG PO TABS
600.0000 mg | ORAL_TABLET | Freq: Three times a day (TID) | ORAL | Status: DC | PRN
Start: 2024-04-27 — End: 2024-04-29
  Administered 2024-04-28 – 2024-04-29 (×3): 600 mg via ORAL
  Filled 2024-04-27 (×3): qty 1

## 2024-04-27 MED ORDER — ONDANSETRON HCL 4 MG PO TABS: 4.0000 mg | ORAL_TABLET | Freq: Three times a day (TID) | ORAL | Status: DC | PRN

## 2024-04-27 MED ORDER — ALBUTEROL SULFATE HFA 108 (90 BASE) MCG/ACT IN AERS
2.0000 | INHALATION_SPRAY | RESPIRATORY_TRACT | Status: DC
  Administered 2024-04-27 – 2024-04-28 (×2): 2 via RESPIRATORY_TRACT
  Filled 2024-04-27: qty 6.7
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
## 2024-04-27 NOTE — ED Notes (Signed)
 Assumed care of patient, pt resting in room 3, dinner tray has been provided, no distress noted

## 2024-04-27 NOTE — ED Notes (Signed)
 VOL MOVED TO BHU 1  PENDING CONSULT

## 2024-04-27 NOTE — ED Notes (Signed)
 Pt refusing snack and drink at this time. Pt calm and cooperative.

## 2024-04-27 NOTE — ED Notes (Signed)
 Pt dressed out in triage. Paper scrubs and yellow socks on pt.

## 2024-04-27 NOTE — ED Notes (Signed)
 Pt friend Erminio: [phone]

## 2024-04-27 NOTE — Telephone Encounter (Signed)
 NFN
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
## 2024-04-27 NOTE — Telephone Encounter (Signed)
 Reached out to pt to find out more details as I dont see she's on a CPAP, she states she dosen't want anything to do with this CPAP and she has an emergency call and can't talk to me right now.   Copied from CRM #[id number]. Topic: Clinical - Medication Question >> Apr 26, 2024  2:45 PM Kelli Phillips wrote: Reason for CRM: Patient would like to confirm if the CPAP machine will be sent in now that she has an upcoming appt. Please call patient to confirm.

## 2024-04-27 NOTE — Telephone Encounter (Unsigned)
 Copied from CRM #[id number]. Topic: Clinical - Order For Equipment >> Apr 27, 2024 12:03 PM Celestine F wrote: Reason for CRM: Pt wanted to relay that she doesn't want a CPAP machine or supplies and to cancel any order involving it. She stated multiple times she doesn't believe she needs that.   Pt's phone number is [phone].

## 2024-04-28 DIAGNOSIS — F419 Anxiety disorder, unspecified: Secondary | ICD-10-CM

## 2024-04-28 DIAGNOSIS — F29 Unspecified psychosis not due to a substance or known physiological condition: Secondary | ICD-10-CM | POA: Diagnosis not present

## 2024-04-28 MED ORDER — ACETAMINOPHEN 500 MG PO TABS
1000.0000 mg | ORAL_TABLET | Freq: Once | ORAL | Status: AC
  Administered 2024-04-28: 1000 mg via ORAL
  Filled 2024-04-28: qty 2

## 2024-04-28 MED ORDER — ALBUTEROL SULFATE (2.5 MG/3ML) 0.083% IN NEBU
2.5000 mg | INHALATION_SOLUTION | Freq: Four times a day (QID) | RESPIRATORY_TRACT | Status: DC | PRN
  Filled 2024-04-28: qty 3

## 2024-04-28 MED ORDER — UMECLIDINIUM-VILANTEROL 62.5-25 MCG/ACT IN AEPB
1.0000 | INHALATION_SPRAY | Freq: Every day | RESPIRATORY_TRACT | Status: DC
  Administered 2024-04-28 – 2024-04-29 (×2): 1 via RESPIRATORY_TRACT
  Filled 2024-04-28: qty 14

## 2024-04-28 NOTE — Progress Notes (Signed)
   04/28/24 1430  Spiritual Encounters  Type of Visit Initial  Care provided to: Patient  Conversation partners present during encounter Nurse  Reason for visit Routine spiritual support  OnCall Visit No   Chaplain visited patient while on the Unit.  Patient shared some concerns taking place at her home that are concerning to her.  Chaplain listened and confirmed with patient she felt safe.  Chaplain shared concerns with staff regarding this issue and also asked about patient's mental health as Chaplain wondered why the patient needed care on a BH Unit.  Chaplain encouraged the patient and patient shared that she's grounded in her faith.  Chaplain offered prayer to the patient and they prayed together.    Rev. Rana M. Nicholaus, M.Div. Chaplain Resident [facility]

## 2024-04-28 NOTE — BH Assessment (Signed)
 TTS has made several attempts to contact patient's landlord [phone] & [phone]) to obtain collateral information. However, he was unable to reach anyone and the numbers didn't have an option to leave a voicemail message to request a return phone call.

## 2024-04-28 NOTE — ED Notes (Signed)
 Pt taking a shower

## 2024-04-28 NOTE — ED Notes (Signed)
vol/psych consult ordered/pending.. 

## 2024-04-28 NOTE — ED Notes (Signed)
 Pt given breakfast.
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
## 2024-04-28 NOTE — ED Notes (Signed)
 Snacks provided to pt

## 2024-04-28 NOTE — ED Notes (Signed)
 Lunch tray provided to pt.

## 2024-04-28 NOTE — ED Notes (Signed)
 VOL/  PENDING  CONSULT

## 2024-04-28 NOTE — ED Provider Notes (Signed)
   [facility] Observation Note   ----------------------------------------- 9:21 AM on 04/28/2024 -----------------------------------------  Kelli Phillips is a 73 y.o. female currently in the emergency department awaiting psychiatric evaluation and disposition.  Patient remains here voluntarily.  No acute events overnight  Recent Vitals   Most recent vital signs: Vitals:   04/27/24 2158 04/28/24 0846  BP: (!) 140/69 (!) 151/81  Pulse: 76 72  Resp: 18 17  Temp: 97.6 F (36.4 C) 97.9 F (36.6 C)  SpO2: 93% 93%    ED Results / Procedures / Treatments   Labs (all labs ordered are listed, but only abnormal results are displayed) Labs Reviewed  COMPREHENSIVE METABOLIC PANEL WITH GFR - Abnormal; Notable for the following components:      Result Value   Glucose, Bld 105 (*)    AST 100 (*)    All other components within normal limits  URINALYSIS, W/ REFLEX TO CULTURE (INFECTION SUSPECTED) - Abnormal; Notable for the following components:   Color, Urine YELLOW (*)    APPearance CLEAR (*)    Hgb urine dipstick SMALL (*)    Leukocytes,Ua TRACE (*)    All other components within normal limits  ETHANOL  CBC  URINE DRUG SCREEN, QUALITATIVE ([facility] ONLY)    MEDICATIONS ORDERED IN ED: Medications  ibuprofen  (ADVIL ) tablet 600 mg (600 mg Oral Given 04/28/24 0815)  ondansetron  (ZOFRAN ) tablet 4 mg (has no administration in time range)  alum & mag hydroxide-simeth (MAALOX/MYLANTA) 200-200-20 MG/5ML suspension 30 mL (has no administration in time range)  albuterol  (PROVENTIL ) (2.5 MG/3ML) 0.083% nebulizer solution 2.5 mg (has no administration in time range)  umeclidinium-vilanterol (ANORO ELLIPTA ) 62.5-25 MCG/ACT 1 puff (has no administration in time range)     ED Plan   Currently awaiting psychiatric evaluation for disposition.  Remains here voluntarily.    Dorothyann Drivers, MD 04/28/24 [phone]
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
## 2024-04-29 DIAGNOSIS — F29 Unspecified psychosis not due to a substance or known physiological condition: Secondary | ICD-10-CM | POA: Diagnosis not present

## 2024-04-29 NOTE — ED Provider Notes (Signed)
 The patient has been seen evaluated by psychiatry.  Dr. Mandaram, has seen the patient in conjunction with TTS.  They are advising the patient may be discharged to follow-up outpatient.  Feel the patient likely suffering from associated symptoms of anxiety.  No immediate risk to herself or others.  Plan to discharge the patient with resource recommendations as psychiatry has noted.  Final note by psychiatrist has not yet been completed/available for review but direct verbal recommendations have been received for discharge from  Dr. Grover Dicky Anes, MD 04/29/24 1228

## 2024-04-29 NOTE — ED Notes (Signed)
Taxi called for pt.

## 2024-04-29 NOTE — ED Notes (Signed)
 Pt provided with breakfast tray.

## 2024-04-29 NOTE — ED Notes (Addendum)
 Pt provided with shower supplies and clean scrubs.

## 2024-04-29 NOTE — ED Notes (Signed)
 Pt received lunch tray

## 2024-04-29 NOTE — ED Provider Notes (Signed)
   [facility] Observation Note   ----------------------------------------- 9:21 AM on 04/28/2024 -----------------------------------------  Kelli Phillips is a 73 y.o. female currently in the emergency department awaiting psychiatric evaluation and disposition.    No acute events overnight.  TTS has made several attempts to reach landlord but unsuccessful to this point  Recent Vitals   Most recent vital signs: Vitals:   04/28/24 1149 04/28/24 1618  BP: (!) 145/82 (!) 150/86  Pulse: 83 75  Resp:  17  Temp:  (!) 97.5 F (36.4 C)  SpO2:  94%    Currently sleeping comfortably without distress  ED Results / Procedures / Treatments   ED Plan   Currently awaiting psychiatric evaluation for disposition.  Remains here voluntarily.   Dicky Anes, MD 04/29/24 [phone]

## 2024-04-29 NOTE — Discharge Instructions (Addendum)
You have been seen in the Emergency Department (ED) today for a psychiatric complaint.  You have been evaluated by psychiatry and we believe you are safe to be discharged from the hospital.   ° °Please return to the ED immediately if you have ANY thoughts of hurting yourself or anyone else, so that we may help you. ° °Please avoid alcohol and drug use. ° °Follow up with your doctor and/or therapist as soon as possible regarding today's ED visit.   Please follow up any other recommendations and clinic appointments provided by the psychiatry team that saw you in the Emergency Department. ° °

## 2024-04-29 NOTE — ED Notes (Signed)
 This tech gave the pt yellow socks, per pt request, at this time.

## 2024-04-29 NOTE — ED Notes (Signed)
 Pt stated that her head hurts and wants meds, this tech notified RN.

## 2024-05-02 ENCOUNTER — Telehealth: Payer: Self-pay

## 2024-05-02 ENCOUNTER — Encounter: Payer: Self-pay | Admitting: Student in an Organized Health Care Education/Training Program

## 2024-05-02 ENCOUNTER — Ambulatory Visit (INDEPENDENT_AMBULATORY_CARE_PROVIDER_SITE_OTHER): Admitting: Student in an Organized Health Care Education/Training Program

## 2024-05-02 VITALS — BP 110/70 | HR 76 | Temp 97.1°F | Ht 65.0 in | Wt 110.9 lb

## 2024-05-02 DIAGNOSIS — J449 Chronic obstructive pulmonary disease, unspecified: Secondary | ICD-10-CM

## 2024-05-02 DIAGNOSIS — J9611 Chronic respiratory failure with hypoxia: Secondary | ICD-10-CM | POA: Diagnosis not present

## 2024-05-02 MED ORDER — BEVESPI AEROSPHERE 9-4.8 MCG/ACT IN AERO: INHALATION_SPRAY | RESPIRATORY_TRACT | 5 refills | Status: AC

## 2024-05-02 MED ORDER — ALBUTEROL SULFATE HFA 108 (90 BASE) MCG/ACT IN AERS: 2.0000 | INHALATION_SPRAY | Freq: Four times a day (QID) | RESPIRATORY_TRACT | 5 refills | Status: DC | PRN

## 2024-05-02 NOTE — Telephone Encounter (Signed)
 Patient was seen in the office today. Dr. Isadora has ordered Ohtuvayre  for the patient.  She has signed the form and it has been faxed to the pharmacy team for completion.

## 2024-05-03 NOTE — Progress Notes (Signed)
 Acute Visit - Primary Pulmonologist: Dr. Isaiah  Assessment & Plan:   #COPD GOLD 4-B #Chronic Hypoxic Respiratory Failure  Patient is a 73 year old female with a past medical history of chronic hypoxic respiratory failure secondary to Gold 4B COPD presenting for an acute visit to refill her inhalers.  Her symptoms appear to be at baseline but she has had multiple no-shows recently.  She is using her oxygen  therapy at night and with exertion but not consistently.  She is currently maintained on LABA/LAMA therapy with Bevespi  and with a good response.  She is enrolled in our lung cancer screening program with her most recent CT from February 2025 returning reassuring with a 1 year follow-up scan recommended.  -Recommend immunizations, patient prefers to obtain those at her pharmacy - albuterol  (VENTOLIN  HFA) 108 (90 Base) MCG/ACT inhaler; Inhale 2 puffs into the lungs every 6 (six) hours as needed for wheezing or shortness of breath.  Dispense: 8 g; Refill: 5 - Glycopyrrolate -Formoterol  (BEVESPI  AEROSPHERE) 9-4.8 MCG/ACT AERO; INHALE 2 PUFFS INTO THE LUNGS TWICE A DAY  Dispense: 10.7 each; Refill: 5 - Continue Lung Cancer Screening    Return in about 3 months (around 08/01/2024).  Follow-up with Dr Isaiah  I spent 32 minutes caring for this patient today, including preparing to see the patient, obtaining a medical history , reviewing a separately obtained history, performing a medically appropriate examination and/or evaluation, counseling and educating the patient/family/caregiver, ordering medications, tests, or procedures, documenting clinical information in the electronic health record, and independently interpreting results (not separately reported/billed) and communicating results to the patient/family/caregiver  Belva November, MD [facility]   End of visit medications:  Meds ordered this encounter  Medications   albuterol  (VENTOLIN  HFA) 108 (90 Base) MCG/ACT  inhaler    Sig: Inhale 2 puffs into the lungs every 6 (six) hours as needed for wheezing or shortness of breath.    Dispense:  8 g    Refill:  5    J 43.9 PATIENT REQUESTS VENTOLIN .   Glycopyrrolate -Formoterol  (BEVESPI  AEROSPHERE) 9-4.8 MCG/ACT AERO    Sig: INHALE 2 PUFFS INTO THE LUNGS TWICE A DAY    Dispense:  10.7 each    Refill:  5    DX Code Needed  .     Current Outpatient Medications:    cyanocobalamin  (VITAMIN B12) 1000 MCG tablet, Take 2 tablets (2,000 mcg total) by mouth daily., Disp: 90 tablet, Rfl: 3   albuterol  (VENTOLIN  HFA) 108 (90 Base) MCG/ACT inhaler, Inhale 2 puffs into the lungs every 6 (six) hours as needed for wheezing or shortness of breath., Disp: 8 g, Rfl: 5   Glycopyrrolate -Formoterol  (BEVESPI  AEROSPHERE) 9-4.8 MCG/ACT AERO, INHALE 2 PUFFS INTO THE LUNGS TWICE A DAY, Disp: 10.7 each, Rfl: 5   risperiDONE (RISPERDAL) 0.5 MG tablet, Take 0.5 mg by mouth at bedtime. (Patient not taking: Reported on 05/02/2024), Disp: , Rfl:    Vitamin D , Ergocalciferol , (DRISDOL ) 1.25 MG (50000 UNIT) CAPS capsule, Take 1 capsule (50,000 Units total) by mouth every 7 (seven) days. (Patient not taking: Reported on 05/02/2024), Disp: 12 capsule, Rfl: 1   Subjective:   PATIENT ID: Kelli Phillips GENDER: female DOB: 07/23/1951, MRN: [id number]  Chief Complaint  Patient presents with   Medical Management of Chronic Issues    Feeling better now. She was sneezing, coughing and had nasal drainage.     HPI  Patient is a 73 year old female with a past medical history of severe COPD followed by Dr.  Kasa presenting to clinic for an acute visit.  Patient was last seen by Dr. Isaiah in December 2023 and subsequently seen by our nurse practitioners.  She has had a few of COPD exacerbations in the past but non recently.  Today, she reports some increase in her shortness of breath but otherwise feels at baseline.  She is here to request refills on her inhalers.  She does not have any increase in  her cough, sputum production, or wheezing.  She is able to walk without any limitation.  When asked to quantify her symptoms on a scale of 1-10, 10 being the best, she reported being at 10.  Patient is prescribed oxygen  therapy but she only uses it at night and with severe exertion.  She is currently using Bevespi  with good response.  She does not have to use her rescue inhalers much.  She is enrolled in our lung cancer screening program.  She has a history of smoking, but quit in 2009 with around 40 pack years of smoking history.  She previously worked in [employer].  Previous PFT from 2020 showed an FEV1 of 0.68 L (29% predicted) with a ratio of 0.41.  Ancillary information including prior medications, full medical/surgical/family/social histories, and PFTs (when available) are listed below and have been reviewed.   Review of Systems  Constitutional:  Negative for chills, fever and weight loss.  Respiratory:  Positive for cough and shortness of breath. Negative for hemoptysis, sputum production and wheezing.   Cardiovascular:  Negative for chest pain.     Objective:   Vitals:   05/02/24 1541  BP: 110/70  Pulse: 76  Temp: (!) 97.1 F (36.2 C)  SpO2: 93%  Weight: 110 lb 13.9 oz (50.3 kg)  Height: 5' 5 (1.651 m)   93% on RA  BMI Readings from Last 3 Encounters:  05/02/24 18.45 kg/m  03/02/24 19.80 kg/m  02/18/24 20.90 kg/m   Wt Readings from Last 3 Encounters:  05/02/24 110 lb 13.9 oz (50.3 kg)  03/02/24 119 lb (54 kg)  02/18/24 118 lb (53.5 kg)    Physical Exam Constitutional:      Appearance: Normal appearance. She is not ill-appearing.  Cardiovascular:     Rate and Rhythm: Normal rate and regular rhythm.     Pulses: Normal pulses.     Heart sounds: Normal heart sounds.  Pulmonary:     Effort: Pulmonary effort is normal.     Breath sounds: Normal breath sounds. No wheezing or rales.     Comments: Decreased air entry bilaterally Musculoskeletal:     Right  lower leg: No edema.     Left lower leg: No edema.  Neurological:     General: No focal deficit present.     Mental Status: She is alert and oriented to person, place, and time. Mental status is at baseline.       Ancillary Information    Past Medical History:  Diagnosis Date   Allergy    Arthritis    Cancer (HCC)    skin cancer on lip   COPD (chronic obstructive pulmonary disease) (HCC)    Emphysema lung (HCC)    Frequent headaches    GERD (gastroesophageal reflux disease)    History of chicken pox    Urine incontinence      Family History  Problem Relation Age of Onset   Hypertension Mother    COPD Mother    Heart failure Mother    Cancer Father  Alcohol abuse Father    Colon cancer Father    Heart failure Sister    Heart attack Brother 32   Lung cancer Brother    Breast cancer Paternal Aunt        ?   Heart failure Maternal Grandmother      Past Surgical History:  Procedure Laterality Date   CESAREAN SECTION     COLON SURGERY      Social History   Socioeconomic History   Marital status: Divorced    Spouse name: Not on file   Number of children: Not on file   Years of education: Not on file   Highest education level: Not on file  Occupational History   Not on file  Tobacco Use   Smoking status: Former    Current packs/day: 0.00    Average packs/day: 1 pack/day for 43.0 years (43.0 ttl pk-yrs)    Types: Cigarettes    Start date: 34    Quit date: 2009    Years since quitting: 16.6   Smokeless tobacco: Never  Vaping Use   Vaping status: Never Used  Substance and Sexual Activity   Alcohol use: No   Drug use: No   Sexual activity: Yes    Birth control/protection: Post-menopausal  Other Topics Concern   Not on file  Social History Narrative   Not on file   Social Drivers of Health   Financial Resource Strain: Low Risk  (02/18/2024)   Overall Financial Resource Strain (CARDIA)    Difficulty of Paying Living Expenses: Not hard at all   Food Insecurity: No Food Insecurity (02/18/2024)   Hunger Vital Sign    Worried About Running Out of Food in the Last Year: Never true    Ran Out of Food in the Last Year: Never true  Transportation Needs: No Transportation Needs (02/18/2024)   PRAPARE - Administrator, Civil Service (Medical): No    Lack of Transportation (Non-Medical): No  Physical Activity: Insufficiently Active (02/18/2024)   Exercise Vital Sign    Days of Exercise per Week: 3 days    Minutes of Exercise per Session: 20 min  Stress: No Stress Concern Present (02/18/2024)   [employer] of Occupational Health - Occupational Stress Questionnaire    Feeling of Stress: Only a little  Social Connections: Moderately Isolated (02/18/2024)   Social Connection and Isolation Panel    Frequency of Communication with Friends and Family: More than three times a week    Frequency of Social Gatherings with Friends and Family: More than three times a week    Attends Religious Services: More than 4 times per year    Active Member of [employer] or Organizations: No    Attends Banker Meetings: Never    Marital Status: Divorced  Catering manager Violence: Not At Risk (02/18/2024)   Humiliation, Afraid, Rape, and Kick questionnaire    Fear of Current or Ex-Partner: No    Emotionally Abused: No    Physically Abused: No    Sexually Abused: No     Allergies  Allergen Reactions   Morphine And Codeine Other (See Comments)   Penicillins Rash   Sulfa Antibiotics Rash     CBC    Component Value Date/Time   WBC 5.5 04/27/2024 1649   RBC 4.87 04/27/2024 1649   HGB 14.5 04/27/2024 1649   HGB 14.7 09/04/2014 1811   HCT 43.8 04/27/2024 1649   HCT 46.2 09/04/2014 1811   PLT 195 04/27/2024 1649  PLT 194 09/04/2014 1811   MCV 89.9 04/27/2024 1649   MCV 87 09/04/2014 1811   MCH 29.8 04/27/2024 1649   MCHC 33.1 04/27/2024 1649   RDW 12.8 04/27/2024 1649   RDW 14.4 09/04/2014 1811   LYMPHSABS 1.3 03/02/2024  1843   LYMPHSABS 1.4 09/04/2014 1811   MONOABS 0.5 03/02/2024 1843   MONOABS 0.3 09/04/2014 1811   EOSABS 0.1 03/02/2024 1843   EOSABS 0.0 09/04/2014 1811   BASOSABS 0.1 03/02/2024 1843   BASOSABS 0.0 09/04/2014 1811    Pulmonary Functions Testing Results:     No data to display          Outpatient Medications Prior to Visit  Medication Sig Dispense Refill   cyanocobalamin  (VITAMIN B12) 1000 MCG tablet Take 2 tablets (2,000 mcg total) by mouth daily. 90 tablet 3   albuterol  (VENTOLIN  HFA) 108 (90 Base) MCG/ACT inhaler Inhale 2 puffs into the lungs every 6 (six) hours as needed for wheezing or shortness of breath. 18 g 5   BEVESPI  AEROSPHERE 9-4.8 MCG/ACT AERO INHALE 2 PUFFS INTO THE LUNGS TWICE A DAY 10.7 each 5   risperiDONE (RISPERDAL) 0.5 MG tablet Take 0.5 mg by mouth at bedtime. (Patient not taking: Reported on 05/02/2024)     Vitamin D , Ergocalciferol , (DRISDOL ) 1.25 MG (50000 UNIT) CAPS capsule Take 1 capsule (50,000 Units total) by mouth every 7 (seven) days. (Patient not taking: Reported on 05/02/2024) 12 capsule 1   No facility-administered medications prior to visit.

## 2024-05-04 ENCOUNTER — Ambulatory Visit: Payer: Self-pay | Admitting: Internal Medicine

## 2024-05-04 NOTE — Telephone Encounter (Signed)
 Noted. Nothing further needed.

## 2024-05-04 NOTE — Telephone Encounter (Signed)
 Patient originally called stating that pieces of her home oxygen  were missing and pieces of her portable oxygen ---patient very anxious and upset and very anxious EMS and Law Enforcement at home with patient assisting her who were familiar with her and her mental health history   FYI Only or Action Required?: FYI only for provider.  Patient is followed in Pulmonology for COPD, last seen on 05/02/2024 by Isadora Hose, MD.  Called Nurse Triage reporting Anxiety.  Symptoms began today.  Interventions attempted: Nothing.  Symptoms are: unchanged.  Triage Disposition: Go to ED Now (or PCP Triage)  Patient/caregiver understands and will follow disposition?: No, doesn't understand advice--patient did allow EMS to come assist her with her oxygen  equipment and law enforcement to come             Copied from CRM #[id number]. Topic: Clinical - Red Word Triage >> May 04, 2024  9:50 AM Celestine FALCON wrote: Red Word that prompted transfer to Nurse Triage: Device Malfunction; patient stated she came home and her breathing treatment machine (I believe it is her nebulizer) is missing pieces. She is very upset so getting clear details is difficulty, but she is stating that the tube has missing pieces/been tampered with, the bag is missing, and the piece where you place your teeth is having pieces missing as well. She could not recall the supplier she got the machine with. Pt is wanting someone to come to her home to check on the machine and/or replace it.   Pt's phone number is [phone], and she sees Dr. Isaiah and Dr. Isadora at the [facility]. Appt is scheduled for 08/09/2024 at 1115 am with Dr. Isaiah. Reason for Disposition  Patient sounds very sick or weak to the triager  Answer Assessment - Initial Assessment Questions Patient stayed with a friend for two nights and came home  She states that she has some pieces missing from her Oxygen  and the brown bag you carry the oxygen  with She states  that a metal piece from the oxygen  tank is missing Patient isn't sure if her neighbor got into her home and she has issues in the past with this neighbor Patient initially stated that she is on Oxygen  all the time but then said she   Patient very anxious and saying that her neighbor and some other people came in her yard last night Patient states her son is in the hospital so she knows he has not been in the house  This RN offered to call law enforcement to come to her home to assist her and she refused. Patient states she is going to her friends house and then going for lunch. Patient states she feels safe and she has dogs with her Patient states that she is fine Patient very talkative and anxious about  Patient agreed to let an ambulance come to try to help her with her Oxygen   Called 911 for an ambulance to come at 10:32am They were familiar with this patient and a Hydrographic surveyor was coming with the ambulance  With patient's mental health history and her current state of high anxiety and oxygen  equipment problem, emergency services were deemed necessary Patient checking her locks and talking constantly about this situation Patient talked about her neighbor  Emergency Personnel arrived a 10:51am Emergency Personnel on scene assisting patient     1. CONCERN: Did anything happen that prompted you to call today?      Patient was calling because pieces were missing from her  oxygen  machine---the home one and her portable machine--she wanted someone to come fix it & she states she thinks that someone possibly got into her home and she doesn't understand why they would take pieces off of her machine  4. SEVERITY: How would you rate the level of anxiety? (e.g., 0 - 10; or mild, moderate, severe).     Patient very upset and anxious and talking constantly 5. FUNCTIONAL IMPAIRMENT: How have these feelings affected your ability to do daily activities? Have you had more difficulty  than usual doing your normal daily activities? (e.g., getting better, same, worse; self-care, school, work, interactions)     Patient states she stayed with a friend for a few nights 6. HISTORY: Have you felt this way before? Have you ever been diagnosed with an anxiety problem in the past? (e.g., generalized anxiety disorder, panic attacks, PTSD). If Yes, ask: How was this problem treated? (e.g., medicines, counseling, etc.)     This has happened before 7. RISK OF HARM - SUICIDAL IDEATION: Do you ever have thoughts of hurting or killing yourself? If Yes, ask:  Do you have these feelings now? Do you have a plan on how you would do this?     Patient states she is fine and she states that she is safe, has her dogs, is happy every day, and she denies any thoughts about hurting herself 9. THERAPIST: Do you have a counselor or therapist? If Yes, ask: What is their name?     Patient states [facility] is a place she has stayed before 10. POTENTIAL TRIGGERS: Do you drink caffeinated beverages (e.g., coffee, colas, teas), and how much daily? Do you drink alcohol or use any drugs? Have you started any new medicines recently?       N/a 11. PATIENT SUPPORT: Who is with you now? Who do you live with? Do you have family or friends who you can talk to?        Nobody but her dogs 12. OTHER SYMPTOMS: Do you have any other symptoms? (e.g., feeling depressed, trouble concentrating, trouble sleeping, trouble breathing, palpitations or fast heartbeat, chest pain, sweating, nausea, or diarrhea)       Patient states that she is fine at this time  Protocols used: Anxiety and Panic Attack-A-AH

## 2024-05-04 NOTE — Telephone Encounter (Signed)
 I spoke with the patient and gave her the number to [employer]. I told her to call them about coming out to look at her concentrator.   Nothing further needed.

## 2024-05-04 NOTE — Telephone Encounter (Signed)
 FYI Only or Action Required?: Action required by provider: needing assistance with oxygen  concentrator and nebulizer.  Patient is followed in Pulmonology for COPD, last seen on 05/02/2024 by Kelli Hose, MD.  Called Nurse Triage reporting Advice Only and Device Malfunction.  EMS was called earlier due to anxiety and her oxygen  equipment. Patient states EMS didn't look at her equipment saying they couldn't do anything. Patient can't find the information of who supplied the concentrator and unable to find information in chart. Asking for assistance from office. Patient would like a call back. Asymptomatic currently  Triage Disposition: Call PCP Within 24 Hours  Patient/caregiver understands and will follow disposition?:   Copied from CRM #[id number]. Topic: Clinical - Red Word Triage >> May 04, 2024  9:50 AM Kelli Phillips wrote: Red Word that prompted transfer to Nurse Triage: Device Malfunction; patient stated she came home and her breathing treatment machine (I believe it is her nebulizer) is missing pieces. She is very upset so getting clear details is difficulty, but she is stating that the tube has missing pieces/been tampered with, the bag is missing, and the piece where you place your teeth is having pieces missing as well. She could not recall the supplier she got the machine with. Pt is wanting someone to come to her home to check on the machine and/or replace it.   Pt's phone number is [phone], and she sees Dr. Isaiah and Dr. Isadora at the [facility]. Appt is scheduled for 08/09/2024 at 1115 am with Dr. Isaiah. >> May 04, 2024  3:57 PM Kelli Phillips wrote: Patient [phone] states is having trouble with her oxygen  machine and buttons is missing pieces. Patient states no one would touch her stuffs. Patient was speaking with nurse triage earlier today. Patient states she does have Alzheimer's disease and wants to speak with the nurse again. Patient wants a new oxygen  machine. Patient sees Dr.  Isaiah. Please advise.  Reason for Disposition  [1] Caller requests to speak ONLY to PCP AND [2] NON-URGENT question  Answer Assessment - Initial Assessment Questions 1. REASON FOR CALL or QUESTION: What is your reason for calling today? or How can I best     Patient is having issues with her oxygen  concentrator and nebulizer machine. Patient states the oxygen  concentrator is making noises and not working. Patient states she is unsure of who supplied the concentrator. Patient is trying to find the information. Patient is asking for any assistance from office with information about her oxygen  supplier. No shortness of breath, cough or chest pain. Patient is talking in full sentences without any issue. 2. CALLER: Document the source of call. (e.g., laboratory staff, caregiver or patient).     Patient  Protocols used: PCP Call - No Triage-A-AH

## 2024-05-06 ENCOUNTER — Other Ambulatory Visit: Payer: Self-pay

## 2024-05-06 ENCOUNTER — Emergency Department

## 2024-05-06 ENCOUNTER — Emergency Department
Admission: EM | Admit: 2024-05-06 | Discharge: 2024-05-06 | Disposition: A | Discharge status: Home / Self Care | Attending: Emergency Medicine | Admitting: Emergency Medicine

## 2024-05-06 DIAGNOSIS — J441 Chronic obstructive pulmonary disease with (acute) exacerbation: Secondary | ICD-10-CM | POA: Diagnosis not present

## 2024-05-06 DIAGNOSIS — R0602 Shortness of breath: Secondary | ICD-10-CM | POA: Diagnosis present

## 2024-05-06 DIAGNOSIS — J181 Lobar pneumonia, unspecified organism: Secondary | ICD-10-CM | POA: Insufficient documentation

## 2024-05-06 DIAGNOSIS — J189 Pneumonia, unspecified organism: Secondary | ICD-10-CM

## 2024-05-06 DIAGNOSIS — Z85828 Personal history of other malignant neoplasm of skin: Secondary | ICD-10-CM | POA: Diagnosis not present

## 2024-05-06 MED ORDER — PREDNISONE 20 MG PO TABS
40.0000 mg | ORAL_TABLET | ORAL | Status: AC
  Administered 2024-05-06: 40 mg via ORAL
  Filled 2024-05-06: qty 2

## 2024-05-06 MED ORDER — DOXYCYCLINE HYCLATE 100 MG PO TABS
100.0000 mg | ORAL_TABLET | Freq: Once | ORAL | Status: AC
  Administered 2024-05-06: 100 mg via ORAL
  Filled 2024-05-06: qty 1

## 2024-05-06 MED ORDER — DOXYCYCLINE HYCLATE 100 MG PO CAPS
100.0000 mg | ORAL_CAPSULE | Freq: Two times a day (BID) | ORAL | 0 refills | Status: AC
Start: 2024-05-06 — End: 2024-05-16

## 2024-05-06 MED ORDER — PREDNISONE 20 MG PO TABS: 40.0000 mg | ORAL_TABLET | Freq: Every day | ORAL | 0 refills | Status: AC

## 2024-05-06 NOTE — ED Provider Notes (Signed)
 [facility] Provider Note    Event Date/Time   First MD Initiated Contact with Patient 05/06/24 1541     (approximate)   History   Chief Complaint: Shortness of Breath   HPI  Kelli Phillips is a 73 y.o. female with a history of emphysema, GERD who comes ED complaining of shortness of breath.  Also has some increased nonproductive cough.  No fever chills or sweats.  Reports that she has been short of breath since yesterday.  She had noticed that her home oxygen  concentrator was malfunctioning.  She called device representative who is having a new one delivered on Monday, 2 days from now.  In the meantime patient tried her home inhaler without relief of symptoms.  She does report reluctance to use her home inhaler because she does not like the bad taste and sore throat that she often gets as a side effect.  Denies chest pain.        Past Medical History:  Diagnosis Date   Allergy    Arthritis    Cancer (HCC)    skin cancer on lip   COPD (chronic obstructive pulmonary disease) (HCC)    Emphysema lung (HCC)    Frequent headaches    GERD (gastroesophageal reflux disease)    History of chicken pox    Urine incontinence     Current Outpatient Rx   Order #: [id number] Class: Normal   Order #: [id number] Class: Normal   Order #: [id number] Class: Normal   Order #: [id number] Class: Normal   Order #: [id number] Class: Normal   Order #: [id number] Class: Historical Med   Order #: [id number] Class: Normal    Past Surgical History:  Procedure Laterality Date   CESAREAN SECTION     COLON SURGERY      Physical Exam   Triage Vital Signs: ED Triage Vitals  Encounter Vitals Group     BP 05/06/24 1521 (!) 148/81     Girls Systolic BP Percentile --      Girls Diastolic BP Percentile --      Boys Systolic BP Percentile --      Boys Diastolic BP Percentile --      Pulse Rate 05/06/24 1521 84     Resp 05/06/24 1521 (!) 27     Temp 05/06/24 1521 97.8 F (36.6 C)      Temp Source 05/06/24 1521 Oral     SpO2 05/06/24 1521 96 %     Weight 05/06/24 1524 110 lb (49.9 kg)     Height 05/06/24 1524 5' 5 (1.651 m)     Head Circumference --      Peak Flow --      Pain Score 05/06/24 1522 0     Pain Loc --      Pain Education --      Exclude from Growth Chart --     Most recent vital signs: Vitals:   05/06/24 1600 05/06/24 1730  BP:  136/77  Pulse: 83 81  Resp: 16 (!) 28  Temp:    SpO2: 95% 95%    General: Awake, no distress.  CV:  Good peripheral perfusion.  Regular rate rhythm Resp:  Normal effort.  Quiet lung sounds diffusely, prolonged expiratory phase, mild expiratory wheezing Abd:  No distention.  Soft nontender Other:  No lower extremity edema.  Moist oral mucosa   ED Results / Procedures / Treatments   Labs (all labs ordered are listed, but only abnormal results are displayed) Labs Reviewed -  No data to display   EKG    RADIOLOGY Chest x-ray interpreted by me, no pneumothorax.  Small opacity in the right lower lung.  Radiology report reviewed   PROCEDURES:  Procedures   MEDICATIONS ORDERED IN ED: Medications  predniSONE  (DELTASONE ) tablet 40 mg (40 mg Oral Given 05/06/24 1604)  doxycycline  (VIBRA -TABS) tablet 100 mg (100 mg Oral Given 05/06/24 1743)     IMPRESSION / MDM / ASSESSMENT AND PLAN / ED COURSE  I reviewed the triage vital signs and the nursing notes.  DDx: COPD exacerbation, pneumonia, pneumothorax, pleural effusion  Patient's presentation is most consistent with acute presentation with potential threat to life or bodily function.  Patient presents with shortness of breath and cough.  She is nontoxic, speaking in sentences, lung exam nonfocal.  Chest x-ray suggestive of a mild developing right basilar pneumonia.  Doxycycline  and prednisone  given, she feels better and is stable for continuing her home medications with the addition of a course of doxycycline  and prednisone , and following up with her doctor  outpatient.  Doubt ACS PE dissection.  Not septic.   Clinical Course as of 05/06/24 2239  Sat May 06, 2024  1734 Feeling better. Cxr shows possible developing pna. VS normal currently. Will continue on pred and doxy, f/u pcp [PS]    Clinical Course User Index [PS] Viviann Pastor, MD     FINAL CLINICAL IMPRESSION(S) / ED DIAGNOSES   Final diagnoses:  COPD exacerbation (HCC)  Community acquired pneumonia of right lower lobe of lung     Rx / DC Orders   ED Discharge Orders          Ordered    doxycycline  (VIBRAMYCIN ) 100 MG capsule  2 times daily        05/06/24 1734    predniSONE  (DELTASONE ) 20 MG tablet  Daily with breakfast        05/06/24 1734             Note:  This document was prepared using Dragon voice recognition software and may include unintentional dictation errors.   Viviann Pastor, MD 05/06/24 2239

## 2024-05-06 NOTE — ED Triage Notes (Signed)
 Pt to ED via [facility] from home for c/o SOB. Pt has hx COPD, HTN, anxiety. Pt A&O, ambulatory to room. Breathing labored.O2 95% on room air. Given Duo Neb by EMS.

## 2024-05-08 ENCOUNTER — Telehealth: Payer: Self-pay

## 2024-05-08 NOTE — Telephone Encounter (Signed)
 Copied from CRM [id number]. Topic: General - Other >> May 08, 2024  2:58 PM [place] S wrote: Reason for CRM: please call and provide patient with the name and number of her dme company. They are delivering her equipment today.

## 2024-05-08 NOTE — Telephone Encounter (Signed)
 [employer]  paperwork received by pharmacy team, will conduct BIV in separate encounter.

## 2024-05-08 NOTE — Telephone Encounter (Signed)
 Received [employer]  new start paperwork. Completed form and faxed with clinicals and insurance card copy to [facility]   Phone#: [phone] Fax#: [phone]

## 2024-05-08 NOTE — Telephone Encounter (Signed)
 Received fax from [employer] confirming receipt of [employer]  enrollment form  Patient ID: [id number]

## 2024-05-09 ENCOUNTER — Telehealth: Payer: Self-pay

## 2024-05-09 ENCOUNTER — Ambulatory Visit: Payer: Self-pay | Admitting: Internal Medicine

## 2024-05-09 DIAGNOSIS — J9611 Chronic respiratory failure with hypoxia: Secondary | ICD-10-CM

## 2024-05-09 DIAGNOSIS — J449 Chronic obstructive pulmonary disease, unspecified: Secondary | ICD-10-CM

## 2024-05-09 NOTE — Telephone Encounter (Addendum)
 I spoke with the patient. She said she has been to the ED and diagnosed with pneumonia. She said she feels better now. She said [employer] came out and brought her a new concentrator.   See second message from today regarding the POC.  Nothing further needed.

## 2024-05-09 NOTE — Telephone Encounter (Signed)
 Received fax from [employer] with summary of benefits. Referral form for [employer]  received. Rx will be triaged to [facility].. Once benefits investigation completed, pharmacy will reach out the patient to schedule shipment. If medication is unaffordable, patient will need to express financial hardship to be referred back to [place] [facility] for patient assistance program pre-screening.   Patient ID: [id number] Pharmacy phone: [phone] [place] [facility] Phone#: [phone]

## 2024-05-09 NOTE — Telephone Encounter (Signed)
 Copied from CRM [id number]. Topic: Clinical - Order For Equipment >> May 09, 2024 12:14 PM Joesph PARAS wrote: Reason for CRM: Patient states she needed a portable oxygen  concentrator because she cannot use the big ones. She states that was supposed to be sent in but company states a prescription was placed for it. Patient would like order to be placed so she can get it. Does not know name of company. Patient continues to talk over PAS, despite PAS attempting to explain process of steps going forward.

## 2024-05-09 NOTE — Telephone Encounter (Signed)
 I spoke with the patient. She said she is wanting to get a POC.  Per Avelina from [employer]- She states she doesn't have a POC. Branch says we need either an order with POC pulse dose using the O2 template or an order to do a POC EVAL in our office entered by the provider.   Dr. Isaiah, okay to order POC?

## 2024-05-09 NOTE — Telephone Encounter (Signed)
 ATC the patient. Her mailbox was full so I could not leave a message. I will try again later.

## 2024-05-09 NOTE — Telephone Encounter (Signed)
 FYI Only or Action Required?: Action required by provider: Refusing disposition, needs new order for machine due to broken part with delivery.  Patient is followed in Pulmonology for COPD, last seen on 05/02/2024 by Isadora Hose, MD.  Called Nurse Triage reporting Chills, Nausea, Nasal Congestion, Fatigue, and low appetite.  Symptoms began several days ago.  Interventions attempted: Prescription medications: now on z-pak and doxycycline  from ED and Other: ED visit 9/6, using her inhalers.  Symptoms are: not worsening per pt.  Triage Disposition: See HCP Within 4 Hours (Or PCP Triage)  Patient/caregiver understands and will follow disposition?: No, refuses disposition     Copied from CRM [id number]. Topic: Clinical - Red Word Triage >> May 09, 2024 12:17 PM Joesph PARAS wrote: Red Word that prompted transfer to Nurse Triage: States is having severe cold chills and is nauseous. Requesting to speak to a nurse. States has already been treated. Reason for Disposition  SEVERE chills (i.e., feeling extremely cold WITH shaking chills)  Answer Assessment - Initial Assessment Questions 1. SYMPTOM: What's the main symptom you're concerned about? (e.g., breathing difficulty, fever, weakness)     Shaky cold chills Sinuses draining, nausea No fever Don't feel good Trying to eat something Confirms warms up under a blanket 3. BETTER-SAME-WORSE: Are you getting better, staying the same, or getting worse compared to the day you were discharged?     Not worse 4. HOSPITALIZATION: How long were you hospitalized? (e.g., days)     9/6 ED 5. DISCHARGE DATE: What date were you discharged from the hospital?     9/6 9. DISCHARGE ANTIBIOTICS: Are you taking any antibiotic medicine now?  Just little bit pneumonia, gave z-pak, doxycycline   10. BREATHING DIFFICULTY: Are you having any difficulty breathing? If Yes, ask: How bad is it?  (e.g., none, mild, moderate, severe)        No worsening  SOB Got inhalers helping  12. OTHER SYMPTOMS: Do you have any other symptoms? (e.g., weakness, confusion, pain)       Tried to throw up and just heaving No chest pain or dizziness Coughing up clear Not weak, just back hurts little bit, did chest x-ray at ED just a little pneumonia   Just brought my machine yesterday, pneumonia hospital Brought machine last night Part broke on machine, have to get another one, need another order in  Can't get out, ride the buses, want to see how feel, declines scheduling today or other day this week Advised if get worse go to ED, pt states she will do so   Advised exam in next 4 hours, pt refusing, advised go to ED if any worsening or new symptoms. Confirmed need to have something on stomach with antibx to best prevent stomach upset. Sending message to pulm for follow up and to confirm need for new order for machine since broken part on one just delivered to her.  Protocols used: Fever-A-AH, Pneumonia on Antibiotic Post-Hospitalization Follow-up Call-A-AH

## 2024-05-11 NOTE — Telephone Encounter (Signed)
 Order has been placed.  I have notified the patient.  Nothing further needed.

## 2024-05-12 ENCOUNTER — Encounter: Payer: Self-pay | Admitting: Emergency Medicine

## 2024-05-12 ENCOUNTER — Emergency Department

## 2024-05-12 ENCOUNTER — Other Ambulatory Visit: Payer: Self-pay

## 2024-05-12 ENCOUNTER — Emergency Department
Admission: EM | Admit: 2024-05-12 | Discharge: 2024-05-12 | Disposition: A | Discharge status: Home / Self Care | Attending: Emergency Medicine | Admitting: Emergency Medicine

## 2024-05-12 DIAGNOSIS — I700 Atherosclerosis of aorta: Secondary | ICD-10-CM | POA: Diagnosis not present

## 2024-05-12 DIAGNOSIS — J189 Pneumonia, unspecified organism: Secondary | ICD-10-CM | POA: Diagnosis present

## 2024-05-12 DIAGNOSIS — R531 Weakness: Secondary | ICD-10-CM | POA: Diagnosis not present

## 2024-05-12 DIAGNOSIS — R6883 Chills (without fever): Secondary | ICD-10-CM | POA: Diagnosis not present

## 2024-05-12 DIAGNOSIS — J439 Emphysema, unspecified: Secondary | ICD-10-CM | POA: Diagnosis not present

## 2024-05-12 DIAGNOSIS — R509 Fever, unspecified: Secondary | ICD-10-CM | POA: Diagnosis present

## 2024-05-12 DIAGNOSIS — R112 Nausea with vomiting, unspecified: Secondary | ICD-10-CM | POA: Insufficient documentation

## 2024-05-12 DIAGNOSIS — E876 Hypokalemia: Secondary | ICD-10-CM | POA: Insufficient documentation

## 2024-05-12 LAB — CBC WITH DIFFERENTIAL/PLATELET
Abs Immature Granulocytes: 0.03 K/uL (ref 0.00–0.07)
Basophils Absolute: 0.1 K/uL (ref 0.0–0.1)
Basophils Relative: 1 %
Eosinophils Absolute: 0.2 K/uL (ref 0.0–0.5)
Eosinophils Relative: 3 %
HCT: 43.2 % (ref 36.0–46.0)
Hemoglobin: 13.8 g/dL (ref 12.0–15.0)
Immature Granulocytes: 1 %
Lymphocytes Relative: 25 %
Lymphs Abs: 1.5 K/uL (ref 0.7–4.0)
MCH: 29.2 pg (ref 26.0–34.0)
MCHC: 31.9 g/dL (ref 30.0–36.0)
MCV: 91.5 fL (ref 80.0–100.0)
Monocytes Absolute: 0.5 K/uL (ref 0.1–1.0)
Monocytes Relative: 8 %
Neutro Abs: 3.8 K/uL (ref 1.7–7.7)
Neutrophils Relative %: 62 %
Platelets: 212 K/uL (ref 150–400)
RBC: 4.72 MIL/uL (ref 3.87–5.11)
RDW: 12.7 % (ref 11.5–15.5)
WBC: 6 K/uL (ref 4.0–10.5)
nRBC: 0 % (ref 0.0–0.2)

## 2024-05-12 LAB — RESP PANEL BY RT-PCR (RSV, FLU A&B, COVID)  RVPGX2
Influenza A by PCR: NEGATIVE
Influenza B by PCR: NEGATIVE
Resp Syncytial Virus by PCR: NEGATIVE
SARS Coronavirus 2 by RT PCR: NEGATIVE

## 2024-05-12 LAB — COMPREHENSIVE METABOLIC PANEL WITH GFR
ALT: 12 U/L (ref 0–44)
AST: 16 U/L (ref 15–41)
Albumin: 3.4 g/dL — ABNORMAL LOW (ref 3.5–5.0)
Alkaline Phosphatase: 66 U/L (ref 38–126)
Anion gap: 9 (ref 5–15)
BUN: 14 mg/dL (ref 8–23)
CO2: 30 mmol/L (ref 22–32)
Calcium: 8.9 mg/dL (ref 8.9–10.3)
Chloride: 104 mmol/L (ref 98–111)
Creatinine, Ser: 0.76 mg/dL (ref 0.44–1.00)
GFR, Estimated: >60 mL/min (ref >60)
Glucose, Bld: 97 mg/dL (ref 70–99)
Potassium: 3.4 mmol/L — ABNORMAL LOW (ref 3.5–5.1)
Sodium: 143 mmol/L (ref 135–145)
Total Bilirubin: 1.2 mg/dL (ref 0.0–1.2)
Total Protein: 5.5 g/dL — ABNORMAL LOW (ref 6.5–8.1)

## 2024-05-12 LAB — LIPASE, BLOOD: Lipase: 36 U/L (ref 11–51)

## 2024-05-12 LAB — LACTIC ACID, PLASMA: Lactic Acid, Venous: 0.8 mmol/L (ref 0.5–1.9)

## 2024-05-12 MED ORDER — ONDANSETRON 4 MG PO TBDP
4.0000 mg | ORAL_TABLET | Freq: Three times a day (TID) | ORAL | 0 refills | Status: DC | PRN
Start: 2024-05-12 — End: 2024-06-03

## 2024-05-12 NOTE — ED Provider Notes (Signed)
 [facility] Provider Note    Event Date/Time   First MD Initiated Contact with Patient 05/12/24 1112     (approximate)   History   Weakness, nausea, chills   HPI  Kelli Phillips is a 73 y.o. female  who presents to the emergency department today because of concerns for weakness nausea and chills.  Patient was seen in the emergency department 6 days ago for shortness of breath and was diagnosed with pneumonia at that time.  She has been taking the antibiotics that were prescribed to her.  She states however that she continues to feel weak.  Her shortness of breath has improved.  Today the nausea was particularly bad with multiple episodes of vomiting.  Yellow emesis.  No blood.  Patient denies any associated diarrhea.  Did have chills.    Physical Exam   Triage Vital Signs: ED Triage Vitals  Encounter Vitals Group     BP 05/12/24 1055 (!) 144/81     Girls Systolic BP Percentile --      Girls Diastolic BP Percentile --      Boys Systolic BP Percentile --      Boys Diastolic BP Percentile --      Pulse Rate 05/12/24 1055 72     Resp 05/12/24 1101 16     Temp 05/12/24 1101 (!) 97.5 F (36.4 C)     Temp Source 05/12/24 1101 Oral     SpO2 05/12/24 1055 97 %     Weight 05/12/24 1055 109 lb 12.6 oz (49.8 kg)     Height 05/12/24 1114 5' 5 (1.651 m)     Head Circumference --      Peak Flow --      Pain Score 05/12/24 1055 0     Pain Loc --      Pain Education --      Exclude from Growth Chart --     Most recent vital signs: Vitals:   05/12/24 1055 05/12/24 1101  BP: (!) 144/81   Pulse: 72   Resp:  16  Temp:  (!) 97.5 F (36.4 C)  SpO2: 97%    General: Awake, alert, oriented. CV:  Good peripheral perfusion. Regular rate and rhythm. Resp:  Normal effort. Lungs clear. Abd:  No distention.    ED Results / Procedures / Treatments   Labs (all labs ordered are listed, but only abnormal results are displayed) Labs Reviewed  COMPREHENSIVE  METABOLIC PANEL WITH GFR - Abnormal; Notable for the following components:      Result Value   Potassium 3.4 (*)    Total Protein 5.5 (*)    Albumin 3.4 (*)    All other components within normal limits  RESP PANEL BY RT-PCR (RSV, FLU A&B, COVID)  RVPGX2  CULTURE, BLOOD (ROUTINE X 2)  CULTURE, BLOOD (ROUTINE X 2)  CBC WITH DIFFERENTIAL/PLATELET  LACTIC ACID, PLASMA  LIPASE, BLOOD  LACTIC ACID, PLASMA  URINALYSIS, ROUTINE W REFLEX MICROSCOPIC     EKG  None   RADIOLOGY I independently interpreted and visualized the CXR. My interpretation: No pneumonia Radiology interpretation:  IMPRESSION:  1. Emphysema with large lung volumes and flattening of the  diaphragms.  2. Aortic Atherosclerosis (ICD10-I70.0) and Emphysema (ICD10-J43.9).    PROCEDURES:  Critical Care performed: No   MEDICATIONS ORDERED IN ED: Medications - No data to display   IMPRESSION / MDM / ASSESSMENT AND PLAN / ED COURSE  I reviewed the triage vital signs and the  nursing notes.                              Differential diagnosis includes, but is not limited to, pneumonia, viral illness, intraabdominal infection  Patient's presentation is most consistent with acute presentation with potential threat to life or bodily function.  Patient presented to the emergency department today because of concerns for weakness, nausea and chills.  On exam he clear.  Patient was afebrile.  Blood work without concerning leukocytosis or lactic acidosis.  X-ray without any obvious pneumonia.  COVID and flu negative.  At this time I do wonder if patient was suffering from some other viral illness or perhaps some nausea is related to antibiotic use.  I discussed this with the patient.  Given reassuring workup in the emergency department I think it is reasonable for patient to be discharged home.  Will give prescription for Zofran .   FINAL CLINICAL IMPRESSION(S) / ED DIAGNOSES   Final diagnoses:  Nausea and vomiting,  unspecified vomiting type  Weakness    Note:  This document was prepared using Dragon voice recognition software and may include unintentional dictation errors.    Floy Roberts, MD 05/12/24 1341

## 2024-05-12 NOTE — ED Triage Notes (Signed)
 Diagnosed with pneumonia on Saturday, taking antibiotics.  Took antibiotic this morning and vomited pill.  [facility] called because of vomiting this morning.  67 98% 2l/ [place] (home oxygen ) CBG: 85  18 LAC 4 Zofran  given PTA

## 2024-05-12 NOTE — ED Notes (Signed)
 See triage note  Presents via EMS from home   States she is currently being treated for pneumonia   States she has had body aches,fever,chills  but this am she vomited at least 3 times   Also feeling weak  Afebrile on arrival

## 2024-05-12 NOTE — ED Notes (Signed)
 ED Provider at bedside.

## 2024-05-17 LAB — CULTURE, BLOOD (ROUTINE X 2)
Culture: NO GROWTH
Culture: NO GROWTH

## 2024-05-18 ENCOUNTER — Ambulatory Visit: Payer: Self-pay

## 2024-05-18 ENCOUNTER — Ambulatory Visit: Admitting: Internal Medicine

## 2024-05-18 NOTE — Telephone Encounter (Signed)
 FYI Only or Action Required?: FYI only for provider.  Patient requested to transfer care. TOC appointment made for next month. Patient denies any other symptoms at this time  Patient was last seen in primary care on 10/11/2023 by Hope Merle, MD.  Called Nurse Triage reporting Advice Only.  Triage Disposition: Information or Advice Only Call  Patient/caregiver understands and will follow disposition?: Yes  Copied from CRM [id number]. Topic: Clinical - Red Word Triage >> May 18, 2024  2:28 PM Abigail D wrote: Red Word that prompted transfer to Nurse Triage: Patient would like to speak to a nurse, she is still feeling weak from the pneumonia and has a fever as well, she said she feels chills. She said she would like to get an order for a blood pressure cuff and a thermometer for home so she doesn't have to go out and get it taken. Reason for Disposition  [1] Other NON-URGENT information for PCP AND [2] does not require PCP response  Answer Assessment - Initial Assessment Questions 1. REASON FOR CALL or QUESTION: What is your reason for calling today? or How can I best     Patient calling with the main objective of wanting to get a blood pressure cuff and thermometer ordered. Patient was asking if this could be ordered through pulmonary. High recommended patient get scheduled with primary office. Patient refused to go back to current primary office. Discussed with patient get established with a new provider. Patient agreed to a transfer of care. Patient is scheduled for June 12, 2024 at 1:40 PM for [facility]. Address and phone number given to patient. Patient denies any symptoms currently-no shortness of breath or chest pain. Patient verbalized understanding of the appointment.  2. CALLER: Document the source of call. (e.g., laboratory staff, caregiver or patient).     patient  Protocols used: PCP Call - No Triage-A-AH

## 2024-05-23 ENCOUNTER — Encounter: Admitting: Family

## 2024-05-30 ENCOUNTER — Telehealth: Payer: Self-pay | Admitting: Family

## 2024-05-30 NOTE — Telephone Encounter (Signed)
 Called to confirm/remind patient of their appointment at the [facility] on 05/30/24.   Appointment:   [x] Confirmed  [] Left mess   [] No answer/No voice mail  [] VM Full/unable to leave message  [] Phone not in service  Patient reminded to bring all medications and/or complete list.  Confirmed patient has transportation. Gave directions, instructed to utilize valet parking.

## 2024-05-30 NOTE — Progress Notes (Unsigned)
 [facility] Note   Referring Physician: PCP: Pcp, No PCP-Cardiologist: None   Chief Complaint:  HPI:  Kelli Phillips is a 73 y/o female with a history of COPD.  Echo 10/23/22 showed an EF of 55-60%  Was in the ED 09/10/22 due to leg swelling. No DVT. Oral lasix  started. Was in the ED 06/26/22 due to COPD exacerbation.   She presents today for a HF follow-up visit with a chief complaint of anxiety. Describes this as chronic although intermittent in nature. Says that this has worsened some as she's having issues with her neighbor across the street. Has no other symptoms and specifically denies any difficulty sleeping, dizziness, abdominal distention, palpitations, pedal edema, chest pain, wheezing, SOB, cough or fatigue.      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 8 g 5   cyanocobalamin  (VITAMIN B12) 1000 MCG tablet Take 2 tablets (2,000 mcg total) by mouth daily. 90 tablet 3   Glycopyrrolate -Formoterol  (BEVESPI  AEROSPHERE) 9-4.8 MCG/ACT AERO INHALE 2 PUFFS INTO THE LUNGS TWICE A DAY 10.7 each 5   ondansetron  (ZOFRAN -ODT) 4 MG disintegrating tablet Take 1 tablet (4 mg total) by mouth every 8 (eight) hours as needed for nausea or vomiting. 20 tablet 0   risperiDONE (RISPERDAL) 0.5 MG tablet Take 0.5 mg by mouth at bedtime. (Patient not taking: Reported on 05/02/2024)     Vitamin D , Ergocalciferol , (DRISDOL ) 1.25 MG (50000  UNIT) CAPS capsule Take 1 capsule (50,000 Units total) by mouth every 7 (seven) days. (Patient not taking: Reported on 05/02/2024) 12 capsule 1   No current facility-administered medications for this visit.    Allergies  Allergen Reactions   Morphine And Codeine Other (See Comments)   Penicillins Rash   Sulfa Antibiotics Rash      Social History   Socioeconomic History   Marital status: Divorced    Spouse name: Not on file   Number of children: Not on file   Years of education: Not on file   Highest education level: Not on file  Occupational History   Not on file  Tobacco Use   Smoking status: Former    Current packs/day: 0.00    Average packs/day: 1 pack/day for 43.0 years (43.0 ttl pk-yrs)    Types: Cigarettes    Start date: 59    Quit date: 2009    Years since quitting: 16.7   Smokeless tobacco: Never  Vaping Use   Vaping status: Never Used  Substance and Sexual Activity   Alcohol use: No   Drug use: No   Sexual activity: Yes    Birth control/protection: Post-menopausal  Other Topics Concern   Not on file  Social History Narrative   Not on file   Social Drivers of Health   Financial Resource Strain: Low Risk  (02/18/2024)   Overall Financial Resource Strain (CARDIA)  Difficulty of Paying Living Expenses: Not hard at all  Food Insecurity: No Food Insecurity (02/18/2024)   Hunger Vital Sign    Worried About Running Out of Food in the Last Year: Never true    Ran Out of Food in the Last Year: Never true  Transportation Needs: No Transportation Needs (02/18/2024)   PRAPARE - Administrator, Civil Service (Medical): No    Lack of Transportation (Non-Medical): No  Physical Activity: Insufficiently Active (02/18/2024)   Exercise Vital Sign    Days of Exercise per Week: 3 days    Minutes of Exercise per Session: 20 min  Stress: No Stress Concern Present (02/18/2024)   [employer] of Occupational Health - Occupational Stress Questionnaire     Feeling of Stress: Only a little  Social Connections: Moderately Isolated (02/18/2024)   Social Connection and Isolation Panel    Frequency of Communication with Friends and Family: More than three times a week    Frequency of Social Gatherings with Friends and Family: More than three times a week    Attends Religious Services: More than 4 times per year    Active Member of [employer] or Organizations: No    Attends Banker Meetings: Never    Marital Status: Divorced  Catering manager Violence: Not At Risk (02/18/2024)   Humiliation, Afraid, Rape, and Kick questionnaire    Fear of Current or Ex-Partner: No    Emotionally Abused: No    Physically Abused: No    Sexually Abused: No      Family History  Problem Relation Age of Onset   Hypertension Mother    COPD Mother    Heart failure Mother    Cancer Father    Alcohol abuse Father    Colon cancer Father    Heart failure Sister    Heart attack Brother 21   Lung cancer Brother    Breast cancer Paternal Aunt        ?   Heart failure Maternal Grandmother        PHYSICAL EXAM: General:  Well appearing. No respiratory difficulty HEENT: normal Neck: supple. no JVD. Carotids 2+ bilat; no bruits. No lymphadenopathy or thyromegaly appreciated. Cor: PMI nondisplaced. Regular rate & rhythm. No rubs, gallops or murmurs. Lungs: clear Abdomen: soft, nontender, nondistended. No hepatosplenomegaly. No bruits or masses. Good bowel sounds. Extremities: no cyanosis, clubbing, rash, edema Neuro: alert & oriented x 3, cranial nerves grossly intact. moves all 4 extremities w/o difficulty. Affect pleasant.  ECG:   ASSESSMENT & PLAN:  1: SOB/ Chronic heart failure preserved ejection fraction- - NYHA class II - euvolemic - weighing daily and call for an overnight weight gain of > 2 pounds or a weekly weight gain of > 5 pounds - weight up 1.4 pounds from last visit here 1 month ago - not adding salt except on rare occasions -  reviewed echo results with the patient - saw cardiology Florestine) 10/07/22 - BNP 09/10/22 was 90.2  2: COPD- - chest CT done 09/14/22  - saw pulmonology (Kasa) 08/17/22 - oxygen  at 2L at bedtime & PRN during the day - BMP 09/23/22 showed sodium 141, potassium 3.8, creatinine 0.76 and GFR >60 - quit smoking 2009   Medication list reviewed.   Due to symptom stability and echo results, will not make a return appt at this time. Advised patient that she could call back at anytime for questions or if she needed another appointment and she was comfortable with this  plan.    Ellouise DELENA Class, FNP 05/30/24

## 2024-05-31 ENCOUNTER — Other Ambulatory Visit: Payer: Self-pay

## 2024-05-31 ENCOUNTER — Encounter: Payer: Self-pay | Admitting: Family

## 2024-05-31 ENCOUNTER — Emergency Department

## 2024-05-31 ENCOUNTER — Inpatient Hospital Stay
Admission: EM | Admit: 2024-05-31 | Discharge: 2024-06-03 | DRG: 191 | Disposition: A | Discharge status: Home / Self Care | Attending: Internal Medicine | Admitting: Internal Medicine

## 2024-05-31 ENCOUNTER — Ambulatory Visit (HOSPITAL_BASED_OUTPATIENT_CLINIC_OR_DEPARTMENT_OTHER): Admitting: Family

## 2024-05-31 ENCOUNTER — Telehealth: Payer: Self-pay | Admitting: Licensed Clinical Social Worker

## 2024-05-31 ENCOUNTER — Encounter: Payer: Self-pay | Admitting: Internal Medicine

## 2024-05-31 VITALS — BP 131/83 | HR 82 | Wt 107.5 lb

## 2024-05-31 DIAGNOSIS — J449 Chronic obstructive pulmonary disease, unspecified: Secondary | ICD-10-CM

## 2024-05-31 DIAGNOSIS — F419 Anxiety disorder, unspecified: Secondary | ICD-10-CM

## 2024-05-31 DIAGNOSIS — G8929 Other chronic pain: Secondary | ICD-10-CM | POA: Diagnosis not present

## 2024-05-31 DIAGNOSIS — R062 Wheezing: Secondary | ICD-10-CM | POA: Insufficient documentation

## 2024-05-31 DIAGNOSIS — M810 Age-related osteoporosis without current pathological fracture: Secondary | ICD-10-CM | POA: Insufficient documentation

## 2024-05-31 DIAGNOSIS — J441 Chronic obstructive pulmonary disease with (acute) exacerbation: Secondary | ICD-10-CM | POA: Diagnosis not present

## 2024-05-31 DIAGNOSIS — Z800 Family history of malignant neoplasm of digestive organs: Secondary | ICD-10-CM

## 2024-05-31 DIAGNOSIS — F410 Panic disorder [episodic paroxysmal anxiety] without agoraphobia: Secondary | ICD-10-CM | POA: Insufficient documentation

## 2024-05-31 DIAGNOSIS — M5440 Lumbago with sciatica, unspecified side: Secondary | ICD-10-CM | POA: Diagnosis not present

## 2024-05-31 DIAGNOSIS — M549 Dorsalgia, unspecified: Secondary | ICD-10-CM | POA: Insufficient documentation

## 2024-05-31 DIAGNOSIS — K219 Gastro-esophageal reflux disease without esophagitis: Secondary | ICD-10-CM | POA: Insufficient documentation

## 2024-05-31 DIAGNOSIS — Z885 Allergy status to narcotic agent status: Secondary | ICD-10-CM

## 2024-05-31 DIAGNOSIS — J439 Emphysema, unspecified: Secondary | ICD-10-CM | POA: Diagnosis present

## 2024-05-31 DIAGNOSIS — Z880 Allergy status to penicillin: Secondary | ICD-10-CM

## 2024-05-31 DIAGNOSIS — Z87891 Personal history of nicotine dependence: Secondary | ICD-10-CM | POA: Insufficient documentation

## 2024-05-31 DIAGNOSIS — Z9981 Dependence on supplemental oxygen: Secondary | ICD-10-CM

## 2024-05-31 DIAGNOSIS — R531 Weakness: Secondary | ICD-10-CM | POA: Insufficient documentation

## 2024-05-31 DIAGNOSIS — Z8249 Family history of ischemic heart disease and other diseases of the circulatory system: Secondary | ICD-10-CM

## 2024-05-31 DIAGNOSIS — R600 Localized edema: Secondary | ICD-10-CM | POA: Insufficient documentation

## 2024-05-31 DIAGNOSIS — I5032 Chronic diastolic (congestive) heart failure: Secondary | ICD-10-CM | POA: Diagnosis present

## 2024-05-31 DIAGNOSIS — Z825 Family history of asthma and other chronic lower respiratory diseases: Secondary | ICD-10-CM

## 2024-05-31 DIAGNOSIS — Z79899 Other long term (current) drug therapy: Secondary | ICD-10-CM

## 2024-05-31 DIAGNOSIS — M5124 Other intervertebral disc displacement, thoracic region: Secondary | ICD-10-CM | POA: Diagnosis present

## 2024-05-31 DIAGNOSIS — Z811 Family history of alcohol abuse and dependence: Secondary | ICD-10-CM

## 2024-05-31 DIAGNOSIS — Z882 Allergy status to sulfonamides status: Secondary | ICD-10-CM

## 2024-05-31 DIAGNOSIS — R0602 Shortness of breath: Secondary | ICD-10-CM | POA: Diagnosis not present

## 2024-05-31 DIAGNOSIS — Z1152 Encounter for screening for COVID-19: Secondary | ICD-10-CM

## 2024-05-31 DIAGNOSIS — Z803 Family history of malignant neoplasm of breast: Secondary | ICD-10-CM

## 2024-05-31 DIAGNOSIS — R252 Cramp and spasm: Secondary | ICD-10-CM | POA: Insufficient documentation

## 2024-05-31 DIAGNOSIS — Z85828 Personal history of other malignant neoplasm of skin: Secondary | ICD-10-CM

## 2024-05-31 DIAGNOSIS — M4804 Spinal stenosis, thoracic region: Secondary | ICD-10-CM | POA: Diagnosis present

## 2024-05-31 DIAGNOSIS — Z801 Family history of malignant neoplasm of trachea, bronchus and lung: Secondary | ICD-10-CM

## 2024-05-31 LAB — CBC WITH DIFFERENTIAL/PLATELET
Abs Immature Granulocytes: 0.01 K/uL (ref 0.00–0.07)
Basophils Absolute: 0 K/uL (ref 0.0–0.1)
Basophils Relative: 1 %
Eosinophils Absolute: 0.2 K/uL (ref 0.0–0.5)
Eosinophils Relative: 4 %
HCT: 42.6 % (ref 36.0–46.0)
Hemoglobin: 14.1 g/dL (ref 12.0–15.0)
Immature Granulocytes: 0 %
Lymphocytes Relative: 35 %
Lymphs Abs: 1.7 K/uL (ref 0.7–4.0)
MCH: 29.6 pg (ref 26.0–34.0)
MCHC: 33.1 g/dL (ref 30.0–36.0)
MCV: 89.5 fL (ref 80.0–100.0)
Monocytes Absolute: 0.4 K/uL (ref 0.1–1.0)
Monocytes Relative: 8 %
Neutro Abs: 2.5 K/uL (ref 1.7–7.7)
Neutrophils Relative %: 52 %
Platelets: 203 K/uL (ref 150–400)
RBC: 4.76 MIL/uL (ref 3.87–5.11)
RDW: 12.3 % (ref 11.5–15.5)
WBC: 4.7 K/uL (ref 4.0–10.5)
nRBC: 0 % (ref 0.0–0.2)

## 2024-05-31 LAB — BRAIN NATRIURETIC PEPTIDE: B Natriuretic Peptide: 17.9 pg/mL (ref 0.0–100.0)

## 2024-05-31 LAB — COMPREHENSIVE METABOLIC PANEL WITH GFR
ALT: 11 U/L (ref 0–44)
AST: 17 U/L (ref 15–41)
Albumin: 3.6 g/dL (ref 3.5–5.0)
Alkaline Phosphatase: 69 U/L (ref 38–126)
Anion gap: 11 (ref 5–15)
BUN: 12 mg/dL (ref 8–23)
CO2: 27 mmol/L (ref 22–32)
Calcium: 9.1 mg/dL (ref 8.9–10.3)
Chloride: 100 mmol/L (ref 98–111)
Creatinine, Ser: 0.45 mg/dL (ref 0.44–1.00)
GFR, Estimated: >60 mL/min (ref >60)
Glucose, Bld: 105 mg/dL — ABNORMAL HIGH (ref 70–99)
Potassium: 3.8 mmol/L (ref 3.5–5.1)
Sodium: 138 mmol/L (ref 135–145)
Total Bilirubin: 0.7 mg/dL (ref 0.0–1.2)
Total Protein: 6.4 g/dL — ABNORMAL LOW (ref 6.5–8.1)

## 2024-05-31 LAB — RESP PANEL BY RT-PCR (RSV, FLU A&B, COVID)  RVPGX2
Influenza A by PCR: NEGATIVE
Influenza B by PCR: NEGATIVE
Resp Syncytial Virus by PCR: NEGATIVE
SARS Coronavirus 2 by RT PCR: NEGATIVE

## 2024-05-31 LAB — TROPONIN I (HIGH SENSITIVITY): Troponin I (High Sensitivity): 3 ng/L (ref <18)

## 2024-05-31 MED ORDER — ENSURE PLUS HIGH PROTEIN PO LIQD
237.0000 mL | Freq: Two times a day (BID) | ORAL | Status: DC
  Administered 2024-06-01: 237 mL via ORAL

## 2024-05-31 MED ORDER — METHYLPREDNISOLONE SODIUM SUCC 125 MG IJ SOLR
125.0000 mg | Freq: Once | INTRAMUSCULAR | Status: AC
  Administered 2024-05-31: 125 mg via INTRAVENOUS
  Filled 2024-05-31: qty 2

## 2024-05-31 MED ORDER — DM-GUAIFENESIN ER 30-600 MG PO TB12: 1.0000 | ORAL_TABLET | Freq: Two times a day (BID) | ORAL | Status: DC | PRN

## 2024-05-31 MED ORDER — KETOROLAC TROMETHAMINE 30 MG/ML IJ SOLN
30.0000 mg | Freq: Once | INTRAMUSCULAR | Status: AC
Start: 2024-05-31 — End: 2024-05-31
  Administered 2024-05-31: 30 mg via INTRAVENOUS
  Filled 2024-05-31: qty 1

## 2024-05-31 MED ORDER — AZITHROMYCIN 500 MG PO TABS
500.0000 mg | ORAL_TABLET | Freq: Every day | ORAL | Status: AC
  Administered 2024-06-01: 500 mg via ORAL
  Filled 2024-05-31: qty 1

## 2024-05-31 MED ORDER — DIPHENHYDRAMINE HCL 50 MG/ML IJ SOLN: 12.5000 mg | Freq: Three times a day (TID) | INTRAMUSCULAR | Status: DC | PRN

## 2024-05-31 MED ORDER — IPRATROPIUM-ALBUTEROL 0.5-2.5 (3) MG/3ML IN SOLN
3.0000 mL | Freq: Once | RESPIRATORY_TRACT | Status: AC
  Administered 2024-05-31: 3 mL via RESPIRATORY_TRACT
  Filled 2024-05-31: qty 3

## 2024-05-31 MED ORDER — LIDOCAINE 5 % EX PTCH
1.0000 | MEDICATED_PATCH | CUTANEOUS | Status: DC
  Administered 2024-05-31 – 2024-06-02 (×3): 1 via TRANSDERMAL
  Filled 2024-05-31 (×3): qty 1

## 2024-05-31 MED ORDER — AZITHROMYCIN 500 MG PO TABS
250.0000 mg | ORAL_TABLET | Freq: Every day | ORAL | Status: DC
Start: 2024-06-02 — End: 2024-06-03
  Administered 2024-06-02 – 2024-06-03 (×2): 250 mg via ORAL
  Filled 2024-05-31 (×2): qty 1

## 2024-05-31 MED ORDER — ENOXAPARIN SODIUM 40 MG/0.4ML IJ SOSY
40.0000 mg | PREFILLED_SYRINGE | INTRAMUSCULAR | Status: DC
Start: 2024-05-31 — End: 2024-06-03
  Administered 2024-05-31 – 2024-06-02 (×3): 40 mg via SUBCUTANEOUS
  Filled 2024-05-31 (×3): qty 0.4

## 2024-05-31 MED ORDER — LORAZEPAM 0.5 MG PO TABS: 0.5000 mg | ORAL_TABLET | Freq: Two times a day (BID) | ORAL | Status: DC | PRN

## 2024-05-31 MED ORDER — ACETAMINOPHEN 325 MG PO TABS
650.0000 mg | ORAL_TABLET | Freq: Four times a day (QID) | ORAL | Status: DC | PRN
  Administered 2024-06-02: 650 mg via ORAL
  Filled 2024-05-31: qty 2

## 2024-05-31 MED ORDER — ALBUTEROL SULFATE (2.5 MG/3ML) 0.083% IN NEBU
3.0000 mL | INHALATION_SOLUTION | RESPIRATORY_TRACT | Status: DC | PRN
  Administered 2024-06-03: 3 mL via RESPIRATORY_TRACT
  Filled 2024-05-31: qty 3

## 2024-05-31 MED ORDER — IPRATROPIUM-ALBUTEROL 0.5-2.5 (3) MG/3ML IN SOLN
3.0000 mL | RESPIRATORY_TRACT | Status: DC
  Administered 2024-05-31 – 2024-06-01 (×2): 3 mL via RESPIRATORY_TRACT
  Filled 2024-05-31 (×2): qty 3

## 2024-05-31 MED ORDER — CYCLOBENZAPRINE HCL 10 MG PO TABS
5.0000 mg | ORAL_TABLET | Freq: Three times a day (TID) | ORAL | Status: DC | PRN
  Administered 2024-05-31: 5 mg via ORAL
  Filled 2024-05-31: qty 1

## 2024-05-31 MED ORDER — METHYLPREDNISOLONE SODIUM SUCC 125 MG IJ SOLR
80.0000 mg | Freq: Every day | INTRAMUSCULAR | Status: DC
  Administered 2024-06-01 – 2024-06-03 (×3): 80 mg via INTRAVENOUS
  Filled 2024-05-31 (×3): qty 2

## 2024-05-31 NOTE — ED Notes (Signed)
 Called [facility]CMD for monitoring

## 2024-05-31 NOTE — ED Triage Notes (Signed)
 Hx of COPD, SOB started about 30 min ago, no relief from inhaler.  Also c/o back pain started last night along with tightness across her chest.

## 2024-05-31 NOTE — ED Provider Notes (Signed)
 [facility] Provider Note    Event Date/Time   First MD Initiated Contact with Patient 05/31/24 1939     (approximate)   History   Shortness of Breath and Back Pain   HPI  Kelli Phillips is a 73 y.o. female with a history of COPD, GERD, anxiety, and osteoporosis who presents with acute onset of shortness of breath about 30 minutes ago, not relieved by her inhaler, associate with some back pain since yesterday as well as chest tightness and a cough.  She also has been feeling somewhat dizzy and lightheaded, and sometimes she feels like she is losing her balance or falling to one side when she walks.  She denies any leg swelling.  I reviewed the past medical records.  The patient was seen in the heart failure clinic by NP [facility] today for the first time since February 2024 with increased shortness of breath, wheezing, and intermittent pedal edema.  Her symptoms were thought to be more due to COPD.   Physical Exam   Triage Vital Signs: ED Triage Vitals  Encounter Vitals Group     BP      Girls Systolic BP Percentile      Girls Diastolic BP Percentile      Boys Systolic BP Percentile      Boys Diastolic BP Percentile      Pulse      Resp      Temp      Temp src      SpO2      Weight      Height      Head Circumference      Peak Flow      Pain Score      Pain Loc      Pain Education      Exclude from Growth Chart     Most recent vital signs: Vitals:   05/31/24 2017 05/31/24 2130  BP:  128/73  Pulse: 85 88  Resp: (!) 24 (!) 37  Temp:  97.9 F (36.6 C)  SpO2: 100% 90%     General: Alert and oriented, no distress.  CV:  Good peripheral perfusion.  Normal heart sounds. Resp:  Increased effort. Diminished breath sounds bilaterally.  No rales, no significant wheezes. Abd:  No distention.  Other:  No peripheral edema.   ED Results / Procedures / Treatments   Labs (all labs ordered are listed, but only abnormal results are  displayed) Labs Reviewed  COMPREHENSIVE METABOLIC PANEL WITH GFR - Abnormal; Notable for the following components:      Result Value   Glucose, Bld 105 (*)    Total Protein 6.4 (*)    All other components within normal limits  RESP PANEL BY RT-PCR (RSV, FLU A&B, COVID)  [id number]  CBC WITH DIFFERENTIAL/PLATELET  BRAIN NATRIURETIC PEPTIDE  CBC  TROPONIN I (HIGH SENSITIVITY)     EKG  ED ECG REPORT I, Waylon Cassis, the attending physician, personally viewed and interpreted this ECG.  Date: 05/31/2024 EKG Time: 1943 Rate: 80 Rhythm: normal sinus rhythm QRS Axis: normal Intervals: Prolonged OTc ST/T Wave abnormalities: Nonspecific ST abnormality  Narrative Interpretation: no evidence of acute ischemia    RADIOLOGY  Chest x-ray: I independently viewed and interpreted the images; there is no focal consolidation or edema  PROCEDURES:  Critical Care performed: No  Procedures   MEDICATIONS ORDERED IN ED: Medications  ipratropium-albuterol  (DUONEB) 0.5-2.5 (3) MG/3ML nebulizer solution 3 mL (3 mLs Nebulization Given  05/31/24 2150)  albuterol  (PROVENTIL ) (2.5 MG/3ML) 0.083% nebulizer solution 3 mL (has no administration in time range)  dextromethorphan-guaiFENesin  (MUCINEX  DM) 30-600 MG per 12 hr tablet 1 tablet (has no administration in time range)  diphenhydrAMINE (BENADRYL) injection 12.5 mg (has no administration in time range)  acetaminophen  (TYLENOL ) tablet 650 mg (has no administration in time range)  cyclobenzaprine  (FLEXERIL ) tablet 5 mg (has no administration in time range)  methylPREDNISolone  sodium succinate (SOLU-MEDROL ) 125 mg/2 mL injection 80 mg (has no administration in time range)  enoxaparin  (LOVENOX ) injection 40 mg (has no administration in time range)  lidocaine  (LIDODERM ) 5 % 1 patch (has no administration in time range)  LORazepam  (ATIVAN ) tablet 0.5 mg (has no administration in time range)  methylPREDNISolone  sodium succinate (SOLU-MEDROL ) 125 mg/2  mL injection 125 mg (125 mg Intravenous Given 05/31/24 2003)  ipratropium-albuterol  (DUONEB) 0.5-2.5 (3) MG/3ML nebulizer solution 3 mL (3 mLs Nebulization Given 05/31/24 2003)  ketorolac  (TORADOL ) 30 MG/ML injection 30 mg (30 mg Intravenous Given 05/31/24 2150)     IMPRESSION / MDM / ASSESSMENT AND PLAN / ED COURSE  I reviewed the triage vital signs and the nursing notes.  73 year old female with PMH as noted above presents with acute onset of shortness of breath.  She was found to be hypoxic to the high 80s on room air at home.  She normally uses oxygen  just intermittently when she needs it.  Lung exam reveals significantly diminished breath sounds bilaterally.  The patient has somewhat increased work of breathing but no acute respiratory distress.  Differential diagnosis includes, but is not limited to, COPD exacerbation, acute bronchitis, bronchospasm, pneumonia, COVID-19 or other viral syndrome, less likely cardiac etiology.  We will obtain chest x-ray, lab workup, give bronchodilators, steroid, and reassess.  Patient's presentation is most consistent with acute presentation with potential threat to life or bodily function.  The patient is on the cardiac monitor to evaluate for evidence of arrhythmia and/or significant heart rate changes.   ----------------------------------------- 10:00 PM on 05/31/2024 -----------------------------------------  Chest x-ray does not show any infiltrate.  Respiratory panel is negative.  BNP is negative.  Troponin is negative.  CMP and CBC show no acute findings.  Overall presentation is consistent with COPD exacerbation.  The patient's O2 saturation is now right around 90% on room air.  She states she is feeling significantly better.  However given the severity of her presentation earlier I think it will be most appropriate to admit the patient for further management, she agrees.  I consulted Dr. Hilma from the hospitalist service; based on our discussion he  agrees to evaluate the patient for admission   FINAL CLINICAL IMPRESSION(S) / ED DIAGNOSES   Final diagnoses:  COPD exacerbation (HCC)     Rx / DC Orders   ED Discharge Orders     None        Note:  This document was prepared using Dragon voice recognition software and may include unintentional dictation errors.    Jacolyn Pae, MD 05/31/24 2201

## 2024-05-31 NOTE — H&P (Signed)
 History and Physical    Kelli Phillips FMW:[id number] DOB: [birth year] DOA: 05/31/2024  Referring MD/NP/PA:   PCP: Donzella Lauraine SAILOR, DO   Patient coming from:  The patient is coming from home.     Chief Complaint: SOB  HPI: Kelli Phillips is a 73 y.o. female with medical history significant of COPD on 2 L oxygen  at night, dCHF, lower back pain with sciatica, skin cancer, GERD, who presents with SOB.  Patient states that she has progressively worsening SOB since this evening.  She has cough with greenish colored sputum production.  She has chest tightness, no active chest pain.  No fever or chills.  No recent long distance traveling.  No tenderness in the calf area.  She has history of lower back pain with sciatica, which has worsened since yesterday. No injury. Patient does not have nausea, vomiting, diarrhea or abdominal pain.  No symptoms of UTI.   Data reviewed independently and ED Course: pt was found to have BNP 17.9, negative PCR for COVID, flu and RSV, GFR> 60, troponin 3, temperature normal, blood pressure 144/73, heart rate 85, RR 26, oxygen  saturation 100% on room air.  Chest x-ray negative for infiltration.  Patient is placed in telemetry bed for observation.   EKG: I have personally reviewed.  Sinus rhythm, QTc 618, unstable baseline, anteroseptal infarction pattern   Review of Systems:   General: no fevers, chills, no body weight gain, has fatigue HEENT: no blurry vision, hearing changes or sore throat Respiratory: Has dyspnea, coughing, wheezing CV: Has chest tightness, no palpitations GI: no nausea, vomiting, abdominal pain, diarrhea, constipation GU: no dysuria, burning on urination, increased urinary frequency, hematuria  Ext: no leg edema Neuro: no unilateral weakness, numbness, or tingling, no vision change or hearing loss Skin: no rash, no skin tear. MSK: No muscle spasm, no deformity, no limitation of range of movement in spin.  Has lower back pain Heme: No  easy bruising.  Travel history: No recent long distant travel.   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 Her smoking use included cigarettes. She started smoking about 59 years ago. She has a 43 pack-year smoking history. She has never used smokeless tobacco. She reports that she does not drink alcohol and does not use drugs.  Family History:  Family History  Problem Relation Age of Onset   Hypertension Mother    COPD Mother    Heart failure Mother    Cancer Father    Alcohol abuse Father    Colon cancer Father    Heart failure Sister    Heart attack Brother 59   Lung cancer Brother    Breast cancer Paternal Aunt        ?   Heart failure Maternal Grandmother      Prior to  Admission medications   Medication Sig Start Date End Date Taking? Authorizing Provider  albuterol  (VENTOLIN  HFA) 108 (90 Base) MCG/ACT inhaler Inhale 2 puffs into the lungs every 6 (six) hours as needed for wheezing or shortness of breath. 05/02/24   Isadora Hose, MD  cyanocobalamin  (VITAMIN B12) 1000 MCG tablet Take 2 tablets (2,000 mcg total) by mouth daily. 10/18/23   Hope Merle, MD  Glycopyrrolate -Formoterol  (BEVESPI  AEROSPHERE) 9-4.8 MCG/ACT AERO INHALE 2 PUFFS INTO THE LUNGS TWICE A DAY 05/02/24   Isadora Hose, MD  ondansetron  (ZOFRAN -ODT) 4 MG disintegrating tablet Take 1 tablet (4 mg total) by mouth every 8 (eight) hours as needed for nausea or  vomiting. 05/12/24   Floy Roberts, MD    Physical Exam: Vitals:   05/31/24 2017 05/31/24 2130 05/31/24 2225 05/31/24 2302  BP:  128/73 (!) 143/68 134/65  Pulse: 85 88 94 92  Resp: (!) 24 (!) 37 18 20  Temp:  97.9 F (36.6 C)  (!) 97.5 F (36.4 C)  TempSrc:  Oral    SpO2: 100% 90% 97% 97%  Weight:      Height:       General: Not in acute distress HEENT:       Eyes: PERRL, EOMI, no jaundice       ENT: No discharge from the ears and nose, no pharynx injection, no tonsillar enlargement.        Neck: No JVD, no bruit, no mass felt. Heme: No neck lymph node enlargement. Cardiac: S1/S2, RRR, No murmurs, No gallops or rubs. Respiratory: Has severely decreased air movement bilaterally, with minimal wheezing GI: Soft, nondistended, nontender, no rebound pain, no organomegaly, BS present. GU: No hematuria Ext: No pitting leg edema bilaterally. 1+DP/PT pulse bilaterally. Musculoskeletal: No joint deformities, No joint redness or warmth, no limitation of ROM in spin. Skin: No rashes.  Neuro: Alert, oriented X3, cranial nerves II-XII grossly intact, moves all extremities normally.  Psych: Patient is not psychotic, no suicidal or hemocidal ideation.  Labs on Admission: I have personally reviewed following labs and imaging studies  CBC: Recent Labs  Lab 05/31/24 2001  WBC 4.7  NEUTROABS 2.5  HGB 14.1  HCT 42.6  MCV 89.5  PLT 203   Basic Metabolic Panel: Recent Labs  Lab 05/31/24 2001  NA 138  K 3.8  CL 100  CO2 27  GLUCOSE 105*  BUN 12  CREATININE 0.45  CALCIUM 9.1   GFR: Estimated Creatinine Clearance: 50.1 mL/min (by C-G formula based on SCr of 0.45 mg/dL). Liver Function Tests: Recent Labs  Lab 05/31/24 2001  AST 17  ALT 11  ALKPHOS 69  BILITOT 0.7  PROT 6.4*  ALBUMIN 3.6   No results for input(s): LIPASE, AMYLASE in the last 168 hours. No results for input(s): AMMONIA in the last 168 hours. Coagulation Profile: No results for input(s): INR,  PROTIME in the last 168 hours. Cardiac Enzymes: No results for input(s): CKTOTAL, CKMB, CKMBINDEX, TROPONINI in the last 168 hours. BNP (last 3 results) No results for input(s): PROBNP in the last 8760 hours. HbA1C: No results for input(s): HGBA1C in the last 72 hours. CBG: No results for input(s): GLUCAP in the last 168 hours. Lipid Profile: No results for input(s): CHOL, HDL, LDLCALC, TRIG, CHOLHDL, LDLDIRECT in the last 72 hours. Thyroid Function Tests: No results for input(s): TSH, T4TOTAL, FREET4, T3FREE, THYROIDAB in the last 72 hours. Anemia Panel: No results for input(s): VITAMINB12, FOLATE, FERRITIN, TIBC, IRON, RETICCTPCT in the last 72 hours. Urine analysis:  Component Value Date/Time   COLORURINE YELLOW (A) 04/27/2024 1730   APPEARANCEUR CLEAR (A) 04/27/2024 1730   APPEARANCEUR Clear 09/04/2014 1811   LABSPEC 1.014 04/27/2024 1730   LABSPEC 1.019 09/04/2014 1811   PHURINE 5.0 04/27/2024 1730   GLUCOSEU NEGATIVE 04/27/2024 1730   GLUCOSEU Negative 09/04/2014 1811   HGBUR SMALL (A) 04/27/2024 1730   BILIRUBINUR NEGATIVE 04/27/2024 1730   BILIRUBINUR neg 04/13/2023 1037   BILIRUBINUR Negative 09/04/2014 1811   KETONESUR NEGATIVE 04/27/2024 1730   PROTEINUR NEGATIVE 04/27/2024 1730   UROBILINOGEN 0.2 04/13/2023 1037   NITRITE NEGATIVE 04/27/2024 1730   LEUKOCYTESUR TRACE (A) 04/27/2024 1730   LEUKOCYTESUR Negative 09/04/2014 1811   Sepsis Labs: @LABRCNTIP (procalcitonin:4,lacticidven:4) ) Recent Results (from the past 240 hours)  Resp panel by RT-PCR (RSV, Flu A&B, Covid) Anterior Nasal Swab     Status: None   Collection Time: 05/31/24  8:01 PM   Specimen: Anterior Nasal Swab  Result Value Ref Range Status   SARS Coronavirus 2 by RT PCR NEGATIVE NEGATIVE Final    Comment: (NOTE) SARS-CoV-2 target nucleic acids are NOT DETECTED.  The SARS-CoV-2 RNA is generally detectable in upper respiratory specimens during  the acute phase of infection. The lowest concentration of SARS-CoV-2 viral copies this assay can detect is 138 copies/mL. A negative result does not preclude SARS-Cov-2 infection and should not be used as the sole basis for treatment or other patient management decisions. A negative result may occur with  improper specimen collection/handling, submission of specimen other than nasopharyngeal swab, presence of viral mutation(s) within the areas targeted by this assay, and inadequate number of viral copies(<138 copies/mL). A negative result must be combined with clinical observations, patient history, and epidemiological information. The expected result is Negative.  Fact Sheet for Patients:  BloggerCourse.com  Fact Sheet for Healthcare Providers:  SeriousBroker.it  This test is no t yet approved or cleared by the [place]  FDA and  has been authorized for detection and/or diagnosis of SARS-CoV-2 by FDA under an Emergency Use Authorization (EUA). This EUA will remain  in effect (meaning this test can be used) for the duration of the COVID-19 declaration under Section 564(b)(1) of the Act, 21 U.S.C.section 360bbb-3(b)(1), unless the authorization is terminated  or revoked sooner.       Influenza A by PCR NEGATIVE NEGATIVE Final   Influenza B by PCR NEGATIVE NEGATIVE Final    Comment: (NOTE) The Xpert Xpress SARS-CoV-2/FLU/RSV plus assay is intended as an aid in the diagnosis of influenza from Nasopharyngeal swab specimens and should not be used as a sole basis for treatment. Nasal washings and aspirates are unacceptable for Xpert Xpress SARS-CoV-2/FLU/RSV testing.  Fact Sheet for Patients: BloggerCourse.com  Fact Sheet for Healthcare Providers: SeriousBroker.it  This test is not yet approved or cleared by the [place]  FDA and has been authorized for detection and/or  diagnosis of SARS-CoV-2 by FDA under an Emergency Use Authorization (EUA). This EUA will remain in effect (meaning this test can be used) for the duration of the COVID-19 declaration under Section 564(b)(1) of the Act, 21 U.S.C. section 360bbb-3(b)(1), unless the authorization is terminated or revoked.     Resp Syncytial Virus by PCR NEGATIVE NEGATIVE Final    Comment: (NOTE) Fact Sheet for Patients: BloggerCourse.com  Fact Sheet for Healthcare Providers: SeriousBroker.it  This test is not yet approved or cleared by the [place]  FDA and has been authorized for detection and/or diagnosis of SARS-CoV-2 by FDA under an Emergency Use Authorization (EUA). This EUA  will remain in effect (meaning this test can be used) for the duration of the COVID-19 declaration under Section 564(b)(1) of the Act, 21 U.S.C. section 360bbb-3(b)(1), unless the authorization is terminated or revoked.  Performed at [facility], [address] [place], [place] [address]      Radiological Exams on Admission:   Assessment/Plan Principal Problem:   COPD exacerbation (HCC) Active Problems:   Chronic diastolic CHF (congestive heart failure) (HCC)   Midline low back pain with sciatica   Assessment and Plan:   COPD exacerbation [facility]): Patient has SOB, productive cough, with severely decreased air movement on auscultation and mild wheezing, clinically consistent with COPD exacerbation.  No infiltrate on chest x-ray.  No new oxygen  requirement.  -Place in tele bed for obs -Bronchodilators and prn Mucinex  -Solu-Medrol  80 mg IV daily after given 125 mg of Solu-Medrol  -Z-pak -Incentive spirometry -Follow up sputum culture  Chronic diastolic CHF (congestive heart failure) (HCC): 2D echo on 10/23/2022 showed EF of 55 to 60% with grade 1 diastolic dysfunction.  Patient does not have leg edema JVD.  BNP normal at 17.9.  CHF is compensated. -  Watch volume status closely  Midline low back pain with sciatica - Lidoderm  patch - As needed Tylenol  - As needed Flexeril        DVT ppx: SQ Lovenox   Code Status: Full code  Family Communication:     not done, no family member is at bed side.   Disposition Plan:  Anticipate discharge back to previous environment  Consults called:  none  Admission status and Level of care: Telemetry Medical:    for obs     Dispo: The patient is from: Home              Anticipated d/c is to: Home              Anticipated d/c date is: 1 day              Patient currently is not medically stable to d/c.    Severity of Illness:  The appropriate patient status for this patient is OBSERVATION. Observation status is judged to be reasonable and necessary in order to provide the required intensity of service to ensure the patient's safety. The patient's presenting symptoms, physical exam findings, and initial radiographic and laboratory data in the context of their medical condition is felt to place them at decreased risk for further clinical deterioration. Furthermore, it is anticipated that the patient will be medically stable for discharge from the hospital within 2 midnights of admission.        Date of Service 05/31/2024    Caleb Exon [facility]   If 7PM-7AM, please contact night-coverage [email or web address] 05/31/2024, 11:06 PM
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
## 2024-05-31 NOTE — Patient Instructions (Addendum)
 Medication Changes:  No medication changes.   Lab Work:  Go downstairs to [place] [facility] on LOWER LEVEL to have your blood work completed.  We will only call you if the results are abnormal or if the provider would like to make medication changes.  No news is good news.   Special Instructions // Education:  Wear compression socks daily and remove them at bedtime.   Procedure:  Please have your echo completed. You will check in for this in the [facility] at [facility] on the LOWER LEVEL. You have to arrive 15 MINS EARLY for preparation, otherwise you will have to reschedule. They will call you to get this scheduled. However, if you'd like to reach out to them instead, their number is [phone].    Follow-Up in: 2 months with Ellouise Class, FNP.   Thank you for choosing [facility].    At the [facility], you and your health needs are our priority. We have a designated team specialized in the treatment of Heart Failure. This Care Team includes your primary Heart Failure Specialized Cardiologist (physician), Advanced Practice Providers (APPs- Physician Assistants and Nurse Practitioners), and Pharmacist who all work together to provide you with the care you need, when you need it.   You may see any of the following providers on your designated Care Team at your next follow up:  Dr. Toribio Fuel Dr. Ezra Shuck Dr. Ria Commander Dr. Morene Brownie Ellouise Class, FNP Jaun Bash, RPH-CPP  Please be sure to bring in all your medications bottles to every appointment.   Need to Contact Us :  If you have any questions or concerns before your next appointment please send us  a message through [place] or call our office at [phone].    TO LEAVE A MESSAGE FOR THE NURSE SELECT OPTION 2, PLEASE LEAVE A MESSAGE INCLUDING: YOUR NAME DATE OF BIRTH CALL BACK NUMBER REASON FOR CALL**this is important as we  prioritize the call backs  YOU WILL RECEIVE A CALL BACK THE SAME DAY AS LONG AS YOU CALL BEFORE 4:00 PM

## 2024-06-01 ENCOUNTER — Observation Stay

## 2024-06-01 ENCOUNTER — Ambulatory Visit: Payer: Self-pay | Admitting: Family

## 2024-06-01 DIAGNOSIS — G8929 Other chronic pain: Secondary | ICD-10-CM | POA: Diagnosis present

## 2024-06-01 DIAGNOSIS — M810 Age-related osteoporosis without current pathological fracture: Secondary | ICD-10-CM | POA: Diagnosis present

## 2024-06-01 DIAGNOSIS — Z9981 Dependence on supplemental oxygen: Secondary | ICD-10-CM | POA: Diagnosis not present

## 2024-06-01 DIAGNOSIS — Z885 Allergy status to narcotic agent status: Secondary | ICD-10-CM | POA: Diagnosis not present

## 2024-06-01 DIAGNOSIS — Z880 Allergy status to penicillin: Secondary | ICD-10-CM | POA: Diagnosis not present

## 2024-06-01 DIAGNOSIS — M5440 Lumbago with sciatica, unspecified side: Secondary | ICD-10-CM | POA: Diagnosis present

## 2024-06-01 DIAGNOSIS — Z87891 Personal history of nicotine dependence: Secondary | ICD-10-CM | POA: Diagnosis not present

## 2024-06-01 DIAGNOSIS — Z800 Family history of malignant neoplasm of digestive organs: Secondary | ICD-10-CM | POA: Diagnosis not present

## 2024-06-01 DIAGNOSIS — M4804 Spinal stenosis, thoracic region: Secondary | ICD-10-CM | POA: Diagnosis present

## 2024-06-01 DIAGNOSIS — Z811 Family history of alcohol abuse and dependence: Secondary | ICD-10-CM | POA: Diagnosis not present

## 2024-06-01 DIAGNOSIS — Z8249 Family history of ischemic heart disease and other diseases of the circulatory system: Secondary | ICD-10-CM | POA: Diagnosis not present

## 2024-06-01 DIAGNOSIS — Z1152 Encounter for screening for COVID-19: Secondary | ICD-10-CM | POA: Diagnosis not present

## 2024-06-01 DIAGNOSIS — Z801 Family history of malignant neoplasm of trachea, bronchus and lung: Secondary | ICD-10-CM | POA: Diagnosis not present

## 2024-06-01 DIAGNOSIS — J439 Emphysema, unspecified: Secondary | ICD-10-CM | POA: Diagnosis present

## 2024-06-01 DIAGNOSIS — I5032 Chronic diastolic (congestive) heart failure: Secondary | ICD-10-CM | POA: Diagnosis present

## 2024-06-01 DIAGNOSIS — M5124 Other intervertebral disc displacement, thoracic region: Secondary | ICD-10-CM | POA: Diagnosis present

## 2024-06-01 DIAGNOSIS — J441 Chronic obstructive pulmonary disease with (acute) exacerbation: Secondary | ICD-10-CM | POA: Diagnosis present

## 2024-06-01 DIAGNOSIS — F419 Anxiety disorder, unspecified: Secondary | ICD-10-CM | POA: Diagnosis present

## 2024-06-01 DIAGNOSIS — Z882 Allergy status to sulfonamides status: Secondary | ICD-10-CM | POA: Diagnosis not present

## 2024-06-01 DIAGNOSIS — Z85828 Personal history of other malignant neoplasm of skin: Secondary | ICD-10-CM | POA: Diagnosis not present

## 2024-06-01 DIAGNOSIS — Z825 Family history of asthma and other chronic lower respiratory diseases: Secondary | ICD-10-CM | POA: Diagnosis not present

## 2024-06-01 DIAGNOSIS — Z803 Family history of malignant neoplasm of breast: Secondary | ICD-10-CM | POA: Diagnosis not present

## 2024-06-01 DIAGNOSIS — Z79899 Other long term (current) drug therapy: Secondary | ICD-10-CM | POA: Diagnosis not present

## 2024-06-01 LAB — BASIC METABOLIC PANEL WITH GFR
BUN/Creatinine Ratio: 16 (ref 12–28)
BUN: 10 mg/dL (ref 8–27)
CO2: 25 mmol/L (ref 20–29)
Calcium: 9.5 mg/dL (ref 8.7–10.3)
Chloride: 99 mmol/L (ref 96–106)
Creatinine, Ser: 0.64 mg/dL (ref 0.57–1.00)
Glucose: 94 mg/dL (ref 70–99)
Potassium: 4.5 mmol/L (ref 3.5–5.2)
Sodium: 138 mmol/L (ref 134–144)
eGFR: 92 mL/min/1.73 (ref >59)

## 2024-06-01 LAB — CBC
HCT: 38.9 % (ref 36.0–46.0)
Hemoglobin: 12.7 g/dL (ref 12.0–15.0)
MCH: 29.2 pg (ref 26.0–34.0)
MCHC: 32.6 g/dL (ref 30.0–36.0)
MCV: 89.4 fL (ref 80.0–100.0)
Platelets: 210 10*3/uL (ref 150–400)
RBC: 4.35 MIL/uL (ref 3.87–5.11)
RDW: 12.2 % (ref 11.5–15.5)
WBC: 2.9 10*3/uL — ABNORMAL LOW (ref 4.0–10.5)
nRBC: 0 % (ref 0.0–0.2)

## 2024-06-01 LAB — MAGNESIUM: Magnesium: 2 mg/dL (ref 1.6–2.3)

## 2024-06-01 MED ORDER — OXYCODONE-ACETAMINOPHEN 5-325 MG PO TABS
1.0000 | ORAL_TABLET | Freq: Four times a day (QID) | ORAL | Status: DC | PRN
  Administered 2024-06-01: 1 via ORAL
  Filled 2024-06-01: qty 1

## 2024-06-01 MED ORDER — IPRATROPIUM-ALBUTEROL 0.5-2.5 (3) MG/3ML IN SOLN
3.0000 mL | Freq: Two times a day (BID) | RESPIRATORY_TRACT | Status: DC
  Administered 2024-06-01: 3 mL via RESPIRATORY_TRACT
  Filled 2024-06-01: qty 3

## 2024-06-01 MED ORDER — HYDROMORPHONE HCL 1 MG/ML IJ SOLN: 1.0000 mg | INTRAMUSCULAR | Status: DC | PRN
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
## 2024-06-01 NOTE — Care Management Obs Status (Signed)
 MEDICARE OBSERVATION STATUS NOTIFICATION   Patient Details  Name: Kelli Phillips MRN: [id number] Date of Birth: 12-Jul-1951   Medicare Observation Status Notification Given:  Yes    Hilery Wintle W, CMA 06/01/2024, 11:56 AM

## 2024-06-01 NOTE — Plan of Care (Signed)

## 2024-06-01 NOTE — Progress Notes (Signed)
 PROGRESS NOTE    Kelli Phillips  FMW:[id number] DOB: 11/08/1950 DOA: 05/31/2024 PCP: Donzella Lauraine SAILOR, DO    Brief Narrative:   Assessment & Plan:   Principal Problem:   COPD exacerbation (HCC) Active Problems:   Chronic diastolic CHF (congestive heart failure) (HCC)   Midline low back pain with sciatica  Assessment and Plan: COPD exacerbation: continue on IV steroids, azithromycin , bronchodilators & encourage incentive spirometry   Chronic diastolic CHF: echo on 10/23/2022 showed EF of 55 to 60% with grade 1 diastolic dysfunction. CHF appears compensated. Monitor I/Os. Not on any meds at home for this   Chronic back pain: etiology unclear. Percocet, flexeril  & dilaudid prn. CT T spine ordered        DVT prophylaxis: lovenox   Code Status: full  Family Communication:  Disposition Plan: likely d/c back home  Status is: Inpatient Remains inpatient appropriate because: severity of illness    Level of care: Telemetry Medical Consultants:    Procedures:   Antimicrobials:    Subjective: Pt c/o wheezing & back pain   Objective: Vitals:   05/31/24 2302 06/01/24 0149 06/01/24 0432 06/01/24 0728  BP: 134/65  104/62   Pulse: 92  73   Resp: 20  20   Temp: (!) 97.5 F (36.4 C)  97.6 F (36.4 C)   TempSrc:      SpO2: 97% 97% 100% 98%  Weight:      Height:       No intake or output data in the 24 hours ending 06/01/24 0746 Filed Weights   05/31/24 1945  Weight: 52.2 kg    Examination:  General exam: Appears calm and comfortable  Respiratory system: course breath sounds b/l. Wheezing b/l  Cardiovascular system: S1 & S2+. No rubs, gallops or clicks.  Gastrointestinal system: Abdomen is nondistended, soft and nontender. Normal bowel sounds heard. Central nervous system: Alert and oriented. Moves all extremities  Psychiatry: Judgement and insight appear normal. Mood & affect appropriate.     Data Reviewed: I have personally reviewed following labs and  imaging studies  CBC: Recent Labs  Lab 05/31/24 2001 06/01/24 0532  WBC 4.7 2.9*  NEUTROABS 2.5  --   HGB 14.1 12.7  HCT 42.6 38.9  MCV 89.5 89.4  PLT 203 210   Basic Metabolic Panel: Recent Labs  Lab 05/31/24 1105 05/31/24 2001  NA 138 138  K 4.5 3.8  CL 99 100  CO2 25 27  GLUCOSE 94 105*  BUN 10 12  CREATININE 0.64 0.45  CALCIUM 9.5 9.1  MG 2.0  --    GFR: Estimated Creatinine Clearance: 50.1 mL/min (by C-G formula based on SCr of 0.45 mg/dL). Liver Function Tests: Recent Labs  Lab 05/31/24 2001  AST 17  ALT 11  ALKPHOS 69  BILITOT 0.7  PROT 6.4*  ALBUMIN 3.6   No results for input(s): LIPASE, AMYLASE in the last 168 hours. No results for input(s): AMMONIA in the last 168 hours. Coagulation Profile: No results for input(s): INR, PROTIME in the last 168 hours. Cardiac Enzymes: No results for input(s): CKTOTAL, CKMB, CKMBINDEX, TROPONINI in the last 168 hours. BNP (last 3 results) No results for input(s): PROBNP in the last 8760 hours. HbA1C: No results for input(s): HGBA1C in the last 72 hours. CBG: No results for input(s): GLUCAP in the last 168 hours. Lipid Profile: No results for input(s): CHOL, HDL, LDLCALC, TRIG, CHOLHDL, LDLDIRECT in the last 72 hours. Thyroid Function Tests: No results for input(s): TSH, T4TOTAL, FREET4,  T3FREE, THYROIDAB in the last 72 hours. Anemia Panel: No results for input(s): VITAMINB12, FOLATE, FERRITIN, TIBC, IRON, RETICCTPCT in the last 72 hours. Sepsis Labs: No results for input(s): PROCALCITON, LATICACIDVEN in the last 168 hours.  Recent Results (from the past 240 hours)  Resp panel by RT-PCR (RSV, Flu A&B, Covid) Anterior Nasal Swab     Status: None   Collection Time: 05/31/24  8:01 PM   Specimen: Anterior Nasal Swab  Result Value Ref Range Status   SARS Coronavirus 2 by RT PCR NEGATIVE NEGATIVE Final    Comment: (NOTE) SARS-CoV-2 target nucleic  acids are NOT DETECTED.  The SARS-CoV-2 RNA is generally detectable in upper respiratory specimens during the acute phase of infection. The lowest concentration of SARS-CoV-2 viral copies this assay can detect is 138 copies/mL. A negative result does not preclude SARS-Cov-2 infection and should not be used as the sole basis for treatment or other patient management decisions. A negative result may occur with  improper specimen collection/handling, submission of specimen other than nasopharyngeal swab, presence of viral mutation(s) within the areas targeted by this assay, and inadequate number of viral copies(<138 copies/mL). A negative result must be combined with clinical observations, patient history, and epidemiological information. The expected result is Negative.  Fact Sheet for Patients:  BloggerCourse.com  Fact Sheet for Healthcare Providers:  SeriousBroker.it  This test is no t yet approved or cleared by the [place]  FDA and  has been authorized for detection and/or diagnosis of SARS-CoV-2 by FDA under an Emergency Use Authorization (EUA). This EUA will remain  in effect (meaning this test can be used) for the duration of the COVID-19 declaration under Section 564(b)(1) of the Act, 21 U.S.C.section 360bbb-3(b)(1), unless the authorization is terminated  or revoked sooner.       Influenza A by PCR NEGATIVE NEGATIVE Final   Influenza B by PCR NEGATIVE NEGATIVE Final    Comment: (NOTE) The Xpert Xpress SARS-CoV-2/FLU/RSV plus assay is intended as an aid in the diagnosis of influenza from Nasopharyngeal swab specimens and should not be used as a sole basis for treatment. Nasal washings and aspirates are unacceptable for Xpert Xpress SARS-CoV-2/FLU/RSV testing.  Fact Sheet for Patients: BloggerCourse.com  Fact Sheet for Healthcare Providers: SeriousBroker.it  This  test is not yet approved or cleared by the [place]  FDA and has been authorized for detection and/or diagnosis of SARS-CoV-2 by FDA under an Emergency Use Authorization (EUA). This EUA will remain in effect (meaning this test can be used) for the duration of the COVID-19 declaration under Section 564(b)(1) of the Act, 21 U.S.C. section 360bbb-3(b)(1), unless the authorization is terminated or revoked.     Resp Syncytial Virus by PCR NEGATIVE NEGATIVE Final    Comment: (NOTE) Fact Sheet for Patients: BloggerCourse.com  Fact Sheet for Healthcare Providers: SeriousBroker.it  This test is not yet approved or cleared by the [place]  FDA and has been authorized for detection and/or diagnosis of SARS-CoV-2 by FDA under an Emergency Use Authorization (EUA). This EUA will remain in effect (meaning this test can be used) for the duration of the COVID-19 declaration under Section 564(b)(1) of the Act, 21 U.S.C. section 360bbb-3(b)(1), unless the authorization is terminated or revoked.  Performed at [facility], [address] [place], [place] [address]          Radiology Studies: DG Chest [place] 1 View Result Date: 05/31/2024 CLINICAL DATA:  10026 Shortness of breath 10026 EXAM: PORTABLE CHEST - 1 VIEW COMPARISON:  05/20/2024  FINDINGS: Emphysema. No focal airspace consolidation, pleural effusion, or pneumothorax. No cardiomegaly. Tortuous aorta with aortic atherosclerosis. No acute fracture or destructive lesions. Multilevel thoracic osteophytosis. IMPRESSION: Emphysema. No pneumonia or pulmonary edema. Electronically Signed   By: Rogelia Myers M.D.   On: 05/31/2024 20:07        Scheduled Meds:  azithromycin   500 mg Oral Daily   Followed by   NOREEN ON 06/02/2024] azithromycin   250 mg Oral Daily   enoxaparin  (LOVENOX ) injection  40 mg Subcutaneous Q24H   feeding supplement  237 mL Oral BID BM    ipratropium-albuterol   3 mL Nebulization BID   lidocaine   1 patch Transdermal Q24H   methylPREDNISolone  (SOLU-MEDROL ) injection  80 mg Intravenous Daily   Continuous Infusions:   LOS: 0 days       Anthony CHRISTELLA Pouch, MD [facility] Pager [phone]  If 7PM-7AM, please contact night-coverage [email or web address] 06/01/2024, 7:46 AM

## 2024-06-01 NOTE — Progress Notes (Signed)
..  Brief Assessment Note  Patient is from home c/o sob, on 2 L [place] at baseline. Medical record reviewed and patient has no TOC needs at this time. Please outreach to [facility] if needs are identified.       Patient Goals and CMS Choice        Expected Discharge Plan and Services                                                Prior Living Arrangements/Services                       Activities of Daily Living   ADL Screening (condition at time of admission) Independently performs ADLs?: Yes (appropriate for developmental age) Is the patient deaf or have difficulty hearing?: No Does the patient have difficulty seeing, even when wearing glasses/contacts?: No Does the patient have difficulty concentrating, remembering, or making decisions?: No  Permission Sought/Granted                  Emotional Assessment              Admission diagnosis:  COPD exacerbation (HCC) [J44.1] Patient Active Problem List   Diagnosis Date Noted   COPD exacerbation (HCC) 05/31/2024   Chronic diastolic CHF (congestive heart failure) (HCC) 05/31/2024   Colon cancer screening 10/12/2023   Breast cancer screening by mammogram 10/12/2023   Postmenopausal estrogen deficiency 10/12/2023   Need for hepatitis C screening test 10/12/2023   Vitamin D  deficiency 10/12/2023   Vitamin B 12 deficiency 10/12/2023   Abnormal glucose 10/12/2023   Anemia 10/12/2023   Chronic respiratory failure with hypoxia (HCC) 12/25/2022   Acute hypoxemic respiratory failure (HCC) 10/02/2021   COPD with acute exacerbation (HCC) 10/01/2021   Acute gastritis 10/01/2021   Nocturnal hypoxia 11/08/2020   Aortic atherosclerosis 11/08/2020   Allergic rhinitis 05/20/2020   Personal history of tobacco use, presenting hazards to health 05/12/2018   Adenomatous colon polyp 08/14/2014   Midline low back pain with sciatica 08/14/2014   Pulmonary emphysema (HCC) 08/14/2014   COPD, very severe (HCC) 05/16/2014    Rectal bleeding 01/30/2014   PCP:  Donzella Lauraine SAILOR, DO Pharmacy:   [facility] [address] [place] - [address] [place] [address] Phone: [phone] Fax: [phone]  [phone]), [place] - [address] [place]) [place] [address] Phone: [phone] Fax: [phone]     Social Determinants of Health (SDOH) Interventions    Readmission Risk Interventions     No data to display

## 2024-06-02 DIAGNOSIS — J441 Chronic obstructive pulmonary disease with (acute) exacerbation: Secondary | ICD-10-CM | POA: Diagnosis not present

## 2024-06-02 MED ORDER — LORATADINE 10 MG PO TABS
10.0000 mg | ORAL_TABLET | Freq: Every day | ORAL | Status: DC
  Administered 2024-06-02 – 2024-06-03 (×2): 10 mg via ORAL
  Filled 2024-06-02 (×2): qty 1

## 2024-06-02 MED ORDER — PHENOL 1.4 % MT LIQD
1.0000 | OROMUCOSAL | Status: DC | PRN
  Administered 2024-06-02: 1 via OROMUCOSAL
  Filled 2024-06-02: qty 177

## 2024-06-02 NOTE — Plan of Care (Signed)

## 2024-06-02 NOTE — Progress Notes (Signed)
 Initial Nutrition Assessment  DOCUMENTATION CODES:   Not applicable  INTERVENTION:   -Continue 2 gram sodium diet -MVI with minerals daily -D/c Ensure Plus High Protein po BID, each supplement provides 350 kcal and 20 grams of protein   NUTRITION DIAGNOSIS:   Increased nutrient needs related to chronic illness (COPD) as evidenced by estimated needs.  GOAL:   Patient will meet greater than or equal to 90% of their needs  MONITOR:   PO intake, Supplement acceptance  REASON FOR ASSESSMENT:   Malnutrition Screening Tool    ASSESSMENT:   Pt with medical history significant of COPD on 2 L oxygen  at night, dCHF, lower back pain with sciatica, skin cancer, GERD, who presents with shortness of breath.  Pt admitted with COPD exacerbation.  Reviewed I/O's: +480 ml x 24 hours  Pt unavailable at time of visit. Attempted to speak with pt via call to hospital room phone, however, unable to reach. RD unable to obtain further nutrition-related history or complete nutrition-focused physical exam at this time.    Pt currently on a regular diet. Noted meal completions 100%. Case discussed with RN, who reports pt with a good appetite. She consumed 100% of breakfast, but refused Ensure supplement.   No wt loss noted over the past month.  Medications reviewed and include lovenox , claritin , and solu-medrol .   Labs reviewed.   Diet Order:   Diet Order             Diet 2 gram sodium Fluid consistency: Thin  Diet effective now                   EDUCATION NEEDS:   No education needs have been identified at this time  Skin:  Skin Assessment: Reviewed RN Assessment  Last BM:  06/01/24  Height:   Ht Readings from Last 1 Encounters:  05/31/24 5' 4 (1.626 m)    Weight:   Wt Readings from Last 1 Encounters:  06/02/24 53.5 kg    Ideal Body Weight:  54.5 kg  BMI:  Body mass index is 20.25 kg/m.  Estimated Nutritional Needs:   Kcal:  1600-1800  Protein:  85-100  grams  Fluid:  1.6-1.8 L    Margery ORN, RD, LDN, CDCES Registered Dietitian III Certified Diabetes Care and Education Specialist If unable to reach this RD, please use RD Inpatient group chat on secure chat between hours of 8am-4 pm daily

## 2024-06-02 NOTE — TOC Progression Note (Signed)
 Transition of Care [facility]) - Progression Note    Patient Details  Name: Kelli Phillips MRN: [id number] Date of Birth: Dec 16, 1950  [facility] of [facility]) CM/SW Contact  Dalia GORMAN Fuse, RN Phone Number: 06/02/2024, 9:12 AM  Clinical Narrative:     Remains on IV abx and IV steroids, monitoring I's and O's, on 2 L [place] at baseline. No TOC needs.                    Expected Discharge Plan and Services                                               Social Drivers of Health (SDOH) Interventions SDOH Screenings   Food Insecurity: No Food Insecurity (05/31/2024)  Recent Concern: Food Insecurity - Food Insecurity Present (05/31/2024)  Housing: Low Risk  (05/31/2024)  Transportation Needs: No Transportation Needs (05/31/2024)  Utilities: Not At Risk (05/31/2024)  Alcohol Screen: Low Risk  (02/18/2024)  Depression (PHQ2-9): Low Risk  (02/18/2024)  Financial Resource Strain: Medium Risk (05/31/2024)  Physical Activity: Insufficiently Active (02/18/2024)  Social Connections: Moderately Isolated (05/31/2024)  Stress: No Stress Concern Present (02/18/2024)  Tobacco Use: Medium Risk (05/31/2024)  Health Literacy: Inadequate Health Literacy (05/31/2024)    Readmission Risk Interventions     No data to display

## 2024-06-02 NOTE — Progress Notes (Signed)
 PROGRESS NOTE    Kelli Phillips  FMW:[id number] DOB: 1950/10/04 DOA: 05/31/2024 PCP: Donzella Lauraine SAILOR, DO    Brief Narrative:   Assessment & Plan:   Principal Problem:   COPD exacerbation (HCC) Active Problems:   Chronic diastolic CHF (congestive heart failure) (HCC)   Midline low back pain with sciatica  Assessment and Plan: COPD exacerbation: continue on IV steroids, azithromycin , bronchodilators & encourage incentive spirometry    Chronic diastolic CHF: echo on 10/23/2022 showed EF of 55 to 60% with grade 1 diastolic dysfunction. CHF appears compensated. Monitor I/Os. Not on any meds at home for this   Chronic back pain: etiology unclear. Percocet, flexeril  & dilaudid prn. CT T spine shows mod central canal stenosis at T6-T7 due to a central disc protrusion. Continue w/ steroids. Will likely need to see neuro surg outpatient         DVT prophylaxis: lovenox   Code Status: full  Family Communication:  Disposition Plan: likely d/c back home  Status is: Inpatient Remains inpatient appropriate because: severity of illness    Level of care: Telemetry Medical Consultants:    Procedures:   Antimicrobials:    Subjective: Pt c/o back pain & sore throat  Objective: Vitals:   06/02/24 0014 06/02/24 0352 06/02/24 0500 06/02/24 0733  BP: 102/62 (!) 111/55  116/66  Pulse: 67 (!) 56  (!) 59  Resp: 20 16  18   Temp: 97.6 F (36.4 C) 97.7 F (36.5 C)  97.9 F (36.6 C)  TempSrc:      SpO2: 99% 97%  98%  Weight:   53.5 kg   Height:        Intake/Output Summary (Last 24 hours) at 06/02/2024 0832 Last data filed at 06/01/2024 1300 Gross per 24 hour  Intake 480 ml  Output --  Net 480 ml   Filed Weights   05/31/24 1945 06/02/24 0500  Weight: 52.2 kg 53.5 kg    Examination:  General exam: Appears comfortable  Respiratory system: diminished breath sounds b/l. Intermittent wheezes b/l Cardiovascular system: S1/S2+. No rubs or clicks  Gastrointestinal system:  abd is soft, NT, ND & hypoactive bowel sounds Central nervous system: alert & oriented. Moves all extremities  Psychiatry: Judgement and insight appears normal. Appropriate mood and affect    Data Reviewed: I have personally reviewed following labs and imaging studies  CBC: Recent Labs  Lab 05/31/24 2001 06/01/24 0532  WBC 4.7 2.9*  NEUTROABS 2.5  --   HGB 14.1 12.7  HCT 42.6 38.9  MCV 89.5 89.4  PLT 203 210   Basic Metabolic Panel: Recent Labs  Lab 05/31/24 1105 05/31/24 2001  NA 138 138  K 4.5 3.8  CL 99 100  CO2 25 27  GLUCOSE 94 105*  BUN 10 12  CREATININE 0.64 0.45  CALCIUM 9.5 9.1  MG 2.0  --    GFR: Estimated Creatinine Clearance: 51.3 mL/min (by C-G formula based on SCr of 0.45 mg/dL). Liver Function Tests: Recent Labs  Lab 05/31/24 2001  AST 17  ALT 11  ALKPHOS 69  BILITOT 0.7  PROT 6.4*  ALBUMIN 3.6   No results for input(s): LIPASE, AMYLASE in the last 168 hours. No results for input(s): AMMONIA in the last 168 hours. Coagulation Profile: No results for input(s): INR, PROTIME in the last 168 hours. Cardiac Enzymes: No results for input(s): CKTOTAL, CKMB, CKMBINDEX, TROPONINI in the last 168 hours. BNP (last 3 results) No results for input(s): PROBNP in the last 8760 hours. HbA1C:  No results for input(s): HGBA1C in the last 72 hours. CBG: No results for input(s): GLUCAP in the last 168 hours. Lipid Profile: No results for input(s): CHOL, HDL, LDLCALC, TRIG, CHOLHDL, LDLDIRECT in the last 72 hours. Thyroid Function Tests: No results for input(s): TSH, T4TOTAL, FREET4, T3FREE, THYROIDAB in the last 72 hours. Anemia Panel: No results for input(s): VITAMINB12, FOLATE, FERRITIN, TIBC, IRON, RETICCTPCT in the last 72 hours. Sepsis Labs: No results for input(s): PROCALCITON, LATICACIDVEN in the last 168 hours.  Recent Results (from the past 240 hours)  Resp panel by RT-PCR (RSV, Flu  A&B, Covid) Anterior Nasal Swab     Status: None   Collection Time: 05/31/24  8:01 PM   Specimen: Anterior Nasal Swab  Result Value Ref Range Status   SARS Coronavirus 2 by RT PCR NEGATIVE NEGATIVE Final    Comment: (NOTE) SARS-CoV-2 target nucleic acids are NOT DETECTED.  The SARS-CoV-2 RNA is generally detectable in upper respiratory specimens during the acute phase of infection. The lowest concentration of SARS-CoV-2 viral copies this assay can detect is 138 copies/mL. A negative result does not preclude SARS-Cov-2 infection and should not be used as the sole basis for treatment or other patient management decisions. A negative result may occur with  improper specimen collection/handling, submission of specimen other than nasopharyngeal swab, presence of viral mutation(s) within the areas targeted by this assay, and inadequate number of viral copies(<138 copies/mL). A negative result must be combined with clinical observations, patient history, and epidemiological information. The expected result is Negative.  Fact Sheet for Patients:  BloggerCourse.com  Fact Sheet for Healthcare Providers:  SeriousBroker.it  This test is no t yet approved or cleared by the [place]  FDA and  has been authorized for detection and/or diagnosis of SARS-CoV-2 by FDA under an Emergency Use Authorization (EUA). This EUA will remain  in effect (meaning this test can be used) for the duration of the COVID-19 declaration under Section 564(b)(1) of the Act, 21 U.S.C.section 360bbb-3(b)(1), unless the authorization is terminated  or revoked sooner.       Influenza A by PCR NEGATIVE NEGATIVE Final   Influenza B by PCR NEGATIVE NEGATIVE Final    Comment: (NOTE) The Xpert Xpress SARS-CoV-2/FLU/RSV plus assay is intended as an aid in the diagnosis of influenza from Nasopharyngeal swab specimens and should not be used as a sole basis for treatment.  Nasal washings and aspirates are unacceptable for Xpert Xpress SARS-CoV-2/FLU/RSV testing.  Fact Sheet for Patients: BloggerCourse.com  Fact Sheet for Healthcare Providers: SeriousBroker.it  This test is not yet approved or cleared by the [place]  FDA and has been authorized for detection and/or diagnosis of SARS-CoV-2 by FDA under an Emergency Use Authorization (EUA). This EUA will remain in effect (meaning this test can be used) for the duration of the COVID-19 declaration under Section 564(b)(1) of the Act, 21 U.S.C. section 360bbb-3(b)(1), unless the authorization is terminated or revoked.     Resp Syncytial Virus by PCR NEGATIVE NEGATIVE Final    Comment: (NOTE) Fact Sheet for Patients: BloggerCourse.com  Fact Sheet for Healthcare Providers: SeriousBroker.it  This test is not yet approved or cleared by the [place]  FDA and has been authorized for detection and/or diagnosis of SARS-CoV-2 by FDA under an Emergency Use Authorization (EUA). This EUA will remain in effect (meaning this test can be used) for the duration of the COVID-19 declaration under Section 564(b)(1) of the Act, 21 U.S.C. section 360bbb-3(b)(1), unless the authorization is terminated or revoked.  Performed at [facility], [address] [place], [place] [address]          Radiology Studies: CT THORACIC SPINE WO CONTRAST Result Date: 06/01/2024 EXAM: CT THORACIC SPINE WITHOUT CONTRAST 06/01/2024 02:02:19 PM TECHNIQUE: CT of the thoracic spine was performed without the administration of intravenous contrast. Multiplanar reformatted images are provided for review. Automated exposure control, iterative reconstruction, and/or weight based adjustment of the mA/kV was utilized to reduce the radiation dose to as low as reasonably achievable. COMPARISON: None available. CLINICAL HISTORY:  Mid-back pain, shortness of breath, and worsening lower back pain with sciatica. FINDINGS: BONES AND ALIGNMENT: Normal vertebral body heights. Fused bony demineralization with multilevel Schmorl nodes. No acute fracture or suspicious bone lesion. Normal alignment. Part of the T1 vertebral level is excluded. DEGENERATIVE CHANGES: Moderate central stenosis at T6-T7 due to a central disc protrusion as shown on image 29 series 8. SOFT TISSUES: Coronary and aortic atherosclerosis. Emphysema. Biapical pleuroparenchymal scarring. No acute abnormality. IMPRESSION: 1. 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 2. Coronary and aortic atherosclerosis. 3. Emphysema. 4. Biapical pleuroparenchymal scarring. Electronically signed by: Ryan Salvage MD 06/01/2024 06:52 PM EDT RP Workstation: [id number]   DG Chest Port 1 View Result Date: 05/31/2024 CLINICAL DATA:  10026 Shortness of breath 10026 EXAM: PORTABLE CHEST - 1 VIEW COMPARISON:  05/20/2024 FINDINGS: Emphysema. No focal airspace consolidation, pleural effusion, or pneumothorax. No cardiomegaly. Tortuous aorta with aortic atherosclerosis. No acute fracture or destructive lesions. Multilevel thoracic osteophytosis. IMPRESSION: Emphysema. No pneumonia or pulmonary edema. Electronically Signed   By: Rogelia Myers M.D.   On: 05/31/2024 20:07        Scheduled Meds:  azithromycin   250 mg Oral Daily   enoxaparin  (LOVENOX ) injection  40 mg Subcutaneous Q24H   feeding supplement  237 mL Oral BID BM   lidocaine   1 patch Transdermal Q24H   methylPREDNISolone  (SOLU-MEDROL ) injection  80 mg Intravenous Daily   Continuous Infusions:   LOS: 1 day       Anthony CHRISTELLA Pouch, MD [facility] Pager [phone]  If 7PM-7AM, please contact night-coverage [email or web address] 06/02/2024, 8:32 AM

## 2024-06-02 NOTE — Progress Notes (Signed)
 Mobility Specialist - Progress Note   Pre-mobility: HR, BP, SpO2 During mobility: HR, BP, SpO2 Post-mobility: HR, BP, SPO2     06/02/24 1507  Mobility  Activity Ambulated independently;Stood at bedside  Level of Assistance Independent  Assistive Device None  Distance Ambulated (ft) 340 ft  Range of Motion/Exercises Active  Activity Response Tolerated well  Mobility Referral Yes  Mobility visit 1 Mobility  Mobility Specialist Start Time (ACUTE ONLY) 1421  Mobility Specialist Stop Time (ACUTE ONLY) 1436  Mobility Specialist Time Calculation (min) (ACUTE ONLY) 15 min   Pt resting in bed on 2L upon entry. Pt ambulates around NS on RA Indep with RW taking x2 standing rest breaks. Pt returned to recliner and left with needs in reach.   Guido Rumble Mobility Specialist 06/02/24, 3:13 PM

## 2024-06-03 DIAGNOSIS — J441 Chronic obstructive pulmonary disease with (acute) exacerbation: Secondary | ICD-10-CM | POA: Diagnosis not present

## 2024-06-03 MED ORDER — LIDOCAINE 5 % EX PTCH: 1.0000 | MEDICATED_PATCH | CUTANEOUS | 0 refills | Status: DC

## 2024-06-03 MED ORDER — OXYCODONE-ACETAMINOPHEN 5-325 MG PO TABS: 1.0000 | ORAL_TABLET | Freq: Four times a day (QID) | ORAL | 0 refills | Status: DC | PRN

## 2024-06-03 MED ORDER — AZITHROMYCIN 250 MG PO TABS: 250.0000 mg | ORAL_TABLET | Freq: Every day | ORAL | 0 refills | Status: AC

## 2024-06-03 MED ORDER — ALBUTEROL SULFATE HFA 108 (90 BASE) MCG/ACT IN AERS: 2.0000 | INHALATION_SPRAY | Freq: Four times a day (QID) | RESPIRATORY_TRACT | 0 refills | Status: AC | PRN

## 2024-06-03 MED ORDER — PREDNISONE 10 MG PO TABS: ORAL_TABLET | ORAL | 0 refills | Status: DC

## 2024-06-03 NOTE — Plan of Care (Signed)
   Problem: Health Behavior/Discharge Planning: Goal: Ability to manage health-related needs will improve Outcome: Progressing   Problem: Clinical Measurements: Goal: Ability to maintain clinical measurements within normal limits will improve Outcome: Progressing Goal: Will remain free from infection Outcome: Progressing Goal: Diagnostic test results will improve Outcome: Progressing

## 2024-06-03 NOTE — TOC Progression Note (Addendum)
 Transition of Care [facility]) - Progression Note    Patient Details  Name: Kelli Phillips MRN: [id number] Date of Birth: August 23, 1951  Transition of Care [facility]) CM/SW Contact  [place] Zah[place], [place] Phone Number: 06/03/2024, 9:00 AM  Clinical Narrative:     Secure chat received. Patient requested affordable housing resources. Resources for [employer] have been uploaded to the AVS.   2:03pm: Secure chat received requesting a cab voucher. Per guidelines, CSW requested that patient make diligent efforts to make arrangements for transportation. CSW advised that patient has contacted friends and family but they are working and unavailable until later today. CSW inquired if patient resides on a bus line. CSW was advised that patient is unable to walk a mile from the bus stop as it is difficult for her. She can hardly breathe. Patient although it has been advised that she can hardly breathe is being discharged. Patient does not have the finances to pay for a cab. CSW inquired if patient can call her support system and ask them to pay for an [place] or [address]. There was no response and CSW was advised that nursing staff will pay for the [place] or Ly[employer].   Per chart review, She receives social security and is adept at using the bus system and local transport since she does not drive. Chart also advises that patient uses [facility] to get to medical appointments.                       Expected Discharge Plan and Services                                               Social Drivers of Health (SDOH) Interventions SDOH Screenings   Food Insecurity: No Food Insecurity (05/31/2024)  Recent Concern: Food Insecurity - Food Insecurity Present (05/31/2024)  Housing: Low Risk  (05/31/2024)  Transportation Needs: No Transportation Needs (05/31/2024)  Utilities: Not At Risk (05/31/2024)  Alcohol Screen: Low Risk  (02/18/2024)  Depression (PHQ2-9): Low Risk  (02/18/2024)  Financial  Resource Strain: Medium Risk (05/31/2024)  Physical Activity: Insufficiently Active (02/18/2024)  Social Connections: Moderately Isolated (05/31/2024)  Stress: No Stress Concern Present (02/18/2024)  Tobacco Use: Medium Risk (05/31/2024)  Health Literacy: Inadequate Health Literacy (05/31/2024)    Readmission Risk Interventions     No data to display

## 2024-06-03 NOTE — Discharge Instructions (Signed)
 HOW TO APPLY FOR [place] [employer]- ELDERLY  If you are interested in applying, you can: visit our office to pick up an application packet or call [phone] or email and provide your mailing address to have an application packet mailed to you.  Click here to apply online.  The online application is considered our pre-application and does not determine your eligibility for housing. You must complete the full application packet and schedule an interview to determine your eligibility.   To apply for housing, you must:  Be 75 years of age or disabled or handicapped or 10% can be near elderly (age 40-61 with disability) Complete a pre-application Call to schedule an appointment Bring the following items/documents with you: Valid (current) State ID on everyone 89 years of age or older Social Security Cards for all family members Certified copies of Archivist for all family members (original- no photo copies) Proof of income Air traffic controller for [employer] may be obtained at:   [employer] [address] [place], [place] [address]  *Appointments must be scheduled for a housing interview.   *Please do not mail or drop off your application.  *All members that are 47 years of age or older listed on your application must be present for the interview.  HOW TO APPLY FOR [employer] [employer] [facility] [employer]- EXCLUSIVELY ELDERLY  If you are interested in applying, you can: visit our office to pick up an application packet or call [phone] or email and provide your mailing address to have an application packet mailed to you.  Click here to apply online. The online application is considered our pre-application and does not determine your eligibility for housing. You must complete the full application packet and schedule an interview to determine your eligibility.   To apply for housing, you must:  Be 75 years of age or disabled or  handicapped Complete a pre-application Call to schedule an appointment Bring the following items/documents with you: Valid (current) State ID on everyone 78 years of age or older Social Security Cards for all family members Certified copies of Archivist for all family members (original- no photo copies) Proof of income Air traffic controller for [employer] may be obtained at:  [employer] [address] [place], [place] [address]

## 2024-06-03 NOTE — Discharge Summary (Signed)
 Physician Discharge Summary  Kelli Phillips FMW:[id number] DOB: Mar 01, 1951 DOA: 05/31/2024  PCP: Kelli Lauraine SAILOR, DO  Admit date: 05/31/2024 Discharge date: 06/03/2024  Admitted From: home  Disposition:  home  Recommendations for Outpatient Follow-up:  Follow up with PCP in 1-2 weeks F/u w/ neuro surg, Dr. Claudene, in 1-3 weeks for acute on chronic back pain   Home Health: Equipment/Devices:  Discharge Condition: stable  CODE STATUS: full  Diet recommendation:  Regular  Brief/Interim Summary: HPI was taken from Dr. Hilma: Kelli Phillips is a 73 y.o. female with medical history significant of COPD on 2 L oxygen  at night, dCHF, lower back pain with sciatica, skin cancer, GERD, who presents with SOB.   Patient states that she has progressively worsening SOB since this evening.  She has cough with greenish colored sputum production.  She has chest tightness, no active chest pain.  No fever or chills.  No recent long distance traveling.  No tenderness in the calf area.  She has history of lower back pain with sciatica, which has worsened since yesterday. No injury. Patient does not have nausea, vomiting, diarrhea or abdominal pain.  No symptoms of UTI.    Data reviewed independently and ED Course: pt was found to have BNP 17.9, negative PCR for COVID, flu and RSV, GFR> 60, troponin 3, temperature normal, blood pressure 144/73, heart rate 85, RR 26, oxygen  saturation 100% on room air.  Chest x-ray negative for infiltration.  Patient is placed in telemetry bed for observation.  Discharge Diagnoses:  Principal Problem:   COPD exacerbation (HCC) Active Problems:   Chronic diastolic CHF (congestive heart failure) (HCC)   Midline low back pain with sciatica  COPD exacerbation: continue on steroids, azithromycin , bronchodilators & encourage incentive spirometry    Chronic diastolic CHF: echo on 10/23/2022 showed EF of 55 to 60% with grade 1 diastolic dysfunction. CHF appears compensated. Monitor  I/Os. Not on any meds at home for this   Chronic back pain: etiology unclear. Percocet, flexeril  & dilaudid prn. Continue w/ lidocaine  patch. CT T spine shows mod central canal stenosis at T6-T7 due to a central disc protrusion. Continue w/ steroids. Will likely need to see neuro surg outpatient   Discharge Instructions  Discharge Instructions     Diet general   Complete by: As directed    Discharge instructions   Complete by: As directed    F/u w/ PCP in 1-2 weeks. F/u w/ neuro surg, Dr. Claudene, in 1-2 weeks for acute on chronic back pain.   Increase activity slowly   Complete by: As directed       Allergies as of 06/03/2024       Reactions   Morphine And Codeine Other (See Comments)   Penicillins Rash   Sulfa Antibiotics Rash        Medication List     STOP taking these medications    ondansetron  4 MG disintegrating tablet Commonly known as: ZOFRAN -ODT       TAKE these medications    albuterol  108 (90 Base) MCG/ACT inhaler Commonly known as: VENTOLIN  HFA Inhale 2 puffs into the lungs every 6 (six) hours as needed for wheezing or shortness of breath.   azithromycin  250 MG tablet Commonly known as: ZITHROMAX  Take 1 tablet (250 mg total) by mouth daily for 2 days. Start taking on: June 04, 2024   Bevespi  Aerosphere 9-4.8 MCG/ACT Aero Generic drug: Glycopyrrolate -Formoterol  INHALE 2 PUFFS INTO THE LUNGS TWICE A DAY   cyanocobalamin  1000 MCG  tablet Commonly known as: VITAMIN B12 Take 2 tablets (2,000 mcg total) by mouth daily.   lidocaine  5 % Commonly known as: LIDODERM  Place 1 patch onto the skin daily. Remove & Discard patch within 12 hours or as directed by MD   oxyCODONE -acetaminophen  5-325 MG tablet Commonly known as: PERCOCET/ROXICET Take 1 tablet by mouth every 6 (six) hours as needed for up to 5 days for moderate pain (pain score 4-6) or severe pain (pain score 7-10).   predniSONE  10 MG tablet Commonly known as: DELTASONE  50mg  daily x 2 days,  40mg  daily x 2 days, 30mg  daily x 2, 20mg  daily x 2 days, 10mg  daily x 2 days then stop        Follow-up Information     Kelli Lauraine SAILOR, DO Follow up.   Specialty: Family Medicine Why: Hospital follow up Contact information: [address] [place] [address] [phone]                Allergies  Allergen Reactions   Morphine And Codeine Other (See Comments)   Penicillins Rash   Sulfa Antibiotics Rash    Consultations:    Procedures/Studies: CT THORACIC SPINE WO CONTRAST Result Date: 06/01/2024 EXAM: CT THORACIC SPINE WITHOUT CONTRAST 06/01/2024 02:02:19 PM TECHNIQUE: CT of the thoracic spine was performed without the administration of intravenous contrast. Multiplanar reformatted images are provided for review. Automated exposure control, iterative reconstruction, and/or weight based adjustment of the mA/kV was utilized to reduce the radiation dose to as low as reasonably achievable. COMPARISON: None available. CLINICAL HISTORY: Mid-back pain, shortness of breath, and worsening lower back pain with sciatica. FINDINGS: BONES AND ALIGNMENT: Normal vertebral body heights. Fused bony demineralization with multilevel Schmorl nodes. No acute fracture or suspicious bone lesion. Normal alignment. Part of the T1 vertebral level is excluded. DEGENERATIVE CHANGES: Moderate central stenosis at T6-T7 due to a central disc protrusion as shown on image 29 series 8. SOFT TISSUES: Coronary and aortic atherosclerosis. Emphysema. Biapical pleuroparenchymal scarring. No acute abnormality. IMPRESSION: 1. 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 2. Coronary and aortic atherosclerosis. 3. Emphysema. 4. Biapical pleuroparenchymal scarring. Electronically signed by: Ryan Salvage MD 06/01/2024 06:52 PM EDT RP Workstation: [id number]   DG Chest Port 1 View Result Date: 05/31/2024 CLINICAL DATA:  10026 Shortness of breath 10026 EXAM: PORTABLE CHEST  - 1 VIEW COMPARISON:  05/20/2024 FINDINGS: Emphysema. No focal airspace consolidation, pleural effusion, or pneumothorax. No cardiomegaly. Tortuous aorta with aortic atherosclerosis. No acute fracture or destructive lesions. Multilevel thoracic osteophytosis. IMPRESSION: Emphysema. No pneumonia or pulmonary edema. Electronically Signed   By: Rogelia Myers M.D.   On: 05/31/2024 20:07   DG Chest 2 View Result Date: 05/12/2024 CLINICAL DATA:  Shortness of breath, vomiting EXAM: CHEST - 2 VIEW COMPARISON:  05/06/2024 FINDINGS: Atherosclerotic calcification of the aortic arch. Heart size within normal limits. Emphysema with large lung volumes and flattening of the diaphragms. No pleural effusion identified. Mild bony demineralization. IMPRESSION: 1. Emphysema with large lung volumes and flattening of the diaphragms. 2. Aortic Atherosclerosis (ICD10-I70.0) and Emphysema (ICD10-J43.9). Electronically Signed   By: Ryan Salvage M.D.   On: 05/12/2024 12:15   DG Chest Portable 1 View Result Date: 05/06/2024 CLINICAL DATA:  Shortness of breath. EXAM: PORTABLE CHEST 1 VIEW COMPARISON:  CT chest 10/19/2023, chest radiograph 09/23/2023. FINDINGS: Trachea is midline. Heart is at the upper limits of normal in size. Lungs are emphysematous. Nodular density in the right lung base, new from  10/19/2023. No pleural fluid. IMPRESSION: 1. New nodular density in the right lung base may be due to a bronchopneumonia. As malignancy cannot be excluded, Followup PA and lateral chest X-ray is recommended in 3-4 weeks following trial of antibiotic therapy to ensure resolution and exclude underlying malignancy. 2.  Emphysema (ICD10-J43.9). Electronically Signed   By: Newell Eke M.D.   On: 05/06/2024 16:20   (Echo, Carotid, EGD, Colonoscopy, ERCP)    Subjective: Pt c/o fatigue    Discharge Exam: Vitals:   06/03/24 0402 06/03/24 0724  BP: 134/71 127/70  Pulse: 67 67  Resp: 16 17  Temp: 98.1 F (36.7 C) 98 F (36.7 C)   SpO2: 94% 94%   Vitals:   06/02/24 1928 06/03/24 0402 06/03/24 0453 06/03/24 0724  BP: 123/70 134/71  127/70  Pulse: 93 67  67  Resp: 17 16  17   Temp: 97.6 F (36.4 C) 98.1 F (36.7 C)  98 F (36.7 C)  TempSrc: Oral Oral  Oral  SpO2: 93% 94%  94%  Weight:   52.6 kg   Height:        General: Pt is alert, awake, not in acute distress. Frail appearing  Cardiovascular:  S1/S2 +, no rubs, no gallops Respiratory: decreased breath sounds b/l  Abdominal: Soft, NT, ND, bowel sounds + Extremities: no edema, no cyanosis    The results of significant diagnostics from this hospitalization (including imaging, microbiology, ancillary and laboratory) are listed below for reference.     Microbiology: Recent Results (from the past 240 hours)  Resp panel by RT-PCR (RSV, Flu A&B, Covid) Anterior Nasal Swab     Status: None   Collection Time: 05/31/24  8:01 PM   Specimen: Anterior Nasal Swab  Result Value Ref Range Status   SARS Coronavirus 2 by RT PCR NEGATIVE NEGATIVE Final    Comment: (NOTE) SARS-CoV-2 target nucleic acids are NOT DETECTED.  The SARS-CoV-2 RNA is generally detectable in upper respiratory specimens during the acute phase of infection. The lowest concentration of SARS-CoV-2 viral copies this assay can detect is 138 copies/mL. A negative result does not preclude SARS-Cov-2 infection and should not be used as the sole basis for treatment or other patient management decisions. A negative result may occur with  improper specimen collection/handling, submission of specimen other than nasopharyngeal swab, presence of viral mutation(s) within the areas targeted by this assay, and inadequate number of viral copies(<138 copies/mL). A negative result must be combined with clinical observations, patient history, and epidemiological information. The expected result is Negative.  Fact Sheet for Patients:  BloggerCourse.com  Fact Sheet for Healthcare  Providers:  SeriousBroker.it  This test is no t yet approved or cleared by the [place]  FDA and  has been authorized for detection and/or diagnosis of SARS-CoV-2 by FDA under an Emergency Use Authorization (EUA). This EUA will remain  in effect (meaning this test can be used) for the duration of the COVID-19 declaration under Section 564(b)(1) of the Act, 21 U.S.C.section 360bbb-3(b)(1), unless the authorization is terminated  or revoked sooner.       Influenza A by PCR NEGATIVE NEGATIVE Final   Influenza B by PCR NEGATIVE NEGATIVE Final    Comment: (NOTE) The Xpert Xpress SARS-CoV-2/FLU/RSV plus assay is intended as an aid in the diagnosis of influenza from Nasopharyngeal swab specimens and should not be used as a sole basis for treatment. Nasal washings and aspirates are unacceptable for Xpert Xpress SARS-CoV-2/FLU/RSV testing.  Fact Sheet for Patients: BloggerCourse.com  Fact  Sheet for Healthcare Providers: SeriousBroker.it  This test is not yet approved or cleared by the [place]  FDA and has been authorized for detection and/or diagnosis of SARS-CoV-2 by FDA under an Emergency Use Authorization (EUA). This EUA will remain in effect (meaning this test can be used) for the duration of the COVID-19 declaration under Section 564(b)(1) of the Act, 21 U.S.C. section 360bbb-3(b)(1), unless the authorization is terminated or revoked.     Resp Syncytial Virus by PCR NEGATIVE NEGATIVE Final    Comment: (NOTE) Fact Sheet for Patients: BloggerCourse.com  Fact Sheet for Healthcare Providers: SeriousBroker.it  This test is not yet approved or cleared by the [place]  FDA and has been authorized for detection and/or diagnosis of SARS-CoV-2 by FDA under an Emergency Use Authorization (EUA). This EUA will remain in effect (meaning this test can be  used) for the duration of the COVID-19 declaration under Section 564(b)(1) of the Act, 21 U.S.C. section 360bbb-3(b)(1), unless the authorization is terminated or revoked.  Performed at [facility], [address] [place], [place] [address]      Labs: BNP (last 3 results) Recent Labs    05/31/24 2001  BNP 17.9   Basic Metabolic Panel: Recent Labs  Lab 05/31/24 1105 05/31/24 2001  NA 138 138  K 4.5 3.8  CL 99 100  CO2 25 27  GLUCOSE 94 105*  BUN 10 12  CREATININE 0.64 0.45  CALCIUM 9.5 9.1  MG 2.0  --    Liver Function Tests: Recent Labs  Lab 05/31/24 2001  AST 17  ALT 11  ALKPHOS 69  BILITOT 0.7  PROT 6.4*  ALBUMIN 3.6   No results for input(s): LIPASE, AMYLASE in the last 168 hours. No results for input(s): AMMONIA in the last 168 hours. CBC: Recent Labs  Lab 05/31/24 2001 06/01/24 0532  WBC 4.7 2.9*  NEUTROABS 2.5  --   HGB 14.1 12.7  HCT 42.6 38.9  MCV 89.5 89.4  PLT 203 210   Cardiac Enzymes: No results for input(s): CKTOTAL, CKMB, CKMBINDEX, TROPONINI in the last 168 hours. BNP: Invalid input(s): POCBNP CBG: No results for input(s): GLUCAP in the last 168 hours. D-Dimer No results for input(s): DDIMER in the last 72 hours. Hgb A1c No results for input(s): HGBA1C in the last 72 hours. Lipid Profile No results for input(s): CHOL, HDL, LDLCALC, TRIG, CHOLHDL, LDLDIRECT in the last 72 hours. Thyroid function studies No results for input(s): TSH, T4TOTAL, T3FREE, THYROIDAB in the last 72 hours.  Invalid input(s): FREET3 Anemia work up No results for input(s): VITAMINB12, FOLATE, FERRITIN, TIBC, IRON, RETICCTPCT in the last 72 hours. Urinalysis    Component Value Date/Time   COLORURINE YELLOW (A) 04/27/2024 1730   APPEARANCEUR CLEAR (A) 04/27/2024 1730   APPEARANCEUR Clear 09/04/2014 1811   LABSPEC 1.014 04/27/2024 1730   LABSPEC 1.019 09/04/2014 1811   PHURINE 5.0  04/27/2024 1730   GLUCOSEU NEGATIVE 04/27/2024 1730   GLUCOSEU Negative 09/04/2014 1811   HGBUR SMALL (A) 04/27/2024 1730   BILIRUBINUR NEGATIVE 04/27/2024 1730   BILIRUBINUR neg 04/13/2023 1037   BILIRUBINUR Negative 09/04/2014 1811   KETONESUR NEGATIVE 04/27/2024 1730   PROTEINUR NEGATIVE 04/27/2024 1730   UROBILINOGEN 0.2 04/13/2023 1037   NITRITE NEGATIVE 04/27/2024 1730   LEUKOCYTESUR TRACE (A) 04/27/2024 1730   LEUKOCYTESUR Negative 09/04/2014 1811   Sepsis Labs Recent Labs  Lab 05/31/24 2001 06/01/24 0532  WBC 4.7 2.9*   Microbiology Recent Results (from the past 240 hours)  Resp  panel by RT-PCR (RSV, Flu A&B, Covid) Anterior Nasal Swab     Status: None   Collection Time: 05/31/24  8:01 PM   Specimen: Anterior Nasal Swab  Result Value Ref Range Status   SARS Coronavirus 2 by RT PCR NEGATIVE NEGATIVE Final    Comment: (NOTE) SARS-CoV-2 target nucleic acids are NOT DETECTED.  The SARS-CoV-2 RNA is generally detectable in upper respiratory specimens during the acute phase of infection. The lowest concentration of SARS-CoV-2 viral copies this assay can detect is 138 copies/mL. A negative result does not preclude SARS-Cov-2 infection and should not be used as the sole basis for treatment or other patient management decisions. A negative result may occur with  improper specimen collection/handling, submission of specimen other than nasopharyngeal swab, presence of viral mutation(s) within the areas targeted by this assay, and inadequate number of viral copies(<138 copies/mL). A negative result must be combined with clinical observations, patient history, and epidemiological information. The expected result is Negative.  Fact Sheet for Patients:  BloggerCourse.com  Fact Sheet for Healthcare Providers:  SeriousBroker.it  This test is no t yet approved or cleared by the [place]  FDA and  has been authorized for  detection and/or diagnosis of SARS-CoV-2 by FDA under an Emergency Use Authorization (EUA). This EUA will remain  in effect (meaning this test can be used) for the duration of the COVID-19 declaration under Section 564(b)(1) of the Act, 21 U.S.C.section 360bbb-3(b)(1), unless the authorization is terminated  or revoked sooner.       Influenza A by PCR NEGATIVE NEGATIVE Final   Influenza B by PCR NEGATIVE NEGATIVE Final    Comment: (NOTE) The Xpert Xpress SARS-CoV-2/FLU/RSV plus assay is intended as an aid in the diagnosis of influenza from Nasopharyngeal swab specimens and should not be used as a sole basis for treatment. Nasal washings and aspirates are unacceptable for Xpert Xpress SARS-CoV-2/FLU/RSV testing.  Fact Sheet for Patients: BloggerCourse.com  Fact Sheet for Healthcare Providers: SeriousBroker.it  This test is not yet approved or cleared by the [place]  FDA and has been authorized for detection and/or diagnosis of SARS-CoV-2 by FDA under an Emergency Use Authorization (EUA). This EUA will remain in effect (meaning this test can be used) for the duration of the COVID-19 declaration under Section 564(b)(1) of the Act, 21 U.S.C. section 360bbb-3(b)(1), unless the authorization is terminated or revoked.     Resp Syncytial Virus by PCR NEGATIVE NEGATIVE Final    Comment: (NOTE) Fact Sheet for Patients: BloggerCourse.com  Fact Sheet for Healthcare Providers: SeriousBroker.it  This test is not yet approved or cleared by the [place]  FDA and has been authorized for detection and/or diagnosis of SARS-CoV-2 by FDA under an Emergency Use Authorization (EUA). This EUA will remain in effect (meaning this test can be used) for the duration of the COVID-19 declaration under Section 564(b)(1) of the Act, 21 U.S.C. section 360bbb-3(b)(1), unless the authorization is  terminated or revoked.  Performed at [facility], [address] [place], [place] [address]      Time coordinating discharge: 38 minutes  SIGNED:   Anthony CHRISTELLA Pouch, MD  [facility] 06/03/2024, 1:23 PM Pager   If 7PM-7AM, please contact night-coverage [email or web address]

## 2024-06-05 ENCOUNTER — Telehealth: Payer: Self-pay

## 2024-06-05 NOTE — Patient Instructions (Addendum)
 Visit Information  Thank you for taking time to visit with me today. Please don't hesitate to contact me if I can be of assistance to you before our next scheduled telephone appointment.  Our next appointment is by telephone on 06/13/24 at 11:00 AM  Following is a copy of your care plan:   Goals Addressed             This Visit's Progress    VBCI Transitions of Care (TOC) Care Plan       Problems:  Recent Hospitalization for treatment of COPD; On admission pt was found to have BNP 17.9, negative PCR for COVID, flu and RSV, GFR> 60, troponin 3, temperature normal, blood pressure 144/73, heart rate 85, RR 26, oxygen  saturation 100% on room air.  Discharge Diagnoses:  Principal Problem:   COPD exacerbation (HCC) Active Problems:   Chronic diastolic CHF (congestive heart failure) (HCC)   Midline low back pain with sciatica  Goal:  Over the next 30 days, the patient will not experience hospital readmission  Interventions:   COPD Interventions: Advised patient to engage in light exercise as tolerated 3-5 days a week to aid in the the management of COPD Advised patient to track and manage COPD triggers Advised patient to self assesses COPD action plan zone and make appointment with provider if in the yellow zone for 48 hours without improvement Discussed the importance of adequate rest and management of fatigue with COPD Provided instruction about proper use of medications used for management of COPD including inhalers Use of home oxygen  Reviewed DC instructions  Patient Self Care Activities:  Attend all scheduled provider appointments Attend church or other social activities Call pharmacy for medication refills 3-7 days in advance of running out of medications Call provider office for new concerns or questions  Notify RN Care Manager of TOC call rescheduling needs Participate in Transition of Care Program/Attend TOC scheduled calls Perform all self care activities independently   Take medications as prescribed    Plan:  Telephone follow up appointment with care management team member scheduled for:    The care management team will reach out to the patient again over the next 5-7 business  days.        Patient verbalizes understanding of instructions and care plan provided today and agrees to view in MyChart. Active MyChart status and patient understanding of how to access instructions and care plan via MyChart confirmed with patient.     The patient has been provided with contact information for the care management team and has been advised to call with any health related questions or concerns.   Please call the care guide team at [phone] if you need to cancel or reschedule your appointment.   Please call the Suicide and Crisis Lifeline: 988 call the [place]  National Suicide Prevention Lifeline: 289 795 9707 or TTY: 503-544-6209 TTY 8025814366) to talk to a trained counselor call 1-800-273-TALK (toll free, 24 hour hotline) call 911 if you are experiencing a Mental Health or Behavioral Health Crisis or need someone to talk to.  Richerd Fish, RN, BSN, CCM [facility], [facility] Health RN Care Manager Direct Dial: [phone]

## 2024-06-05 NOTE — Transitions of Care (Post Inpatient/ED Visit) (Signed)
 06/05/2024  Name: Kelli Phillips MRN: [id number] DOB: 15-Oct-1950  Today's TOC FU Call Status: Today's TOC FU Call Status:: Successful TOC FU Call Completed TOC FU Call Complete Date: 06/05/24 Patient's Name and Date of Birth confirmed.  Transition Care Management Follow-up Telephone Call Date of Discharge: 06/03/24 Discharge Facility: [facility]) Type of Discharge: Inpatient Admission Primary Inpatient Discharge Diagnosis:: Chronic Pulm How have you been since you were released from the hospital?: Better Any questions or concerns?: No  Items Reviewed: Did you receive and understand the discharge instructions provided?: Yes Medications obtained,verified, and reconciled?: Yes (Medications Reviewed) Any new allergies since your discharge?: No (Allergy add about egg sensitivity) Dietary orders reviewed?: Yes Type of Diet Ordered:: low sodium Do you have support at home?: Yes People in Home [RPT]: child(ren), dependent  Medications Reviewed Today: Medications Reviewed Today     Reviewed by Eilleen Richerd GRADE, RN (Registered Nurse) on 06/05/24 at [phone]  Med List Status: <None>   Medication Order Taking? Sig Documenting Provider Last Dose Status Informant  albuterol  (VENTOLIN  HFA) 108 (90 Base) MCG/ACT inhaler [id number] Yes Inhale 2 puffs into the lungs every 6 (six) hours as needed for wheezing or shortness of breath. Trudy Anthony HERO, MD  Active   azithromycin  (ZITHROMAX ) 250 MG tablet [id number] Yes Take 1 tablet (250 mg total) by mouth daily for 2 days. Trudy Anthony HERO, MD  Active   cyanocobalamin  (VITAMIN B12) 1000 MCG tablet [id number] Yes Take 2 tablets (2,000 mcg total) by mouth daily. Hope Merle, MD  Active Self  Glycopyrrolate -Formoterol  (BEVESPI  AEROSPHERE) 9-4.8 MCG/ACT TERESE [id number] Yes INHALE 2 PUFFS INTO THE LUNGS TWICE A MICHAE Isadora Hose, MD  Active Self  lidocaine  (LIDODERM ) 5 % [id number]  Place 1 patch onto the skin daily. Remove &  Discard patch within 12 hours or as directed by MD Trudy Anthony HERO, MD  Active            Med Note LESLY, RICHERD GRADE Kitchens Jun 05, 2024  9:21 AM) Insurance would not cover this dose she said would cost her $300oxyCODONE -acetaminophen  (PERCOCET/ROXICET) 5-325 MG tablet [id number]  Take 1 tablet by mouth every 6 (six) hours as needed for up to 5 days for moderate pain (pain score 4-6) or severe pain (pain score 7-10). Trudy Anthony HERO, MD  Active            Med Note LESLY, RICHERD GRADE Kitchens Jun 05, 2024  9:22 AM) Needs to pick it up but ready at pharmacy  predniSONE  (DELTASONE ) 10 MG tablet [id number] Yes 50mg  daily x 2 days, 40mg  daily x 2 days, 30mg  daily x 2, 20mg  daily x 2 days, 10mg  daily x 2 days then stop Trudy Anthony HERO, MD  Active             Home Care and Equipment/Supplies: Were Home Health Services Ordered?: No Any new equipment or medical supplies ordered?: No  Functional Questionnaire: Do you need assistance with bathing/showering or dressing?: No Do you need assistance with meal preparation?: No Do you need assistance with eating?: No Do you have difficulty maintaining continence: No Do you need assistance with getting out of bed/getting out of a chair/moving?: No Do you have difficulty managing or taking your medications?: No  Follow up appointments reviewed: PCP Follow-up appointment confirmed?: Yes Date of PCP follow-up appointment?: 06/12/24 Follow-up Provider: Lauraine Buoy, DO  SDOH Interventions Today    Flowsheet Row Most Recent Value  SDOH Interventions   Food Insecurity Interventions Intervention Not Indicated  Housing Interventions Intervention Not Indicated  Transportation Interventions Intervention Not Indicated  Utilities Interventions Intervention Not Indicated    Goals Addressed             This Visit's Progress    [facility] Transitions of Care (TOC) Care Plan       Problems:  Recent Hospitalization for treatment of COPD; On  admission pt was found to have BNP 17.9, negative PCR for COVID, flu and RSV, GFR> 60, troponin 3, temperature normal, blood pressure 144/73, heart rate 85, RR 26, oxygen  saturation 100% on room air.  Discharge Diagnoses:  Principal Problem:   COPD exacerbation (HCC) Active Problems:   Chronic diastolic CHF (congestive heart failure) (HCC)   Midline low back pain with sciatica  Goal:  Over the next 30 days, the patient will not experience hospital readmission  Interventions:   COPD Interventions: Advised patient to engage in light exercise as tolerated 3-5 days a week to aid in the the management of COPD Advised patient to track and manage COPD triggers Advised patient to self assesses COPD action plan zone and make appointment with provider if in the yellow zone for 48 hours without improvement Discussed the importance of adequate rest and management of fatigue with COPD Provided instruction about proper use of medications used for management of COPD including inhalers Use of home oxygen  Reviewed DC instructions  Patient Self Care Activities:  Attend all scheduled provider appointments Attend church or other social activities Call pharmacy for medication refills 3-7 days in advance of running out of medications Call provider office for new concerns or questions  Notify RN Care Manager of TOC call rescheduling needs Participate in Transition of Care Program/Attend TOC scheduled calls Perform all self care activities independently  Take medications as prescribed    Plan:  Telephone follow up appointment with care management team member scheduled for:    The care management team will reach out to the patient again over the next 5-7 business  days.          Richerd Fish, RN, BSN, CCM [facility], [facility] RN Care Manager Direct Dial: [phone]

## 2024-06-06 ENCOUNTER — Telehealth: Payer: Self-pay

## 2024-06-06 DIAGNOSIS — J449 Chronic obstructive pulmonary disease, unspecified: Secondary | ICD-10-CM

## 2024-06-06 DIAGNOSIS — J441 Chronic obstructive pulmonary disease with (acute) exacerbation: Secondary | ICD-10-CM

## 2024-06-06 DIAGNOSIS — R0602 Shortness of breath: Secondary | ICD-10-CM
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
## 2024-06-07 ENCOUNTER — Emergency Department (EMERGENCY_DEPARTMENT_HOSPITAL)
Admission: EM | Admit: 2024-06-07 | Discharge: 2024-06-08 | Disposition: A | Discharge status: Other Acute Inpatient Hospital | Source: Home / Self Care | Attending: Emergency Medicine | Admitting: Emergency Medicine

## 2024-06-07 DIAGNOSIS — Z85828 Personal history of other malignant neoplasm of skin: Secondary | ICD-10-CM | POA: Insufficient documentation

## 2024-06-07 DIAGNOSIS — F22 Delusional disorders: Secondary | ICD-10-CM | POA: Insufficient documentation

## 2024-06-07 DIAGNOSIS — F19959 Other psychoactive substance use, unspecified with psychoactive substance-induced psychotic disorder, unspecified: Secondary | ICD-10-CM | POA: Diagnosis not present

## 2024-06-07 DIAGNOSIS — J449 Chronic obstructive pulmonary disease, unspecified: Secondary | ICD-10-CM | POA: Insufficient documentation

## 2024-06-07 DIAGNOSIS — Z87891 Personal history of nicotine dependence: Secondary | ICD-10-CM | POA: Insufficient documentation

## 2024-06-07 DIAGNOSIS — F419 Anxiety disorder, unspecified: Secondary | ICD-10-CM | POA: Insufficient documentation

## 2024-06-07 DIAGNOSIS — F29 Unspecified psychosis not due to a substance or known physiological condition: Secondary | ICD-10-CM | POA: Insufficient documentation

## 2024-06-07 DIAGNOSIS — F1994 Other psychoactive substance use, unspecified with psychoactive substance-induced mood disorder: Secondary | ICD-10-CM | POA: Diagnosis not present

## 2024-06-07 LAB — COMPREHENSIVE METABOLIC PANEL WITH GFR
ALT: 17 U/L (ref 0–44)
AST: 18 U/L (ref 15–41)
Albumin: 3.6 g/dL (ref 3.5–5.0)
Alkaline Phosphatase: 69 U/L (ref 38–126)
Anion gap: 10 (ref 5–15)
BUN: 8 mg/dL (ref 8–23)
CO2: 27 mmol/L (ref 22–32)
Calcium: 8.9 mg/dL (ref 8.9–10.3)
Chloride: 100 mmol/L (ref 98–111)
Creatinine, Ser: 0.36 mg/dL — ABNORMAL LOW (ref 0.44–1.00)
GFR, Estimated: >60 mL/min (ref >60)
Glucose, Bld: 121 mg/dL — ABNORMAL HIGH (ref 70–99)
Potassium: 3.9 mmol/L (ref 3.5–5.1)
Sodium: 137 mmol/L (ref 135–145)
Total Bilirubin: 0.8 mg/dL (ref 0.0–1.2)
Total Protein: 6 g/dL — ABNORMAL LOW (ref 6.5–8.1)

## 2024-06-07 LAB — CBC WITH DIFFERENTIAL/PLATELET
Abs Immature Granulocytes: 0.1 K/uL — ABNORMAL HIGH (ref 0.00–0.07)
Basophils Absolute: 0 K/uL (ref 0.0–0.1)
Basophils Relative: 1 %
Eosinophils Absolute: 0.1 K/uL (ref 0.0–0.5)
Eosinophils Relative: 1 %
HCT: 42.6 % (ref 36.0–46.0)
Hemoglobin: 13.9 g/dL (ref 12.0–15.0)
Immature Granulocytes: 1 %
Lymphocytes Relative: 12 %
Lymphs Abs: 0.9 K/uL (ref 0.7–4.0)
MCH: 29.3 pg (ref 26.0–34.0)
MCHC: 32.6 g/dL (ref 30.0–36.0)
MCV: 89.7 fL (ref 80.0–100.0)
Monocytes Absolute: 0.2 K/uL (ref 0.1–1.0)
Monocytes Relative: 3 %
Neutro Abs: 6.3 K/uL (ref 1.7–7.7)
Neutrophils Relative %: 82 %
Platelets: 272 K/uL (ref 150–400)
RBC: 4.75 MIL/uL (ref 3.87–5.11)
RDW: 12.5 % (ref 11.5–15.5)
WBC: 7.6 K/uL (ref 4.0–10.5)
nRBC: 0 % (ref 0.0–0.2)

## 2024-06-07 LAB — ETHANOL: Alcohol, Ethyl (B): <15 mg/dL (ref <15)
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
## 2024-06-07 NOTE — BH Assessment (Signed)
 This Clinical research associate contacted [facility] to request an assessment, request has been made, assessment is currently pending
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
## 2024-06-07 NOTE — ED Notes (Signed)
 EDP at bedside

## 2024-06-07 NOTE — ED Notes (Signed)
 TTS at bedside.

## 2024-06-08 ENCOUNTER — Other Ambulatory Visit: Payer: Self-pay

## 2024-06-08 ENCOUNTER — Encounter: Payer: Self-pay | Admitting: Psychiatry

## 2024-06-08 ENCOUNTER — Inpatient Hospital Stay
Admission: AD | Admit: 2024-06-08 | Discharge: 2024-06-16 | DRG: 897 | Disposition: A | Discharge status: Home / Self Care | Source: Intra-hospital | Attending: Psychiatry | Admitting: Psychiatry

## 2024-06-08 DIAGNOSIS — Z8249 Family history of ischemic heart disease and other diseases of the circulatory system: Secondary | ICD-10-CM

## 2024-06-08 DIAGNOSIS — Z801 Family history of malignant neoplasm of trachea, bronchus and lung: Secondary | ICD-10-CM | POA: Diagnosis not present

## 2024-06-08 DIAGNOSIS — Z811 Family history of alcohol abuse and dependence: Secondary | ICD-10-CM

## 2024-06-08 DIAGNOSIS — Z59868 Other specified financial insecurity: Secondary | ICD-10-CM | POA: Diagnosis not present

## 2024-06-08 DIAGNOSIS — Z880 Allergy status to penicillin: Secondary | ICD-10-CM

## 2024-06-08 DIAGNOSIS — M1990 Unspecified osteoarthritis, unspecified site: Secondary | ICD-10-CM | POA: Diagnosis present

## 2024-06-08 DIAGNOSIS — Z8701 Personal history of pneumonia (recurrent): Secondary | ICD-10-CM | POA: Diagnosis not present

## 2024-06-08 DIAGNOSIS — I5032 Chronic diastolic (congestive) heart failure: Secondary | ICD-10-CM | POA: Diagnosis present

## 2024-06-08 DIAGNOSIS — F0390 Unspecified dementia without behavioral disturbance: Secondary | ICD-10-CM | POA: Diagnosis not present

## 2024-06-08 DIAGNOSIS — F19959 Other psychoactive substance use, unspecified with psychoactive substance-induced psychotic disorder, unspecified: Secondary | ICD-10-CM

## 2024-06-08 DIAGNOSIS — Z825 Family history of asthma and other chronic lower respiratory diseases: Secondary | ICD-10-CM

## 2024-06-08 DIAGNOSIS — F1994 Other psychoactive substance use, unspecified with psychoactive substance-induced mood disorder: Principal | ICD-10-CM | POA: Diagnosis present

## 2024-06-08 DIAGNOSIS — Z87891 Personal history of nicotine dependence: Secondary | ICD-10-CM | POA: Diagnosis not present

## 2024-06-08 DIAGNOSIS — J439 Emphysema, unspecified: Secondary | ICD-10-CM | POA: Diagnosis present

## 2024-06-08 DIAGNOSIS — Z5941 Food insecurity: Secondary | ICD-10-CM

## 2024-06-08 DIAGNOSIS — Z85828 Personal history of other malignant neoplasm of skin: Secondary | ICD-10-CM | POA: Diagnosis not present

## 2024-06-08 DIAGNOSIS — Z800 Family history of malignant neoplasm of digestive organs: Secondary | ICD-10-CM | POA: Diagnosis not present

## 2024-06-08 DIAGNOSIS — G4700 Insomnia, unspecified: Secondary | ICD-10-CM | POA: Diagnosis present

## 2024-06-08 DIAGNOSIS — Z803 Family history of malignant neoplasm of breast: Secondary | ICD-10-CM | POA: Diagnosis not present

## 2024-06-08 DIAGNOSIS — Z860100 Personal history of colon polyps, unspecified: Secondary | ICD-10-CM | POA: Diagnosis not present

## 2024-06-08 DIAGNOSIS — F22 Delusional disorders: Secondary | ICD-10-CM | POA: Diagnosis not present

## 2024-06-08 DIAGNOSIS — Z882 Allergy status to sulfonamides status: Secondary | ICD-10-CM

## 2024-06-08 LAB — RESP PANEL BY RT-PCR (RSV, FLU A&B, COVID)  RVPGX2
Influenza A by PCR: NEGATIVE
Influenza B by PCR: NEGATIVE
Resp Syncytial Virus by PCR: NEGATIVE
SARS Coronavirus 2 by RT PCR: NEGATIVE

## 2024-06-08 LAB — URINE DRUG SCREEN, QUALITATIVE (ARMC ONLY)
Amphetamines, Ur Screen: NOT DETECTED
Barbiturates, Ur Screen: NOT DETECTED
Benzodiazepine, Ur Scrn: NOT DETECTED
Cannabinoid 50 Ng, Ur ~~LOC~~: NOT DETECTED
Cocaine Metabolite,Ur ~~LOC~~: NOT DETECTED
MDMA (Ecstasy)Ur Screen: NOT DETECTED
Methadone Scn, Ur: NOT DETECTED
Opiate, Ur Screen: NOT DETECTED
Phencyclidine (PCP) Ur S: NOT DETECTED
Tricyclic, Ur Screen: NOT DETECTED

## 2024-06-08 MED ORDER — ENSURE PLUS HIGH PROTEIN PO LIQD
237.0000 mL | Freq: Two times a day (BID) | ORAL | Status: DC
  Administered 2024-06-09: 237 mL via ORAL

## 2024-06-08 MED ORDER — OLANZAPINE 10 MG IM SOLR: 5.0000 mg | Freq: Three times a day (TID) | INTRAMUSCULAR | Status: DC | PRN

## 2024-06-08 MED ORDER — ALUM & MAG HYDROXIDE-SIMETH 200-200-20 MG/5ML PO SUSP: 30.0000 mL | ORAL | Status: DC | PRN

## 2024-06-08 MED ORDER — ALBUTEROL SULFATE HFA 108 (90 BASE) MCG/ACT IN AERS
2.0000 | INHALATION_SPRAY | Freq: Four times a day (QID) | RESPIRATORY_TRACT | Status: DC | PRN
  Administered 2024-06-08 – 2024-06-16 (×11): 2 via RESPIRATORY_TRACT
  Filled 2024-06-08: qty 6.7

## 2024-06-08 MED ORDER — ACETAMINOPHEN 325 MG PO TABS
650.0000 mg | ORAL_TABLET | Freq: Four times a day (QID) | ORAL | Status: DC | PRN
  Administered 2024-06-08 – 2024-06-15 (×5): 650 mg via ORAL
  Filled 2024-06-08 (×5): qty 2

## 2024-06-08 MED ORDER — ARIPIPRAZOLE 2 MG PO TABS: 2.0000 mg | ORAL_TABLET | Freq: Every day | ORAL | Status: DC

## 2024-06-08 MED ORDER — ALBUTEROL SULFATE (2.5 MG/3ML) 0.083% IN NEBU: 2.5000 mg | INHALATION_SOLUTION | Freq: Four times a day (QID) | RESPIRATORY_TRACT | Status: DC | PRN

## 2024-06-08 MED ORDER — OLANZAPINE 5 MG PO TBDP
5.0000 mg | ORAL_TABLET | Freq: Three times a day (TID) | ORAL | Status: DC | PRN
  Administered 2024-06-08: 5 mg via ORAL
  Filled 2024-06-08: qty 1

## 2024-06-08 MED ORDER — ARFORMOTEROL TARTRATE 15 MCG/2ML IN NEBU
15.0000 ug | INHALATION_SOLUTION | Freq: Two times a day (BID) | RESPIRATORY_TRACT | Status: DC
  Filled 2024-06-08 (×2): qty 2

## 2024-06-08 MED ORDER — ALBUTEROL SULFATE HFA 108 (90 BASE) MCG/ACT IN AERS
2.0000 | INHALATION_SPRAY | Freq: Four times a day (QID) | RESPIRATORY_TRACT | Status: DC | PRN
  Administered 2024-06-08: 1 via RESPIRATORY_TRACT
  Filled 2024-06-08: qty 6.7

## 2024-06-08 MED ORDER — ALBUTEROL SULFATE HFA 108 (90 BASE) MCG/ACT IN AERS: 2.0000 | INHALATION_SPRAY | Freq: Four times a day (QID) | RESPIRATORY_TRACT | Status: DC | PRN

## 2024-06-08 MED ORDER — UMECLIDINIUM BROMIDE 62.5 MCG/ACT IN AEPB
1.0000 | INHALATION_SPRAY | Freq: Every day | RESPIRATORY_TRACT | Status: DC
  Filled 2024-06-08: qty 7

## 2024-06-08 MED ORDER — MAGNESIUM HYDROXIDE 400 MG/5ML PO SUSP: 30.0000 mL | Freq: Every day | ORAL | Status: DC | PRN

## 2024-06-08 NOTE — Tx Team (Signed)
 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                   DISCHARGE CRITERIA:  Ability to meet basic life and health needs Adequate post-discharge living arrangements Improved stabilization in mood, thinking, and/or behavior Safe-care adequate arrangements made  PRELIMINARY DISCHARGE PLAN: Return to previous living arrangement  PATIENT/FAMILY INVOLVEMENT: This treatment plan has been presented to and reviewed with the patient, Kelli Phillips. The patient has been given the opportunity to questions and make suggestions.  Garen [name], RN 06/08/2024, 4:43 PM

## 2024-06-08 NOTE — ED Notes (Signed)
 Patient is resting comfortably.

## 2024-06-08 NOTE — ED Notes (Signed)
 Patient sleeping

## 2024-06-08 NOTE — ED Notes (Signed)
 Pt to interview room with tts for telepsych interview.

## 2024-06-08 NOTE — ED Notes (Signed)
Pt ambulatory to restroom with steady gait. Urine collected and sent to lab

## 2024-06-08 NOTE — ED Notes (Addendum)
 Phone given to pt to call family member; no answer

## 2024-06-08 NOTE — ED Provider Notes (Signed)
 Emergency Medicine Observation Re-evaluation Note  Kelli Phillips is a 73 y.o. female, who presented to the ED for complaints of Panic Attack Currently awaiting psych dispo.  Physical Exam  BP (!) 156/83   Pulse 78   Temp 97.7 F (36.5 C) (Oral)   Resp 18   Ht 5' 5 (1.651 m)   Wt 49 kg   SpO2 94%   BMI 17.97 kg/m  Physical Exam General: resting calmly   ED Course / MDM  EKG:   I have reviewed the labs performed to date as well as medications administered while in observation.    Plan  Current plan is for psychiatric admission.    Floy Roberts, MD 06/08/24 [phone]

## 2024-06-08 NOTE — Group Note (Signed)
 Recreation Therapy Group Note   Group Topic:Animal Assisted Therapy   Group Date: 06/08/2024 Start Time: 1435 End Time: 1505 Facilitators: Celestia Jeoffrey BRAVO, LRT, CTRS Location: Dayroom  Group Description: AAA. Animal-Assisted Activity provides opportunities for motivational, educational, therapeutic and/or recreational benefits to enhance quality of life. Selinda and Rollo visited the unit to interact with patients.   Goal Areas Addressed:  Reduced anxiety and stress Improved mood Increased social interaction Enhanced communication skills Reduced loneliness and isolation Improved emotional regulation    Affect/Mood: N/A   Participation Level: Did not attend    Clinical Observations/Individualized Feedback: Patient was not on the unit at the time of group.   Plan: Continue to engage patient in RT group sessions 2-3x/week.   Jeoffrey BRAVO Celestia, LRT, CTRS 06/08/2024 4:48 PM

## 2024-06-08 NOTE — ED Notes (Signed)
 Meal provided
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
## 2024-06-08 NOTE — ED Notes (Signed)
Patient refused shower. 

## 2024-06-08 NOTE — ED Notes (Signed)
 Pt given phone to return her son's call.

## 2024-06-08 NOTE — Consult Note (Addendum)
 [facility] Consult Note  Patient Name: Kelli Phillips MRN: [id number] DOB: [birth year] DATE OF Consult: 06/08/2024  PRIMARY PSYCHIATRIC DIAGNOSES Medication induced psychotic disorder; Rule out neurocognitive disorder with behavioral disturbance; Rule out delirium due to general medical condition  Based on my current evaluation and assessment of the patient, she is a 73 y.o. with documented progression in the demonstrated symptoms of psychosis from concerns of being financially exploited (yesterday) to complex paranoid delusions of being systematically hunted by neighbor to the death. Patient unable to engage in reality testing. This time and in 03/2024 (when patient had similar paranoid delusions), she has been taking a prednisone  taper. Patient denies suicidal and homicidal intent at this time. However, given patient has been having paranoid delusions at an increasing frequency and intensity as she is taking her prednisone  taper, and that patient's QTc is far too prolonged to start an antipsychotic other than aripiprazole, there is concern that she will continue to be unable to care for self in the community given continued progression of her psychotic symptoms. The patient's presentation is consistent with Medication induced psychotic disorder; Rule out neurocognitive disorder with behavioral disturbance; Rule out delirium due to general medical condition. Therefore, patient does meet criteria for an intensive inpatient psychiatric hospitalization.  RECOMMENDATIONS  Inpatient psychiatric admission recommended?   YES, patient is at high risk to self at this time. Requires involuntary admission if patient does not agree to voluntary psychiatric admission once patient is medically cleared.   Medication recommendations:  Risks, benefits, side effects and alternatives to treatments reviewed:  -Continue home regimen (recommend performing a medication reconciliation with patient's pharmacy to  ascertain accuracy of reported regimen).  -Given highly prolonged QTc, would recommend against starting any antipsychotic medication other than aripiprazole, which demonstrates reduction of QTc per case reports and literature review. As such, would start aripiprazole 2 mg at bedtime for mood stabilization and psychosis. Please note, antipsychotics increase the risk of falls and all-cause mortality in the elderly; moreover, this medication may cause sedation when administered during the day further worsening patient's confusion/participation in cares. As patient's mentation clears, would recommend performing a wean off of this medication given the side effect burden. Side effects include: Dizziness, lightheadedness, drowsiness, nausea, vomiting, tiredness, excess saliva/drooling, blurred vision, weight gain, constipation, headache, restlessness (especially in the legs), shaking (tremor), muscle spasm, mask-like expression of the face, trouble controlling certain urges (such as gambling, sex, eating or shopping), unusual uncontrolled movements called tardive dyskinesia (these uncontrolled movements are often of the face, mouth, tongue, arms, or legs),  and trouble sleeping may occur. Note the following serious side effects:  fainting, suicidal thoughts, trouble swallowing, and seizures.  As needed medications to manage patient's acute symptoms while in hospital care: QTc is 618 ms as of 05/2024  -Maximize utilization of verbal de-escalation techniques, if attempts are unsuccessful and patient poses a threat to self and others: Consider lorazepam  0.5 mg to 1 mg PO/IM every 6 hours as needed for severe agitation. Would offer patient the option of taking PO medication first, but if patient refuses then may administer IM medication as a last resort.   Non-Medication recommendations:  -Note: Please stop all antipsychotic and QTc prolonging medications if patient's QTc is greater than 480 ms. Of note, to decrease the  risk of prolonged QTc, please maintain potassium and magnesium levels within normal ranges. -Agree with work up for organic causes of altered mentation and mood dysregulation, consider the following if not already performed and clinically appropriate: CT  of the head, CBC and differential, basic metabolic profile, liver function tests (if abnormal consider ammonia level), urinalysis, urine toxicology screen, vitamin B12 level, vitamin D  level, TSH with reflex free T4  Observation recommendations:  per unit protocol for monitoring psychotic patient   Treatment team members, and family members if applicable, with whom risk formulation and management, and other related findings, were reviewed include the following: primary team    Thank you for involving us  in the care of this patient. If you have any additional questions or concerns, please call [phone] and ask for me or the provider on-call.  Total time spent in this encounter was 60 minutes with greater than 50% of time spent in counseling and coordination of care.    TELEPSYCHIATRY ATTESTATION & CONSENT  As the provider for this telehealth consult, I attest that I verified the patient's identity using two separate identifiers, introduced myself to the patient, provided my credentials, disclosed my location, and performed this encounter via a HIPAA-compliant, real-time, face-to-face, two-way, interactive audio and video platform and with the full consent and agreement of the patient (or guardian as applicable.)  Patient physical location: [facility] at [facility] provider physical location: home office in state of [place].  Video start time: 2310 (Central Time) Video end time: 2330 (Central Time)  IDENTIFYING DATA  Kelli Phillips is a 73 y.o. year-old female for whom a psychiatric consultation has been ordered by the primary provider. The patient was identified using two separate identifiers.  CHIEF  COMPLAINT/REASON FOR CONSULT  Behavioral health concern  HISTORY OF PRESENT ILLNESS (HPI)  I evaluated the patient today face-to-face via secure, HIPAA-compliant telepsychiatric connection, and at the request of the primary treatment team. The reason for the telepsychiatric consultation is that the patient is a 73 year old female with a documented history of COPD, chronic diastolic CHF, and colon polyp who presents for psychiatric evaluation given complex paranoid delusions of being at risk of death by neighbor who is threatening patient and son. Moreover, there is documentation of paranoid perseverations beginning the day prior to this ED presentation about being compromised financially. Patient recently hospitalized for management of COPD exacerbation and discharged with on prednisone  taper on 06/03/2024. Scrutinizing the medical chart further, patient had paranoid delusions of a trespasser on her property in 03/2024 in the context of taking a short course of prednisone  20 md daily. Primary team is seeking psychotropic medication recommendations, safety evaluation to determine appropriateness for more intensive psychiatric services and diagnostic clarity as to the patient's presentation.   During one-on-one evaluation with this provider, patient was alert and oriented to self and generally to location, and situation. The patient did not appear to be overtly inappropriately internally preoccupied; patient's thought process was perseverative on paranoid themes of being at risk of death at the hand of neighbor. Patient was unable to engage in reality testing; however, she elaborated that because she feels that she is danger she has been experiencing increased anxiety. Patient denies suicidal and homicidal intent and is future oriented to return home once she feels safe.   Patient's son's contact information was not found in the chart, and so unable to call him to obtain collateral. It is documented that son has  mental health concerns.    PAST PSYCHIATRIC HISTORY  Inpatient psychiatric treatment: per chart documentation, denies  Outpatient mental health treatment: per chart documentation, patient established with primary provider  Suicide attempts: per chart documentation, denies Trauma history: patient  did not assert further current concern for trauma or exploitation beyond described in the HPI   Otherwise as per HPI above.  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   Glycopyrrolate -Formoterol  (BEVESPI  AEROSPHERE) 9-4.8 MCG/ACT AERO INHALE 2 PUFFS INTO THE LUNGS TWICE A DAY   albuterol  (VENTOLIN  HFA) 108 (90 Base) MCG/ACT inhaler Inhale 2 puffs into the lungs every 6 (six) hours as needed for wheezing or shortness of breath.   oxyCODONE -acetaminophen  (PERCOCET/ROXICET) 5-325 MG tablet Take 1 tablet by mouth every 6 (six) hours as needed for up to 5 days for moderate pain (pain score 4-6) or severe pain (pain score 7-10).   predniSONE  (DELTASONE ) 10 MG tablet 50mg  daily x 2 days, 40mg  daily x 2 days, 30mg  daily x 2, 20mg  daily x 2 days, 10mg  daily x 2 days then stop   lidocaine  (LIDODERM ) 5 % Place 1 patch onto the skin daily. Remove & Discard patch within 12 hours or as directed by MD (Patient not taking: Reported on 06/07/2024)     ALLERGIES  Allergies  Allergen Reactions   Egg-Derived Products Diarrhea and Other (See Comments)   Morphine And Codeine Other (See Comments)   Penicillins Rash   Sulfa Antibiotics Rash    SOCIAL & SUBSTANCE USE HISTORY  Social History    Socioeconomic History   Marital status: Divorced    Spouse name: Not on file   Number of children: Not on file   Years of education: Not on file   Highest education level: Not on file  Occupational History   Not on file  Tobacco Use   Smoking status: Former    Current packs/day: 0.00    Average packs/day: 1 pack/day for 43.0 years (43.0 ttl pk-yrs)    Types: Cigarettes    Start date: 16    Quit date: 2009    Years since quitting: 16.7   Smokeless tobacco: Never  Vaping Use   Vaping status: Never Used  Substance and Sexual Activity   Alcohol use: No   Drug use: No   Sexual activity: Yes    Birth control/protection: Post-menopausal  Other Topics Concern   Not on file  Social History Narrative   Not on file   Social Drivers of Health   Financial Resource Strain: Medium Risk (05/31/2024)   Overall Financial Resource Strain (CARDIA)    Difficulty of Paying Living Expenses: Somewhat hard  Food Insecurity: No Food Insecurity (06/05/2024)   Hunger Vital Sign    Worried About Running Out of Food in the Last Year: Never true    Ran Out of Food in the Last Year: Never true  Recent Concern: Food Insecurity - Food Insecurity Present (05/31/2024)   Hunger Vital Sign    Worried About Running Out of Food in the Last Year: Sometimes true    Ran Out of Food in the Last Year: Never true  Transportation Needs: No Transportation Needs (06/05/2024)   PRAPARE - Transportation    Lack  of Transportation (Medical): No    Lack of Transportation (Non-Medical): No  Physical Activity: Insufficiently Active (02/18/2024)   Exercise Vital Sign    Days of Exercise per Week: 3 days    Minutes of Exercise per Session: 20 min  Stress: No Stress Concern Present (02/18/2024)   [employer] of Occupational Health - Occupational Stress Questionnaire    Feeling of Stress: Only a little  Social Connections: Moderately Isolated (05/31/2024)   Social Connection and Isolation Panel    Frequency of  Communication with Friends and Family: Three times a week    Frequency of Social Gatherings with Friends and Family: Three times a week    Attends Religious Services: More than 4 times per year    Active Member of Clubs or Organizations: No    Attends Banker Meetings: Never    Marital Status: Divorced   Social History   Tobacco Use  Smoking Status Former   Current packs/day: 0.00   Average packs/day: 1 pack/day for 43.0 years (43.0 ttl pk-yrs)   Types: Cigarettes   Start date: 1966   Quit date: 2009   Years since quitting: 16.7  Smokeless Tobacco Never   Social History   Substance and Sexual Activity  Alcohol Use No   Social History   Substance and Sexual Activity  Drug Use No  .  FAMILY HISTORY  Family History  Problem Relation Age of Onset   Hypertension Mother    COPD Mother    Heart failure Mother    Cancer Father    Alcohol abuse Father    Colon cancer Father    Heart failure Sister    Heart attack Brother 64   Lung cancer Brother    Breast cancer Paternal Aunt        ?   Heart failure Maternal Grandmother     MENTAL STATUS EXAM (MSE)  Mental Status Exam: General Appearance: Casual  Orientation:  Other:  to person and place but not to situation  Memory:  Immediate;   Fair Recent;   Fair Remote;   Fair  Concentration:  Concentration: Fair and Attention Span: Fair  Recall:  Poor  Attention  Fair  Eye Contact:  Fair  Speech:  Normal Rate  Language:  Fair  Volume:  Normal  Mood: okay  Affect:  Congruent  Thought Process:  Disorganized  Thought Content:  Illogical, Delusions, Hallucinations: Visual, and Paranoid Ideation  Suicidal Thoughts:  No  Homicidal Thoughts:  No  Judgement:  Impaired  Insight:  Lacking  Psychomotor Activity:  Normal  Akathisia:  No  Fund of Knowledge:  Poor    Assets:  Desire for Improvement  Cognition:  WNL  ADL's:  Impaired  AIMS (if indicated):       VITALS  Blood pressure (!) 156/83, pulse 78,  temperature 97.7 F (36.5 C), temperature source Oral, resp. rate 18, height 5' 5 (1.651 m), weight 49 kg, SpO2 94%.  LABS  Admission on 06/07/2024  Component Date Value Ref Range Status   Alcohol, Ethyl (B) 06/07/2024 <15  <15 mg/dL Final   Comment: (NOTE) For medical purposes only. Performed at [facility], [address] [place], [place] [address]    Sodium 06/07/2024 137  135 - 145 mmol/L Final   Potassium 06/07/2024 3.9  3.5 - 5.1 mmol/L Final   Chloride 06/07/2024 100  98 - 111 mmol/L Final   CO2 06/07/2024 27  22 - 32 mmol/L Final   Glucose, Bld 06/07/2024  121 (H)  70 - 99 mg/dL Final   Glucose reference range applies only to samples taken after fasting for at least 8 hours.   BUN 06/07/2024 8  8 - 23 mg/dL Final   Creatinine, Ser 06/07/2024 0.36 (L)  0.44 - 1.00 mg/dL Final   Calcium 89/91/7974 8.9  8.9 - 10.3 mg/dL Final   Total Protein 89/91/7974 6.0 (L)  6.5 - 8.1 g/dL Final   Albumin 89/91/7974 3.6  3.5 - 5.0 g/dL Final   AST 89/91/7974 18  15 - 41 U/L Final   ALT 06/07/2024 17  0 - 44 U/L Final   Alkaline Phosphatase 06/07/2024 69  38 - 126 U/L Final   Total Bilirubin 06/07/2024 0.8  0.0 - 1.2 mg/dL Final   GFR, Estimated 06/07/2024 >60  >60 mL/min Final   Comment: (NOTE) Calculated using the CKD-EPI Creatinine Equation (2021)    Anion gap 06/07/2024 10  5 - 15 Final   Performed at [facility], [address] [place], [place] [address]   WBC 06/07/2024 7.6  4.0 - 10.5 K/uL Final   RBC 06/07/2024 4.75  3.87 - 5.11 MIL/uL Final   Hemoglobin 06/07/2024 13.9  12.0 - 15.0 g/dL Final   HCT 89/91/7974 42.6  36.0 - 46.0 % Final   MCV 06/07/2024 89.7  80.0 - 100.0 fL Final   MCH 06/07/2024 29.3  26.0 - 34.0 pg Final   MCHC 06/07/2024 32.6  30.0 - 36.0 g/dL Final   RDW 89/91/7974 12.5  11.5 - 15.5 % Final   Platelets 06/07/2024 272  150 - 400 K/uL Final   nRBC 06/07/2024 0.0  0.0 - 0.2 % Final   Neutrophils Relative % 06/07/2024 82  % Final    Neutro Abs 06/07/2024 6.3  1.7 - 7.7 K/uL Final   Lymphocytes Relative 06/07/2024 12  % Final   Lymphs Abs 06/07/2024 0.9  0.7 - 4.0 K/uL Final   Monocytes Relative 06/07/2024 3  % Final   Monocytes Absolute 06/07/2024 0.2  0.1 - 1.0 K/uL Final   Eosinophils Relative 06/07/2024 1  % Final   Eosinophils Absolute 06/07/2024 0.1  0.0 - 0.5 K/uL Final   Basophils Relative 06/07/2024 1  % Final   Basophils Absolute 06/07/2024 0.0  0.0 - 0.1 K/uL Final   Immature Granulocytes 06/07/2024 1  % Final   Abs Immature Granulocytes 06/07/2024 0.10 (H)  0.00 - 0.07 K/uL Final   Performed at [facility], [address] [place], [place] [address]    PSYCHIATRIC REVIEW OF SYSTEMS (ROS)  ROS: Notable for the following relevant positive findings: Review of Systems  Psychiatric/Behavioral:  Negative for depression, hallucinations, memory loss, substance abuse and suicidal ideas. The patient is nervous/anxious. The patient does not have insomnia.     Additional findings:      Musculoskeletal: No abnormal movements observed      Gait & Station: Laying/Sitting      Pain Screening: Denies      Nutrition & Dental Concerns: no concerns voiced   RISK FORMULATION/ASSESSMENT  Is the patient experiencing any suicidal or homicidal ideations: No   Protective factors considered for safety management: Current care in a highly monitored health care setting  Risk factors/concerns considered for safety management:  Physical illness/chronic pain Age over 52 Impulsivity Isolation Unwillingness to seek help Unmarried  Is there a safety management plan with the patient and treatment team to minimize risk factors and promote protective factors: Yes  Explain: psychiatric hospitalization Is crisis care placement or psychiatric hospitalization recommended: Yes     Based on my current evaluation and risk assessment, patient is determined at this time to be at:  High risk  *RISK ASSESSMENT Risk  assessment is a dynamic process; it is possible that this patient's condition, and risk level, may change. This should be re-evaluated and managed over time as appropriate. Please re-consult psychiatric consult services if additional assistance is needed in terms of risk assessment and management. If your team decides to discharge this patient, please advise the patient how to best access emergency psychiatric services, or to call 911, if their condition worsens or they feel unsafe in any way.   Charlene Buba, MD Telepsychiatry Consult Services

## 2024-06-08 NOTE — ED Notes (Signed)
 Pt changed out into hospital provided burgundy scrubs with this tech and Anyhia, EDT in the rm. Pt belongings consist of: one black sweater, one gray shirt, black/blue pants, white slippers, one white/green hairbow, blue panties, one black/tan purse with two notebooks, two inhalers, one cell phone charger, one cell phone with a fully intact screen, one red hairbrush, and one purple pouch inside of purse. Pt calm and cooperative while dressing out.

## 2024-06-08 NOTE — ED Notes (Signed)
 TTS at bedside to sign voluntary consent.

## 2024-06-08 NOTE — ED Notes (Signed)
Pt given sandwich tray and beverage. 

## 2024-06-08 NOTE — Plan of Care (Signed)
  Problem: Education: Goal: Emotional status will improve Outcome: Not Progressing   Problem: Education: Goal: Ability to state activities that reduce stress will improve Outcome: Not Progressing   Problem: Self-Concept: Goal: Level of anxiety will decrease Outcome: Not Progressing Goal: Ability to modify response to factors that promote anxiety will improve Outcome: Not Progressing

## 2024-06-08 NOTE — Group Note (Signed)
 Date:  06/08/2024 Time:  8:54 PM  Group Topic/Focus:  Healthy Communication:   The focus of this group is to discuss communication, barriers to communication, as well as healthy ways to communicate with others.    Participation Level:  Active  Participation Quality:  Appropriate  Affect:  Appropriate  Cognitive:  Appropriate  Insight: Good  Engagement in Group:  Engaged  Modes of Intervention:  Discussion  Additional Comments:    Kelli Phillips 06/08/2024, 8:54 PM

## 2024-06-08 NOTE — BH Assessment (Signed)
 PENDING DISCHARGES FROM [facility] UNIT ON 06/08/24, DAY SHIFT AC TO COORDINATE   Patient is to be admitted to [facility] .  Attending Physician will be Dr. Layne.   Intake Paper Work has been signed and placed on patient chart.  ER staff is aware of the admission: Carlene ER Secretary   Dr. Floy, ER MD  Luke Patient's Nurse

## 2024-06-08 NOTE — ED Notes (Signed)
 VOL  PENDING  PLACEMENT

## 2024-06-08 NOTE — Progress Notes (Signed)
 Patient is a 73 year old female admitted voluntarily to the [facility] Psych floor from [facility] ED for paranoia, delusions, and reports of having panic attacks. Patient presents to assessment via transport chair but is ambulatory. She is A+O x 3 with mild confusion. She currently denies SI/HI/AVH. Patient's affect is appropriate and speech is logical and coherent. Patient denies depression and anxiety. She currently denies pain. Patient denies the use of a mobility aid at home. Reports the use of reading glasses and has them on her person. Reports last BM today June 08, 2024. Patient states that her last cig was in 2009. She denies drinking alcohol. Patient says that her support system is her daughter and members at church. Her goal while she is here is "to get home soon."   Skin assessment and body search completed with Harlene, MHT. Skin: warm/dry/ WNL. No contrabands found.   Emotional support and reassurance provided throughout admission intake. Consents signed. Afterwards, oriented patient to unit, room and call light, reviewed POC with all questions answered and concerns voiced. Patient verbalized understanding. Denies any needs at this time. Will continue to monitor with ongoing Q 15 minute safety checks.

## 2024-06-08 NOTE — ED Notes (Addendum)
 Patient in assigned room (hallway).

## 2024-06-08 NOTE — ED Notes (Signed)
Report called to admission RN

## 2024-06-08 NOTE — ED Notes (Signed)
 Vol / recommend psych admission when medically cleared /patient accepted to [facility]

## 2024-06-08 NOTE — ED Notes (Signed)
 Pt ambulatory to restroom with steady gait.

## 2024-06-08 NOTE — ED Notes (Signed)
 Attempted to call report; unit will return call

## 2024-06-09 MED ORDER — PREDNISONE 10 MG PO TABS
10.0000 mg | ORAL_TABLET | Freq: Every day | ORAL | Status: AC
  Administered 2024-06-10 – 2024-06-11 (×2): 10 mg via ORAL
  Filled 2024-06-09 (×2): qty 1

## 2024-06-09 MED ORDER — ARIPIPRAZOLE 5 MG PO TABS
5.0000 mg | ORAL_TABLET | Freq: Every day | ORAL | Status: DC
  Administered 2024-06-10 – 2024-06-12 (×3): 5 mg via ORAL
  Filled 2024-06-09 (×3): qty 1

## 2024-06-09 MED ORDER — UMECLIDINIUM BROMIDE 62.5 MCG/ACT IN AEPB
1.0000 | INHALATION_SPRAY | Freq: Every day | RESPIRATORY_TRACT | Status: DC
  Administered 2024-06-10: 1 via RESPIRATORY_TRACT
  Filled 2024-06-09: qty 7

## 2024-06-09 MED ORDER — OXYCODONE-ACETAMINOPHEN 5-325 MG PO TABS: 1.0000 | ORAL_TABLET | Freq: Four times a day (QID) | ORAL | Status: DC | PRN

## 2024-06-09 MED ORDER — ARFORMOTEROL TARTRATE 15 MCG/2ML IN NEBU
15.0000 ug | INHALATION_SOLUTION | Freq: Two times a day (BID) | RESPIRATORY_TRACT | Status: DC
  Administered 2024-06-10: 15 ug via RESPIRATORY_TRACT
  Filled 2024-06-09 (×2): qty 2

## 2024-06-09 MED ORDER — PREDNISONE 5 MG PO TABS
5.0000 mg | ORAL_TABLET | Freq: Every day | ORAL | Status: AC
Start: 2024-06-12 — End: 2024-06-13
  Administered 2024-06-12 – 2024-06-13 (×2): 5 mg via ORAL
  Filled 2024-06-09 (×3): qty 1

## 2024-06-09 NOTE — Group Note (Signed)
 Physical/Occupational Therapy Group Note  Group Topic: Neurographic Art  Group Date: 06/09/2024 Start Time: 1310 End Time: 1410 Facilitators: Clive Warren CROME, OT   Group Description: Group participated with Neurographic art activity, using watercolor paints to facilitate creative expression and meditation/relaxation for each individual.  Incorporated bimanual coordination, mental focus, emotional processing, task/command following and relaxation techniques as appropriate.  Patients engaged socially with therapist and other group participants throughout session. Allowed to ask questions as appropriate, and encouraged to identify ways they could use/share their creations with themselves and others.  Therapeutic Goal(s):  Demonstrate ability to independently manipulate utensils required to participate with and complete activity. Demonstrate ability to cognitively focus on task and follow commands necessary for completion. Demonstrate use of art as an outlet for emotional processing and expression. Identify and demonstrate importance of relaxation, neural calming and meditation for improved participation with life groups.  Individual Participation: Pt did not attend.   Participation Level: Did not attend   Participation Quality:   Behavior:   Speech/Thought Process:   Affect/Mood:   Insight:   Judgement:   Modes of Intervention:   Patient Response to Interventions:    Plan: Continue to engage patient in PT/OT groups 1 - 2x/week.  Gabrial Domine R., MPH, MS, OTR/L ascom [phone] 06/09/24, 4:15 PM

## 2024-06-09 NOTE — BH IP Treatment Plan (Signed)
 Interdisciplinary Treatment and Diagnostic Plan Update  06/09/2024 Time of Session: 10:21 AM  Kelli Phillips MRN: [id number]  Principal Diagnosis: Substance induced mood disorder (HCC)  Secondary Diagnoses: Principal Problem:   Substance induced mood disorder (HCC)   Current Medications:  Current Facility-Administered Medications  Medication Dose Route Frequency Provider Last Rate Last Admin   acetaminophen  (TYLENOL ) tablet 650 mg  650 mg Oral Q6H PRN McLauchlin, Angela, NP   650 mg at 06/08/24 1949   albuterol  (VENTOLIN  HFA) 108 (90 Base) MCG/ACT inhaler 2 puff  2 puff Inhalation Q6H PRN Donnelly Mellow, MD   2 puff at 06/09/24 0939   alum & mag hydroxide-simeth (MAALOX/MYLANTA) 200-200-20 MG/5ML suspension 30 mL  30 mL Oral Q4H PRN McLauchlin, Angela, NP       feeding supplement (ENSURE PLUS HIGH PROTEIN) liquid 237 mL  237 mL Oral BID BM Jadapalle, Sree, MD   237 mL at 06/09/24 1054   magnesium hydroxide (MILK OF MAGNESIA) suspension 30 mL  30 mL Oral Daily PRN McLauchlin, Angela, NP       OLANZapine (ZYPREXA) injection 5 mg  5 mg Intramuscular TID PRN McLauchlin, Angela, NP       OLANZapine zydis (ZYPREXA) disintegrating tablet 5 mg  5 mg Oral TID PRN McLauchlin, Angela, NP   5 mg at 06/08/24 2200   PTA Medications: Medications Prior to Admission  Medication Sig Dispense Refill Last Dose/Taking   albuterol  (VENTOLIN  HFA) 108 (90 Base) MCG/ACT inhaler Inhale 2 puffs into the lungs every 6 (six) hours as needed for wheezing or shortness of breath. 8.5 g 0    cyanocobalamin  (VITAMIN B12) 1000 MCG tablet Take 2 tablets (2,000 mcg total) by mouth daily. 90 tablet 3    Glycopyrrolate -Formoterol  (BEVESPI  AEROSPHERE) 9-4.8 MCG/ACT AERO INHALE 2 PUFFS INTO THE LUNGS TWICE A DAY 10.7 each 5    lidocaine  (LIDODERM ) 5 % Place 1 patch onto the skin daily. Remove & Discard patch within 12 hours or as directed by MD (Patient not taking: Reported on 06/07/2024) 30 patch 0    [EXPIRED]  oxyCODONE -acetaminophen  (PERCOCET/ROXICET) 5-325 MG tablet Take 1 tablet by mouth every 6 (six) hours as needed for up to 5 days for moderate pain (pain score 4-6) or severe pain (pain score 7-10). 20 tablet 0    predniSONE  (DELTASONE ) 10 MG tablet 50mg  daily x 2 days, 40mg  daily x 2 days, 30mg  daily x 2, 20mg  daily x 2 days, 10mg  daily x 2 days then stop 30 tablet 0     Patient Stressors:    Patient Strengths: Ability for insight  Communication skills  Religious Affiliation  Supportive family/friends   Treatment Modalities: Medication Management, Group therapy, Case management,  1 to 1 session with clinician, Psychoeducation, Recreational therapy.   Physician Treatment Plan for Primary Diagnosis: Substance induced mood disorder (HCC) Long Term Goal(s):     Short Term Goals:    Medication Management: Evaluate patient's response, side effects, and tolerance of medication regimen.  Therapeutic Interventions: 1 to 1 sessions, Unit Group sessions and Medication administration.  Evaluation of Outcomes: Not Progressing  Physician Treatment Plan for Secondary Diagnosis: Principal Problem:   Substance induced mood disorder (HCC)  Long Term Goal(s):     Short Term Goals:       Medication Management: Evaluate patient's response, side effects, and tolerance of medication regimen.  Therapeutic Interventions: 1 to 1 sessions, Unit Group sessions and Medication administration.  Evaluation of Outcomes: Not Progressing   RN Treatment Plan for Primary  Diagnosis: Substance induced mood disorder (HCC) Long Term Goal(s): Knowledge of disease and therapeutic regimen to maintain health will improve  Short Term Goals: Ability to remain free from injury will improve, Ability to verbalize frustration and anger appropriately will improve, Ability to demonstrate self-control, Ability to participate in decision making will improve, Ability to verbalize feelings will improve, Ability to disclose and  discuss suicidal ideas, Ability to identify and develop effective coping behaviors will improve, and Compliance with prescribed medications will improve  Medication Management: RN will administer medications as ordered by provider, will assess and evaluate patient's response and provide education to patient for prescribed medication. RN will report any adverse and/or side effects to prescribing provider.  Therapeutic Interventions: 1 on 1 counseling sessions, Psychoeducation, Medication administration, Evaluate responses to treatment, Monitor vital signs and CBGs as ordered, Perform/monitor CIWA, COWS, AIMS and Fall Risk screenings as ordered, Perform wound care treatments as ordered.  Evaluation of Outcomes: Not Progressing   LCSW Treatment Plan for Primary Diagnosis: Substance induced mood disorder (HCC) Long Term Goal(s): Safe transition to appropriate next level of care at discharge, Engage patient in therapeutic group addressing interpersonal concerns.  Short Term Goals: Engage patient in aftercare planning with referrals and resources, Increase social support, Increase ability to appropriately verbalize feelings, Increase emotional regulation, Facilitate acceptance of mental health diagnosis and concerns, Facilitate patient progression through stages of change regarding substance use diagnoses and concerns, Identify triggers associated with mental health/substance abuse issues, and Increase skills for wellness and recovery  Therapeutic Interventions: Assess for all discharge needs, 1 to 1 time with Social worker, Explore available resources and support systems, Assess for adequacy in community support network, Educate family and significant other(s) on suicide prevention, Complete Psychosocial Assessment, Interpersonal group therapy.  Evaluation of Outcomes: Not Progressing   Progress in Treatment: Attending groups: Yes. and No. Participating in groups: Yes. and No. Taking medication as  prescribed: Yes. Toleration medication: Yes. Family/Significant other contact made: No, will contact:  CSW will contact if given permission  Patient understands diagnosis: Yes. Discussing patient identified problems/goals with staff: Yes. Medical problems stabilized or resolved: Yes. Denies suicidal/homicidal ideation: Yes. Issues/concerns per patient self-inventory: No. Other: None   New problem(s) identified: No, Describe:  None identified   New Short Term/Long Term Goal(s):  elimination of symptoms of psychosis, medication management for mood stabilization; elimination of SI thoughts; development of comprehensive mental wellness plan.   Patient Goals:  My goal is to get my nerves to calm down a little bit  Discharge Plan or Barriers: CSW will assist with appropriate discharge planning   Reason for Continuation of Hospitalization: Mania Medication stabilization  Estimated Length of Stay: 1 to 7 days   Last 3 [place] Suicide Severity Risk Score: Flowsheet Row Admission (Current) from 06/08/2024 in [facility] BE[facility] ED from 06/07/2024 in [facility] ED to Hosp-Admission (Discharged) from 05/31/2024 in [facility] 1C MEDICAL TELEMETRY  C-SSRS RISK CATEGORY No Risk No Risk No Risk    Last PHQ 2/9 Scores:    06/05/2024    9:16 AM 02/18/2024    8:33 AM 10/11/2023    2:21 PM  Depression screen PHQ 2/9  Decreased Interest 0 0 0  Down, Depressed, Hopeless 0 1 1  PHQ - 2 Score 0 1 1  Altered sleeping  0 0  Tired, decreased energy  0 0  Change in appetite  0 1  Feeling bad or failure about yourself  0 0  Trouble concentrating  0 0  Moving slowly or fidgety/restless  0 0  Suicidal thoughts  0 0  PHQ-9 Score  1 2  Difficult doing work/chores  Not difficult at all Not difficult at all    Scribe for Treatment Team: Lum JONETTA Croft, [facility] 06/09/2024 11:27 AM

## 2024-06-09 NOTE — Plan of Care (Signed)
   Problem: Education: Goal: Emotional status will improve Outcome: Progressing Goal: Mental status will improve Outcome: Progressing

## 2024-06-09 NOTE — Group Note (Signed)
 Date:  06/09/2024 Time:  1:30 PM  Group Topic/Focus:  Goals Group:   The focus of this group is to help patients establish daily goals to achieve during treatment and discuss how the patient can incorporate goal setting into their daily lives to aide in recovery.    Participation Level:  Active  Participation Quality:  Appropriate  Affect:  Appropriate  Cognitive:  Alert  Insight: Appropriate  Engagement in Group:  Engaged  Modes of Intervention:  Discussion  Additional Comments:    Kelli Phillips l Marguerite Jarboe 06/09/2024, 1:30 PM

## 2024-06-09 NOTE — Group Note (Signed)
 Recreation Therapy Group Note   Group Topic:Stress Management  Group Date: 06/09/2024 Start Time: 1400 End Time: 1500 Facilitators: Celestia Jeoffrey BRAVO, LRT, CTRS Location: Courtyard  Group Description: Stress Jenga. LRT and pts played games of Jenga. LRT prompted group discussion on the physical signs and symptoms of stress, similar to the feeling when you're playing Jenga and trying to remove a wooden block from the stack without it all collapsing. LRT and pt discussed the physical and mental signs of stress, as well as coping skills to manage them.   Goal Area(s) Addressed: Patient will identify physical symptoms of stress. Patient will identify coping skills for stress. Patient will build frustration tolerance skills.  Patient will increase communication.   Affect/Mood: N/A   Participation Level: Did not attend    Clinical Observations/Individualized Feedback: Patient did not attend group.   Plan: Continue to engage patient in RT group sessions 2-3x/week.   Jeoffrey BRAVO Celestia, LRT, CTRS 06/09/2024 4:53 PM

## 2024-06-09 NOTE — Plan of Care (Signed)
   Problem: Education: Goal: Knowledge of Kelli Phillips General Education information/materials will improve Outcome: Progressing Goal: Emotional status will improve Outcome: Progressing Goal: Mental status will improve Outcome: Progressing Goal: Verbalization of understanding the information provided will improve Outcome: Progressing   Problem: Activity: Goal: Interest or engagement in activities will improve Outcome: Progressing Goal: Sleeping patterns will improve Outcome: Progressing   Problem: Coping: Goal: Ability to verbalize frustrations and anger appropriately will improve Outcome: Progressing Goal: Ability to demonstrate self-control will improve Outcome: Progressing

## 2024-06-09 NOTE — Progress Notes (Signed)
   06/08/24 2100  Psych Admission Type (Psych Patients Only)  Admission Status Voluntary  Psychosocial Assessment  Patient Complaints Anxiety  Eye Contact Fair  Facial Expression Animated  Affect Anxious  Speech Logical/coherent  Interaction Assertive  Motor Activity Other (Comment) (WNL)  Appearance/Hygiene In scrubs  Behavior Characteristics Anxious  Mood Pleasant  Thought Process  Coherency Tangential  Content Preoccupation  Delusions None reported or observed  Perception WDL  Hallucination None reported or observed  Judgment Impaired  Confusion Mild  Danger to Self  Current suicidal ideation? Denies

## 2024-06-09 NOTE — [facility] Suicide Risk Assessment (Signed)
 [facility] INPATIENT:  Family/Significant Other Suicide Prevention Education  Suicide Prevention Education:  Patient Refusal for Family/Significant Other Suicide Prevention Education: The patient Kelli Phillips has refused to provide written consent for family/significant other to be provided Family/Significant Other Suicide Prevention Education during admission and/or prior to discharge.  Physician notified.  SPE completed with pt, as pt refused to consent to family contact. SPI pamphlet provided to pt and pt was encouraged to share information with support network, ask questions, and talk about any concerns relating to SPE. Pt denies access to guns/firearms and verbalized understanding of information provided. Mobile Crisis information also provided to pt.  Nadara JONELLE Fam 06/09/2024, 4:06 PM

## 2024-06-09 NOTE — Group Note (Signed)
 Date:  06/09/2024 Time:  8:42 PM  Group Topic/Focus:  Healthy Communication:   The focus of this group is to discuss communication, barriers to communication, as well as healthy ways to communicate with others.    Participation Level:  Active  Participation Quality:  Appropriate  Affect:  Appropriate  Cognitive:  Appropriate  Insight: Good  Engagement in Group:  Engaged  Modes of Intervention:  Discussion  Additional Comments:    Romero Earnie Hope 06/09/2024, 8:42 PM
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
## 2024-06-10 DIAGNOSIS — F1994 Other psychoactive substance use, unspecified with psychoactive substance-induced mood disorder: Secondary | ICD-10-CM | POA: Diagnosis not present

## 2024-06-10 MED ORDER — GLYCOPYRROLATE-FORMOTEROL 9-4.8 MCG/ACT IN AERO
2.0000 | INHALATION_SPRAY | Freq: Two times a day (BID) | RESPIRATORY_TRACT | Status: AC
Start: 2024-06-10 — End: ?
  Administered 2024-06-10 – 2024-06-16 (×12): 2 via RESPIRATORY_TRACT
  Filled 2024-06-10 (×14): qty 1
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
## 2024-06-10 NOTE — Group Note (Signed)
 Date:  06/10/2024 Time:  11:38 AM  Group Topic/Focus:  Goals Group:   The focus of this group is to help patients establish daily goals to achieve during treatment and discuss how the patient can incorporate goal setting into their daily lives to aide in recovery.    Participation Level:  Active  Participation Quality:  Appropriate and Attentive  Affect:  Appropriate and Excited  Cognitive:  Alert  Insight: Good  Engagement in Group:  Engaged  Modes of Intervention:  Activity  Additional Comments:     Maglione,Ramonte Mena E 06/10/2024, 11:38 AM
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
## 2024-06-10 NOTE — Progress Notes (Signed)
   06/10/24 1200  Psych Admission Type (Psych Patients Only)  Admission Status Voluntary  Psychosocial Assessment  Patient Complaints Anxiety  Eye Contact Fair  Facial Expression Animated  Affect Anxious  Speech Logical/coherent  Interaction Assertive  Motor Activity Other (Comment) (WNL)  Appearance/Hygiene In scrubs  Behavior Characteristics Cooperative  Mood Pleasant  Thought Process  Coherency WDL  Content Preoccupation  Delusions None reported or observed  Perception WDL  Hallucination None reported or observed  Judgment WDL  Confusion Mild  Danger to Self  Current suicidal ideation? Denies  Danger to Others  Danger to Others None reported or observed

## 2024-06-10 NOTE — Plan of Care (Signed)
  Problem: Self-Concept: Goal: Level of anxiety will decrease Outcome: Progressing Goal: Ability to modify response to factors that promote anxiety will improve Outcome: Progressing   

## 2024-06-10 NOTE — Progress Notes (Signed)
   06/09/24 1300  Psych Admission Type (Psych Patients Only)  Admission Status Voluntary  Psychosocial Assessment  Patient Complaints Nervousness  Eye Contact Fair  Facial Expression Animated  Affect Anxious  Speech Logical/coherent  Interaction Assertive  Motor Activity Other (Comment) (WNL)  Appearance/Hygiene In scrubs  Behavior Characteristics Cooperative  Mood Pleasant  Thought Process  Coherency WDL  Content Preoccupation  Delusions None reported or observed  Perception WDL  Hallucination None reported or observed  Judgment WDL  Confusion Mild  Danger to Self  Current suicidal ideation? Denies  Danger to Others  Danger to Others None reported or observed

## 2024-06-10 NOTE — Group Note (Signed)
 Date:  06/10/2024 Time:  5:03 PM  Group Topic/Focus:  Thank fulness - List 2 things that you are thankful for.    Participation Level:  Active  Participation Quality:  Appropriate  Affect:  Appropriate  Cognitive:  Alert  Insight: Appropriate  Engagement in Group:  Engaged  Modes of Intervention:  Discussion  Additional Comments:    Kelli Phillips Daring 06/10/2024, 5:03 PM

## 2024-06-11 DIAGNOSIS — F22 Delusional disorders: Secondary | ICD-10-CM

## 2024-06-11 DIAGNOSIS — F1994 Other psychoactive substance use, unspecified with psychoactive substance-induced mood disorder: Secondary | ICD-10-CM | POA: Diagnosis not present

## 2024-06-11 DIAGNOSIS — F0390 Unspecified dementia without behavioral disturbance: Secondary | ICD-10-CM

## 2024-06-11 MED ORDER — TRAZODONE HCL 100 MG PO TABS
100.0000 mg | ORAL_TABLET | Freq: Every day | ORAL | Status: DC
  Administered 2024-06-11 – 2024-06-13 (×3): 100 mg via ORAL
  Filled 2024-06-11 (×4): qty 1

## 2024-06-11 NOTE — Plan of Care (Signed)
  Problem: Coping: Goal: Ability to identify and develop effective coping behavior will improve Outcome: Progressing   Problem: Self-Concept: Goal: Ability to identify factors that promote anxiety will improve Outcome: Progressing   
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
## 2024-06-11 NOTE — Group Note (Signed)
 Date:  06/11/2024 Time:  6:57 PM  Group Topic/Focus:  Rediscovering Joy:   The focus of this group is to explore various ways to relieve stress in a positive manner.    Participation Level:  Active  Participation Quality:  Appropriate  Affect:  Appropriate  Cognitive:  Appropriate  Insight: Good  Engagement in Group:  Engaged  Modes of Intervention:  Activity  Additional Comments:  N/A  Harlene LITTIE Gavel 06/11/2024, 6:57 PM

## 2024-06-11 NOTE — Group Note (Signed)
 Date:  06/11/2024 Time:  10:58 AM  Group Topic/Focus:  Goals/Love Languages Group:   The focus of this group is to help patients establish daily goals to achieve during treatment and discuss how the patient can incorporate goal setting into their daily lives to aide in recovery. Also patients learned about the different loves languages and identified which love language traits were theirs,     Participation Level:  Active  Participation Quality:  Appropriate  Affect:  Appropriate  Cognitive:  Appropriate  Insight: Appropriate  Engagement in Group:  Engaged  Modes of Intervention:  Activity and Discussion  Additional Comments:    Kelli Phillips 06/11/2024, 10:58 AM

## 2024-06-11 NOTE — Plan of Care (Signed)

## 2024-06-11 NOTE — Progress Notes (Signed)
   06/11/24 0800  Psych Admission Type (Psych Patients Only)  Admission Status Voluntary  Psychosocial Assessment  Patient Complaints Anxiety  Eye Contact Fair  Facial Expression Animated  Affect Anxious  Speech Logical/coherent  Interaction Assertive  Motor Activity Other (Comment) (WNL)  Appearance/Hygiene In scrubs  Behavior Characteristics Cooperative  Mood Pleasant  Thought Process  Coherency WDL  Content Preoccupation  Delusions None reported or observed  Perception WDL  Hallucination None reported or observed  Judgment WDL  Confusion Mild  Danger to Self  Current suicidal ideation? Denies  Danger to Others  Danger to Others None reported or observed

## 2024-06-11 NOTE — Group Note (Signed)
 Date:  06/11/2024 Time:  10:53 PM  Group Topic/Focus:  Wrap-Up Group:   The focus of this group is to help patients review their daily goal of treatment and discuss progress on daily workbooks.    Participation Level:  Did Not Attend  Participation Quality:     Affect:     Cognitive:     Insight: None  Engagement in Group:  None  Modes of Intervention:     Additional Comments:    Tommas CHRISTELLA Bunker 06/11/2024, 10:53 PM
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
## 2024-06-11 NOTE — Group Note (Signed)
 LCSW Group Therapy Note  Group Date: 06/11/2024 Start Time: 1430 End Time: 1530   Type of Therapy and Topic:  Group Therapy - Healthy vs Unhealthy Coping Skills  Participation Level:  Active   Description of Group The focus of this group was to determine what unhealthy coping techniques typically are used by group members and what healthy coping techniques would be helpful in coping with various problems. Patients were guided in becoming aware of the differences between healthy and unhealthy coping techniques. Patients were asked to identify 2-3 healthy coping skills they would like to learn to use more effectively.  Therapeutic Goals Patients learned that coping is what human beings do all day long to deal with various situations in their lives Patients defined and discussed healthy vs unhealthy coping techniques Patients identified their preferred coping techniques and identified whether these were healthy or unhealthy Patients determined 2-3 healthy coping skills they would like to become more familiar with and use more often. Patients provided support and ideas to each other   Summary of Patient Progress:  The patient attended group. Patient proved open to input from peers and feedback from CSW. Patient demonstrated  insight into the subject matter, was respectful of peers, and participated throughout the entire session.   Therapeutic Modalities Cognitive Behavioral Therapy  [name] S [name], LCSWA 06/11/2024  3:36 PM

## 2024-06-12 ENCOUNTER — Encounter: Admitting: Family Medicine

## 2024-06-12 DIAGNOSIS — F1994 Other psychoactive substance use, unspecified with psychoactive substance-induced mood disorder: Secondary | ICD-10-CM | POA: Diagnosis not present

## 2024-06-12 DIAGNOSIS — F22 Delusional disorders: Secondary | ICD-10-CM | POA: Diagnosis not present

## 2024-06-12 DIAGNOSIS — F0390 Unspecified dementia without behavioral disturbance: Secondary | ICD-10-CM | POA: Diagnosis not present

## 2024-06-12 MED ORDER — ARIPIPRAZOLE 5 MG PO TABS
10.0000 mg | ORAL_TABLET | Freq: Every day | ORAL | Status: DC
  Administered 2024-06-13 – 2024-06-16 (×4): 10 mg via ORAL
  Filled 2024-06-12 (×4): qty 2
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
## 2024-06-12 NOTE — Plan of Care (Deleted)
  Problem: Education: Goal: Knowledge of [facility] Education information/materials will improve 06/12/2024 0456 by Birdena Glee DASEN, RN Outcome: Progressing 06/12/2024 0420 by Birdena Glee DASEN, RN Outcome: Progressing Goal: Emotional status will improve 06/12/2024 0456 by Birdena Glee DASEN, RN Outcome: Progressing 06/12/2024 0420 by Birdena Glee DASEN, RN Outcome: Progressing Goal: Mental status will improve 06/12/2024 0456 by Birdena Glee DASEN, RN Outcome: Progressing 06/12/2024 0420 by Birdena Glee DASEN, RN Outcome: Progressing Goal: Verbalization of understanding the information provided will improve 06/12/2024 0456 by Birdena Glee DASEN, RN Outcome: Progressing 06/12/2024 0420 by Birdena Glee DASEN, RN Outcome: Progressing   Problem: Activity: Goal: Interest or engagement in activities will improve 06/12/2024 0456 by Birdena Glee DASEN, RN Outcome: Progressing 06/12/2024 0420 by Birdena Glee DASEN, RN Outcome: Progressing Goal: Sleeping patterns will improve 06/12/2024 0456 by Birdena Glee DASEN, RN Outcome: Progressing 06/12/2024 0420 by Birdena Glee DASEN, RN Outcome: Progressing

## 2024-06-12 NOTE — Group Note (Signed)
 Date:  06/12/2024 Time:  9:09 PM  Group Topic/Focus:  Wrap-Up Group:   The focus of this group is to help patients review their daily goal of treatment and discuss progress on daily workbooks.    Participation Level:  Active  Participation Quality:  Appropriate  Affect:  Appropriate  Cognitive:  Appropriate  Insight: Appropriate  Engagement in Group:  Engaged  Modes of Intervention:  Discussion  Additional Comments:    Sherrilyn JAYSON Redman 06/12/2024, 9:09 PM

## 2024-06-12 NOTE — Plan of Care (Signed)
?  Problem: Education: ?Goal: Knowledge of  General Education information/materials will improve ?Outcome: Progressing ?Goal: Emotional status will improve ?Outcome: Progressing ?Goal: Mental status will improve ?Outcome: Progressing ?Goal: Verbalization of understanding the information provided will improve ?Outcome: Progressing ?  ?Problem: Activity: ?Goal: Interest or engagement in activities will improve ?Outcome: Progressing ?Goal: Sleeping patterns will improve ?Outcome: Progressing ?  ?Problem: Coping: ?Goal: Ability to verbalize frustrations and anger appropriately will improve ?Outcome: Progressing ?Goal: Ability to demonstrate self-control will improve ?Outcome: Progressing ?  ?Problem: Health Behavior/Discharge Planning: ?Goal: Identification of resources available to assist in meeting health care needs will improve ?Outcome: Progressing ?Goal: Compliance with treatment plan for underlying cause of condition will improve ?Outcome: Progressing ?  ?Problem: Safety: ?Goal: Periods of time without injury will increase ?Outcome: Progressing ?  ?

## 2024-06-12 NOTE — Group Note (Signed)
 Physical/Occupational Therapy Group Note  Group Topic: Yoga  Group Date: 06/12/2024 Start Time: 1300 End Time: 1330 Facilitators: Dameon Soltis, Alm Hamilton, PT   Group Description: Group participated with series of yoga poses, designed to emphasize functional sitting balance, core stability, generalized flexibility and overall posture.  Incorporated deep breathing techniques with poses, working to promote relaxation, mindfulness and focus with targeted activities.   Discussed benefits of yoga in improving mood and self-esteem, reducing stress and anxiety, and promoting functional strength and balance for each participant.  Discussed ways to integrate into each participant's daily routine.  Provided handout with written and pictorial descriptions of included yoga movements to be utilized as appropriate outside of group time.  Therapeutic Goal(s):  Demonstrate safe ability to participate with yoga poses during group activity. Identify one benefit of participation with yoga poses as part of each participant's exercise/movement routine. Identify 1-2 individual poses that participant feels most beneficial to his/her needs and that he/she can easily replicate outside of group.  Individual Participation: Pt actively participated with the discussion and physical activity components of the session and was able with min cuing to modify poses as needed for patient comfort.  Participation Level: Active and Engaged   Participation Quality: Minimal Cues   Behavior: Appropriate   Speech/Thought Process: Mostly coherent and relevant with occasional loose association; talkative and pleasant throughout the session    Affect/Mood: Appropriate   Insight: Moderate   Judgement: Moderate   Modes of Intervention: Activity, Discussion, and Education  Patient Response to Interventions:  Attentive, Engaged, Interested , and Receptive   Plan: Continue to engage patient in PT/OT groups 1 - 2x/week.  CHARM Hamilton Bertin  PT, DPT 06/12/24, 2:11 PM

## 2024-06-12 NOTE — Progress Notes (Signed)
   06/12/24 1600  Psych Admission Type (Psych Patients Only)  Admission Status Voluntary  Psychosocial Assessment  Patient Complaints Anxiety  Eye Contact Fair  Facial Expression Animated  Affect Anxious  Speech Logical/coherent  Interaction Assertive  Motor Activity Other (Comment) (WNL)  Appearance/Hygiene In scrubs  Behavior Characteristics Cooperative  Mood Pleasant  Thought Process  Coherency WDL  Content Preoccupation  Delusions None reported or observed  Perception WDL  Hallucination None reported or observed  Judgment WDL  Confusion Mild  Danger to Self  Current suicidal ideation? Denies  Danger to Others  Danger to Others None reported or observed

## 2024-06-12 NOTE — Progress Notes (Signed)
(  Sleep Hours) - 5.75 (Any PRNs that were needed, meds refused, or side effects to meds)- 1919-albuterol   (Any disturbances and when (visitation, over night)-N/A (Concerns raised by the patient)- PT requesting something to sleep... I just didn't sleep well last night; I had a nightmare just didn't rest well?  (SI/HI/AVH)-Denies

## 2024-06-12 NOTE — Group Note (Signed)
 Recreation Therapy Group Note   Group Topic:Coping Skills  Group Date: 06/12/2024 Start Time: 1100 End Time: 1130 Facilitators: Celestia Jeoffrey BRAVO, LRT, CTRS Location: Dayroom  Group Description: Seated Exercise. LRT discussed the mental and physical benefits of exercise. LRT and group discussed how physical activity can be used as a coping skill. Pt's and LRT followed along to an exercise video on the TV screen that provided a visual representation and audio description of every exercise performed. Pt's encouraged to listen to their bodies and stop at any time if they experience feelings of discomfort or pain. Pts were encouraged to drink water and stay hydrated.   Goal Area(s) Addressed:  Patient will learn benefits of physical activity. Patient will identify exercise as a coping skill.  Patient will follow multistep directions. Patient will try a new leisure interest.    Affect/Mood: Appropriate   Participation Level: Moderate   Participation Quality: Independent   Behavior: Calm and Cooperative   Speech/Thought Process: Coherent   Insight: Good   Judgement: Good   Modes of Intervention: Activity, Education, and Exploration   Patient Response to Interventions:  Engaged and Receptive   Education Outcome:  In group clarification offered    Clinical Observations/Individualized Feedback: [facility] was mostly active in their participation of session activities and group discussion. Pt completed some of the exercises as prompted.    Plan: Continue to engage patient in RT group sessions 2-3x/week.   Jeoffrey BRAVO Celestia, LRT, CTRS 06/12/2024 1:50 PM

## 2024-06-12 NOTE — Progress Notes (Signed)
   06/11/24 1947  Psych Admission Type (Psych Patients Only)  Admission Status Voluntary  Psychosocial Assessment  Patient Complaints Anxiety  Eye Contact Fair  Facial Expression Animated  Affect Anxious  Speech Logical/coherent  Interaction Assertive  Motor Activity Tremors  Appearance/Hygiene Unremarkable  Behavior Characteristics Cooperative  Mood Pleasant  Aggressive Behavior  Effect No apparent injury  Thought Process  Coherency WDL  Content Preoccupation  Delusions None reported or observed  Perception WDL  Hallucination None reported or observed  Judgment WDL  Confusion Mild  Danger to Self  Current suicidal ideation? Denies  Danger to Others  Danger to Others None reported or observed

## 2024-06-12 NOTE — Group Note (Signed)
 Date:  06/12/2024 Time:  11:21 AM  Group Topic/Focus:  Building Self Esteem:   The Focus of this group is helping patients become aware of the effects of self-esteem on their lives, the things they and others do that enhance or undermine their self-esteem, seeing the relationship between their level of self-esteem and the choices they make and learning ways to enhance self-esteem.    Participation Level:  Active  Participation Quality:  Appropriate  Affect:  Appropriate  Cognitive:  Appropriate  Insight: Appropriate  Engagement in Group:  Engaged  Modes of Intervention:  Discussion   [facility] 06/12/2024, 11:21 AM

## 2024-06-12 NOTE — Plan of Care (Signed)
   Problem: Activity: Goal: Interest or engagement in activities will improve Outcome: Progressing Goal: Sleeping patterns will improve Outcome: Progressing

## 2024-06-12 NOTE — Group Note (Signed)
 Recreation Therapy Group Note   Group Topic:Leisure Education  Group Date: 06/12/2024 Start Time: 1500 End Time: 1550 Facilitators: Celestia Jeoffrey BRAVO, LRT, CTRS Location: Courtyard  Group Description: Music. Patients are encouraged to name their favorite song(s) for LRT to play song through speaker for group to hear, while in the courtyard getting fresh air and sunlight. Patients educated on the definition of leisure and the importance of having different leisure interests outside of the hospital. Group discussed how leisure activities can often be used as Pharmacologist and that listening to music and being outside are examples.    Goal Area(s) Addressed:  Patient will identify a current leisure interest.  Patient will practice making a positive decision. Patient will have the opportunity to try a new leisure activity.   Affect/Mood: Appropriate   Participation Level: Active and Engaged   Participation Quality: Independent   Behavior: Appropriate, Calm, and Cooperative   Speech/Thought Process: Coherent   Insight: Good   Judgement: Good   Modes of Intervention: Music and Open Conversation   Patient Response to Interventions:  Attentive, Engaged, and Receptive   Education Outcome:  Acknowledges education   Clinical Observations/Individualized Feedback: [facility] was active in their participation of session activities and group discussion. Pt interacted well with LRT and peers duration of session.    Plan: Continue to engage patient in RT group sessions 2-3x/week.   Jeoffrey BRAVO Celestia, LRT, CTRS 06/12/2024 4:12 PM

## 2024-06-13 ENCOUNTER — Telehealth: Payer: Self-pay

## 2024-06-13 DIAGNOSIS — F1994 Other psychoactive substance use, unspecified with psychoactive substance-induced mood disorder: Secondary | ICD-10-CM | POA: Diagnosis not present

## 2024-06-13 DIAGNOSIS — F22 Delusional disorders: Secondary | ICD-10-CM | POA: Diagnosis not present

## 2024-06-13 DIAGNOSIS — F0390 Unspecified dementia without behavioral disturbance: Secondary | ICD-10-CM | POA: Diagnosis not present

## 2024-06-13 NOTE — Progress Notes (Addendum)
 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 Plan is for patient to discharge home on Friday.  Patient currently denies SI, HI, AVH, anxiety and depression. She participates in meals and groups and gets along well with staff and peers.  She did have to be redirected today for standing in the doorway inside the room of a female patient.  Will continue to monitor.

## 2024-06-13 NOTE — Group Note (Signed)
 Recreation Therapy Group Note   Group Topic:Health and Wellness  Group Date: 06/13/2024 Start Time: 1345 End Time: 1430 Facilitators: Celestia Jeoffrey BRAVO, LRT, CTRS Location: Courtyard  Group Description: Outdoor Recreation. Patients had the option to play corn hole, ring toss, UNO, or listening to music while outside in the courtyard getting fresh air and sunlight. Patients helped water and prune the raised garden beds. LRT and patients discussed things that they enjoy doing in their free time outside of the hospital. LRT encouraged patients to drink water after being active and getting their heart rate up.   Goal Area(s) Addressed: Patient will identify leisure interests.  Patient will practice healthy decision making. Patient will engage in recreation activity.   Affect/Mood: Appropriate   Participation Level: Active and Engaged   Participation Quality: Independent   Behavior: Appropriate, Calm, and Cooperative   Speech/Thought Process: Coherent   Insight: Good   Judgement: Good   Modes of Intervention: Music, Open Conversation, and Rapport Building   Patient Response to Interventions:  Attentive, Engaged, Interested , and Receptive   Education Outcome:  Acknowledges education   Clinical Observations/Individualized Feedback: [facility] was active in their participation of session activities and group discussion. Pt identified that she is looking forward to be able to play music on her new laptop when she gets home.    Plan: Continue to engage patient in RT group sessions 2-3x/week.   Jeoffrey BRAVO Celestia, LRT, CTRS 06/13/2024 2:39 PM

## 2024-06-13 NOTE — Plan of Care (Signed)
  Problem: Education: Goal: Knowledge of [place] [facility] Education information/materials will improve 06/13/2024 0456 by Birdena Glee DASEN, RN Outcome: Progressing 06/13/2024 0456 by Birdena Glee DASEN, RN Outcome: Progressing Goal: Emotional status will improve 06/13/2024 0456 by Birdena Glee DASEN, RN Outcome: Progressing 06/13/2024 0456 by Birdena Glee DASEN, RN Outcome: Progressing Goal: Mental status will improve 06/13/2024 0456 by Birdena Glee DASEN, RN Outcome: Progressing 06/13/2024 0456 by Birdena Glee DASEN, RN Outcome: Progressing Goal: Verbalization of understanding the information provided will improve 06/13/2024 0456 by Birdena Glee DASEN, RN Outcome: Progressing 06/13/2024 0456 by Birdena Glee DASEN, RN Outcome: Progressing   Problem: Activity: Goal: Interest or engagement in activities will improve 06/13/2024 0456 by Birdena Glee DASEN, RN Outcome: Progressing 06/13/2024 0456 by Birdena Glee DASEN, RN Outcome: Progressing Goal: Sleeping patterns will improve 06/13/2024 0456 by Birdena Glee DASEN, RN Outcome: Progressing 06/13/2024 0456 by Birdena Glee DASEN, RN Outcome: Progressing   Problem: Health Behavior/Discharge Planning: Goal: Identification of resources available to assist in meeting health care needs will improve 06/13/2024 0456 by Birdena Glee DASEN, RN Outcome: Progressing 06/13/2024 0456 by Birdena Glee DASEN, RN Outcome: Progressing Goal: Compliance with treatment plan for underlying cause of condition will improve 06/13/2024 0456 by Birdena Glee DASEN, RN Outcome: Progressing 06/13/2024 0456 by Birdena Glee DASEN, RN Outcome: Progressing

## 2024-06-13 NOTE — Group Note (Signed)
 Date:  06/13/2024 Time:  11:09 PM  Group Topic/Focus:  Wrap-Up Group:   The focus of this group is to help patients review their daily goal of treatment and discuss progress on daily workbooks.    Participation Level:  Active  Participation Quality:  Appropriate  Affect:  Appropriate  Cognitive:  Alert  Insight: Appropriate  Engagement in Group:  Engaged  Modes of Intervention:  Discussion  Additional Comments:    [facility] Bunker 06/13/2024, 11:09 PM

## 2024-06-13 NOTE — Group Note (Signed)
 Recreation Therapy Group Note   Group Topic:Relaxation  Group Date: 06/13/2024 Start Time: 1100 End Time: 1130 Facilitators: Celestia Jeoffrey BRAVO, LRT, CTRS Location: Dayroom  Group Description: Chair Yoga. LRT and patients discussed the benefits of yoga and how it differs from strength exercises. LRT educated patients on the mental and physical benefits of yoga and deep breathing and how it can be used as a Associate Professor. LRT instructed patients on different stretching and yoga poses to complete that focused on all parts of the body, as well as deep breathing. Pt encouraged to stop movement at any time if they feel discomfort or pain.   Goal Area(s) Addressed: Patient will practice using relaxation technique. Patient will identify a new coping skill.  Patient will follow multistep directions to reduce anxiety and stress.   Affect/Mood: Appropriate   Participation Level: Active and Engaged   Participation Quality: Independent   Behavior: Calm and Cooperative   Speech/Thought Process: Coherent   Insight: Good   Judgement: Good   Modes of Intervention: Education and Exploration   Patient Response to Interventions:  Attentive, Engaged, and Receptive   Education Outcome:  Acknowledges education   Clinical Observations/Individualized Feedback: [facility] was active in their participation of session activities and group discussion. Pt completed all exercises as prompted.    Plan: Continue to engage patient in RT group sessions 2-3x/week.   Jeoffrey BRAVO Celestia, LRT, CTRS 06/13/2024 12:25 PM

## 2024-06-13 NOTE — Progress Notes (Signed)
   06/12/24 2100  Psych Admission Type (Psych Patients Only)  Admission Status Voluntary  Psychosocial Assessment  Patient Complaints Anxiety  Eye Contact Fair  Facial Expression Animated  Affect Anxious  Speech Logical/coherent  Interaction Assertive  Motor Activity Tremors  Appearance/Hygiene Unremarkable  Behavior Characteristics Cooperative  Mood Pleasant  Aggressive Behavior  Effect No apparent injury  Thought Process  Coherency WDL  Content Preoccupation  Delusions None reported or observed  Perception WDL  Hallucination None reported or observed  Judgment WDL  Confusion Mild  Danger to Self  Current suicidal ideation? Denies  Danger to Others  Danger to Others None reported or observed

## 2024-06-13 NOTE — Group Note (Signed)
 [facility] LCSW Group Therapy Note    Group Date: 06/13/2024 Start Time: 1315 End Time: 1345  Type of Therapy and Topic:  Group Therapy:  Overcoming Obstacles  Participation Level:  [facility] PARTICIPATION LEVEL: Active  Mood:  Description of Group:   In this group patients will be encouraged to explore what they see as obstacles to their own wellness and recovery. They will be guided to discuss their thoughts, feelings, and behaviors related to these obstacles. The group will process together ways to cope with barriers, with attention given to specific choices patients can make. Each patient will be challenged to identify changes they are motivated to make in order to overcome their obstacles. This group will be process-oriented, with patients participating in exploration of their own experiences as well as giving and receiving support and challenge from other group members.  Therapeutic Goals: 1. Patient will identify personal and current obstacles as they relate to admission. 2. Patient will identify barriers that currently interfere with their wellness or overcoming obstacles.  3. Patient will identify feelings, thought process and behaviors related to these barriers. 4. Patient will identify two changes they are willing to make to overcome these obstacles:    Summary of Patient Progress   Pt was active and appropriate throughout group. Pt open to feedback from peers and group leader.    Therapeutic Modalities:   Cognitive Behavioral Therapy Solution Focused Therapy Motivational Interviewing Relapse Prevention Therapy   Lum JONETTA Croft, LCSWA
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
## 2024-06-13 NOTE — Plan of Care (Signed)
  Problem: Education: Goal: Ability to state activities that reduce stress will improve 06/13/2024 0457 by Birdena Glee DASEN, RN Outcome: Progressing 06/13/2024 0456 by Birdena Glee DASEN, RN Outcome: Progressing   Problem: Self-Concept: Goal: Ability to identify factors that promote anxiety will improve 06/13/2024 0457 by Birdena Glee DASEN, RN Outcome: Progressing 06/13/2024 0456 by Birdena Glee T, RN Outcome: Progressing Goal: Level of anxiety will decrease 06/13/2024 0457 by Birdena Glee DASEN, RN Outcome: Progressing 06/13/2024 0456 by Birdena Glee DASEN, RN Outcome: Progressing Goal: Ability to modify response to factors that promote anxiety will improve 06/13/2024 0457 by Birdena Glee DASEN, RN Outcome: Progressing 06/13/2024 0456 by Birdena Glee DASEN, RN Outcome: Progressing   Problem: Coping: Goal: Ability to identify and develop effective coping behavior will improve 06/13/2024 0457 by Birdena Glee DASEN, RN Outcome: Progressing 06/13/2024 0456 by Birdena Glee DASEN, RN Outcome: Progressing

## 2024-06-13 NOTE — Group Note (Signed)
 Date:  06/13/2024 Time:  11:09 AM  Group Topic/Focus:  Managing Feelings:   The focus of this group is to identify what feelings patients have difficulty handling and develop a plan to handle them in a healthier way upon discharge.    Participation Level:  Active  Participation Quality:  Appropriate  Affect:  Appropriate  Cognitive:  Appropriate  Insight: Appropriate  Engagement in Group:  Engaged  Modes of Intervention:  Discussion  Arland Nutting 06/13/2024, 11:09 AM

## 2024-06-13 NOTE — Progress Notes (Signed)
(  Sleep Hours) - 7.00 (Any PRNs that were needed, meds refused, or side effects to meds)- N/A (Any disturbances and when (visitation, over night)-N/A (Concerns raised by the patient)- None (SI/HI/AVH)-Denies

## 2024-06-14 DIAGNOSIS — F0390 Unspecified dementia without behavioral disturbance: Secondary | ICD-10-CM | POA: Diagnosis not present

## 2024-06-14 DIAGNOSIS — F22 Delusional disorders: Secondary | ICD-10-CM | POA: Diagnosis not present

## 2024-06-14 DIAGNOSIS — F1994 Other psychoactive substance use, unspecified with psychoactive substance-induced mood disorder: Secondary | ICD-10-CM | POA: Diagnosis not present

## 2024-06-14 MED ORDER — ENSURE PLUS HIGH PROTEIN PO LIQD: 237.0000 mL | Freq: Three times a day (TID) | ORAL | Status: DC

## 2024-06-14 MED ORDER — ADULT MULTIVITAMIN W/MINERALS CH
1.0000 | ORAL_TABLET | Freq: Every day | ORAL | Status: DC
  Administered 2024-06-14 – 2024-06-16 (×3): 1 via ORAL
  Filled 2024-06-14 (×3): qty 1

## 2024-06-14 NOTE — Group Note (Signed)
 Date:  06/14/2024 Time:  9:29 PM  Group Topic/Focus:  Wrap-Up Group:   The focus of this group is to help patients review their daily goal of treatment and discuss progress on daily workbooks.    Participation Level:  Active  Participation Quality:  Appropriate  Affect:  Appropriate  Cognitive:  Alert  Insight: Appropriate  Engagement in Group:  Engaged  Modes of Intervention:  Discussion  Additional Comments:    Kelli Phillips CHRISTELLA Bunker 06/14/2024, 9:29 PM

## 2024-06-14 NOTE — [facility] Counselor (Signed)
 CSW contacted [facility] [phone] per provider's request to check on open case per providers request.   CSW spoke to hotline operator who reports she will give CSW's number to the caseworker working the case.   CSW awaits phone call from caseworker.   Lum Croft, MSW, [place] 06/14/2024 2:07 PM
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
## 2024-06-14 NOTE — BH IP Treatment Plan (Signed)
 Interdisciplinary Treatment and Diagnostic Plan Update  06/14/2024 Time of Session: 9:00 AM Kelli Phillips MRN: [id number]  Principal Diagnosis: Substance induced mood disorder (HCC)  Secondary Diagnoses: Principal Problem:   Substance induced mood disorder (HCC)   Current Medications:  Current Facility-Administered Medications  Medication Dose Route Frequency Provider Last Rate Last Admin   acetaminophen  (TYLENOL ) tablet 650 mg  650 mg Oral Q6H PRN McLauchlin, Jon, NP   650 mg at 06/13/24 0810   albuterol  (VENTOLIN  HFA) 108 (90 Base) MCG/ACT inhaler 2 puff  2 puff Inhalation Q6H PRN Donnelly Mellow, MD   2 puff at 06/14/24 0841   alum & mag hydroxide-simeth (MAALOX/MYLANTA) 200-200-20 MG/5ML suspension 30 mL  30 mL Oral Q4H PRN McLauchlin, Angela, NP       ARIPiprazole (ABILIFY) tablet 10 mg  10 mg Oral Daily Jadapalle, Sree, MD   10 mg at 06/14/24 1000   feeding supplement (ENSURE PLUS HIGH PROTEIN) liquid 237 mL  237 mL Oral TID BM Jadapalle, Sree, MD       Glycopyrrolate -Formoterol  9-4.8 MCG/ACT AERO 2 puff  2 puff Inhalation BID Jadapalle, Sree, MD   2 puff at 06/14/24 0959   magnesium hydroxide (MILK OF MAGNESIA) suspension 30 mL  30 mL Oral Daily PRN McLauchlin, Angela, NP       multivitamin with minerals tablet 1 tablet  1 tablet Oral Daily Jadapalle, Sree, MD   1 tablet at 06/14/24 1000   OLANZapine (ZYPREXA) injection 5 mg  5 mg Intramuscular TID PRN McLauchlin, Angela, NP       OLANZapine zydis (ZYPREXA) disintegrating tablet 5 mg  5 mg Oral TID PRN McLauchlin, Angela, NP   5 mg at 06/08/24 2200   oxyCODONE -acetaminophen  (PERCOCET/ROXICET) 5-325 MG per tablet 1 tablet  1 tablet Oral Q6H PRN Jadapalle, Sree, MD       traZODone (DESYREL) tablet 100 mg  100 mg Oral QHS Jadapalle, Sree, MD   100 mg at 06/13/24 2205   PTA Medications: Medications Prior to Admission  Medication Sig Dispense Refill Last Dose/Taking   albuterol  (VENTOLIN  HFA) 108 (90 Base) MCG/ACT inhaler Inhale  2 puffs into the lungs every 6 (six) hours as needed for wheezing or shortness of breath. 8.5 g 0    cyanocobalamin  (VITAMIN B12) 1000 MCG tablet Take 2 tablets (2,000 mcg total) by mouth daily. 90 tablet 3    Glycopyrrolate -Formoterol  (BEVESPI  AEROSPHERE) 9-4.8 MCG/ACT AERO INHALE 2 PUFFS INTO THE LUNGS TWICE A DAY 10.7 each 5    lidocaine  (LIDODERM ) 5 % Place 1 patch onto the skin daily. Remove & Discard patch within 12 hours or as directed by MD (Patient not taking: Reported on 06/07/2024) 30 patch 0    [EXPIRED] oxyCODONE -acetaminophen  (PERCOCET/ROXICET) 5-325 MG tablet Take 1 tablet by mouth every 6 (six) hours as needed for up to 5 days for moderate pain (pain score 4-6) or severe pain (pain score 7-10). 20 tablet 0    predniSONE  (DELTASONE ) 10 MG tablet 50mg  daily x 2 days, 40mg  daily x 2 days, 30mg  daily x 2, 20mg  daily x 2 days, 10mg  daily x 2 days then stop 30 tablet 0     Patient Stressors:    Patient Strengths: Ability for insight  Communication skills  Religious Affiliation  Supportive family/friends   Treatment Modalities: Medication Management, Group therapy, Case management,  1 to 1 session with clinician, Psychoeducation, Recreational therapy.   Physician Treatment Plan for Primary Diagnosis: Substance induced mood disorder (HCC) Long Term Goal(s): Improvement in symptoms  so as ready for discharge   Short Term Goals: Ability to identify changes in lifestyle to reduce recurrence of condition will improve Ability to verbalize feelings will improve Ability to disclose and discuss suicidal ideas Ability to demonstrate self-control will improve Ability to identify and develop effective coping behaviors will improve  Medication Management: Evaluate patient's response, side effects, and tolerance of medication regimen.  Therapeutic Interventions: 1 to 1 sessions, Unit Group sessions and Medication administration.  Evaluation of Outcomes: Progressing  Physician Treatment  Plan for Secondary Diagnosis: Principal Problem:   Substance induced mood disorder (HCC)  Long Term Goal(s): Improvement in symptoms so as ready for discharge   Short Term Goals: Ability to identify changes in lifestyle to reduce recurrence of condition will improve Ability to verbalize feelings will improve Ability to disclose and discuss suicidal ideas Ability to demonstrate self-control will improve Ability to identify and develop effective coping behaviors will improve     Medication Management: Evaluate patient's response, side effects, and tolerance of medication regimen.  Therapeutic Interventions: 1 to 1 sessions, Unit Group sessions and Medication administration.  Evaluation of Outcomes: Progressing   RN Treatment Plan for Primary Diagnosis: Substance induced mood disorder (HCC) Long Term Goal(s): Knowledge of disease and therapeutic regimen to maintain health will improve  Short Term Goals:  Ability to remain free from injury will improve, Ability to verbalize frustration and anger appropriately will improve, Ability to demonstrate self-control, Ability to participate in decision making will improve, Ability to verbalize feelings will improve, Ability to disclose and discuss suicidal ideas, Ability to identify and develop effective coping behaviors will improve, and Compliance with prescribed medications will improve   Medication Management: RN will administer medications as ordered by provider, will assess and evaluate patient's response and provide education to patient for prescribed medication. RN will report any adverse and/or side effects to prescribing provider.  Therapeutic Interventions: 1 on 1 counseling sessions, Psychoeducation, Medication administration, Evaluate responses to treatment, Monitor vital signs and CBGs as ordered, Perform/monitor CIWA, COWS, AIMS and Fall Risk screenings as ordered, Perform wound care treatments as ordered.  Evaluation of Outcomes:  Progressing   LCSW Treatment Plan for Primary Diagnosis: Substance induced mood disorder (HCC) Long Term Goal(s): Safe transition to appropriate next level of care at discharge, Engage patient in therapeutic group addressing interpersonal concerns.  Short Term Goals: Engage patient in aftercare planning with referrals and resources, Increase social support, Increase ability to appropriately verbalize feelings, Increase emotional regulation, Facilitate acceptance of mental health diagnosis and concerns, Facilitate patient progression through stages of change regarding substance use diagnoses and concerns, Identify triggers associated with mental health/substance abuse issues, and Increase skills for wellness and recovery   Therapeutic Interventions: Assess for all discharge needs, 1 to 1 time with Social worker, Explore available resources and support systems, Assess for adequacy in community support network, Educate family and significant other(s) on suicide prevention, Complete Psychosocial Assessment, Interpersonal group therapy.  Evaluation of Outcomes: Progressing   Progress in Treatment: Attending groups: Yes. and No. 06/14/24 Update: Yes.  Participating in groups: Yes. and No. 06/14/24 Update: Yes.  Taking medication as prescribed: Yes. 06/14/24 Update: Yes.  Toleration medication: Yes. 06/14/24 Update: Yes.  Family/Significant other contact made: No, will contact:  CSW will contact if given permission 06/14/24 Update: No, SPE completed with pt, as pt refused to consent to family contact. SPI pamphlet provided to pt and pt was encouraged to share information with support network, ask questions, and talk about any concerns relating  to SPE. Pt denies access to guns/firearms and verbalized understanding of information provided. Mobile Crisis information also provided to pt.   Patient understands diagnosis: Yes. 06/14/24 Update: Yes.  Discussing patient identified problems/goals with staff:  Yes. 06/14/24 Update: Yes.  Medical problems stabilized or resolved: Yes. 06/14/24 Update:Yes.  Denies suicidal/homicidal ideation: Yes. 06/14/24 Update:Yes.  Issues/concerns per patient self-inventory: No. Other: None 06/14/24 Update: No. Other: None   New problem(s) identified: No, Describe:  None identified  06/14/24 Update: No, Describe:  None identified    New Short Term/Long Term Goal(s):  elimination of symptoms of psychosis, medication management for mood stabilization; elimination of SI thoughts; development of comprehensive mental wellness plan.    Patient Goals:  My goal is to get my nerves to calm down a little bit 06/14/24 Update: Patient goal to remain the same.    Discharge Plan or Barriers: CSW will assist with appropriate discharge planning 06/14/24 Update:  CSW working with team and patient to get essential resources in place to ensure strengthened support for safe discharge.   Reason for Continuation of Hospitalization: Mania Medication stabilization   Estimated Length of Stay: 1 to 7 days 06/14/24 Update: 06/16/24 Pending or TBD.   Last 3 [place] Suicide Severity Risk Score: Flowsheet Row Admission (Current) from 06/08/2024 in [facility] BE[facility] ED from 06/07/2024 in [facility] Emergency Department at [facility] ED to Hosp-Admission (Discharged) from 05/31/2024 in [facility] 1C MEDICAL TELEMETRY  C-SSRS RISK CATEGORY No Risk No Risk No Risk    Last PHQ 2/9 Scores:    06/05/2024    9:16 AM 02/18/2024    8:33 AM 10/11/2023    2:21 PM  Depression screen PHQ 2/9  Decreased Interest 0 0 0  Down, Depressed, Hopeless 0 1 1  PHQ - 2 Score 0 1 1  Altered sleeping  0 0  Tired, decreased energy  0 0  Change in appetite  0 1  Feeling bad or failure about yourself   0 0  Trouble concentrating  0 0  Moving slowly or fidgety/restless  0 0  Suicidal thoughts  0 0  PHQ-9 Score  1 2  Difficult doing work/chores  Not difficult at  all Not difficult at all    Scribe for Treatment Team: Alveta CHRISTELLA Kerns, LCSW 06/14/2024 1:38 PM

## 2024-06-14 NOTE — Plan of Care (Signed)
°  Problem: Education: Goal: Knowledge of [facility] Education information/materials will improve Outcome: Progressing Goal: Emotional status will improve Outcome: Progressing Goal: Mental status will improve Outcome: Progressing Goal: Verbalization of understanding the information provided will improve Outcome: Progressing   Problem: Activity: Goal: Interest or engagement in activities will improve Outcome: Progressing Goal: Sleeping patterns will improve Outcome: Progressing   Problem: Coping: Goal: Ability to verbalize frustrations and anger appropriately will improve Outcome: Progressing Goal: Ability to demonstrate self-control will improve Outcome: Progressing   Problem: Health Behavior/Discharge Planning: Goal: Identification of resources available to assist in meeting health care needs will improve Outcome: Progressing Goal: Compliance with treatment plan for underlying cause of condition will improve Outcome: Progressing   Problem: Physical Regulation: Goal: Ability to maintain clinical measurements within normal limits will improve Outcome: Progressing   Problem: Safety: Goal: Periods of time without injury will increase Outcome: Progressing   Problem: Education: Goal: Ability to state activities that reduce stress will improve Outcome: Progressing   Problem: Coping: Goal: Ability to identify and develop effective coping behavior will improve Outcome: Progressing   Problem: Self-Concept: Goal: Ability to identify factors that promote anxiety will improve Outcome: Progressing Goal: Level of anxiety will decrease Outcome: Progressing Goal: Ability to modify response to factors that promote anxiety will improve Outcome: Progressing   Problem: Education: Goal: Ability to state activities that reduce stress will improve Outcome: Progressing   Problem: Coping: Goal: Ability to identify and develop effective coping behavior will improve Outcome:  Progressing   Problem: Self-Concept: Goal: Ability to identify factors that promote anxiety will improve Outcome: Progressing Goal: Level of anxiety will decrease Outcome: Progressing Goal: Ability to modify response to factors that promote anxiety will improve Outcome: Progressing

## 2024-06-14 NOTE — Progress Notes (Signed)
 NUTRITION ASSESSMENT  Pt identified as at risk on the Malnutrition Screen Tool  INTERVENTION:  -Continue regular diet -MVI with minerals daily -Ensure Plus High Protein po TID, each supplement provides 350 kcal and 20 grams of protein   NUTRITION DIAGNOSIS: Unintentional weight loss related to sub-optimal intake as evidenced by pt report.   Goal: Pt to meet >/= 90% of their estimated nutrition needs.  Monitor:  PO intake  Assessment:  73 year old female with a documented history of COPD, chronic diastolic CHF, and colon polyp who presents for psychiatric evaluation given complex paranoid delusions of being at risk of death by neighbor who is threatening patient and son.   Ms Weigel admitted with substance induced mood disorder, delusional disorder, and rule out major neurocognitive disorder.   She is currently on a regular diet. No meal completion data available to assess at this time.  Reviewed weight history; she has experienced a 10.9% weight loss over the past 3 months, which is significant for time frame.   Labs reviewed. Tox screen negative.    73 y.o. female  Height: Ht Readings from Last 1 Encounters:  06/08/24 5' 5 (1.651 m)    Weight: Wt Readings from Last 1 Encounters:  06/08/24 48.1 kg    Weight Hx: Wt Readings from Last 10 Encounters:  06/08/24 48.1 kg  06/07/24 49 kg  06/03/24 52.6 kg  05/31/24 48.8 kg  05/12/24 49.8 kg  05/06/24 49.9 kg  05/02/24 50.3 kg  03/02/24 54 kg  02/18/24 53.5 kg  10/11/23 54.4 kg    BMI:  Body mass index is 17.64 kg/m. Pt meets criteria for underweight based on current BMI. Highly suspect malnutrition, however, unable to identify at this time.   Estimated Nutritional Needs: Kcal: 25-30 kcal/kg Protein: > 1 gram protein/kg Fluid: 1 ml/kcal  Diet Order:  Diet Order             Diet regular Room service appropriate? Yes; Fluid consistency: Thin  Diet effective now                  Pt is also offered  choice of unit snacks mid-morning and mid-afternoon.  Pt is eating as desired.   Lab results and medications reviewed.   Margery ORN, RD, LDN, CDCES Registered Dietitian III Certified Diabetes Care and Education Specialist If unable to reach this RD, please use RD Inpatient group chat on secure chat between hours of 8am-4 pm daily

## 2024-06-14 NOTE — Plan of Care (Signed)
  Problem: Education: Goal: Emotional status will improve Outcome: Progressing   Problem: Activity: Goal: Sleeping patterns will improve Outcome: Progressing   Problem: Coping: Goal: Ability to demonstrate self-control will improve Outcome: Progressing   Problem: Safety: Goal: Periods of time without injury will increase Outcome: Progressing   Problem: Self-Concept: Goal: Level of anxiety will decrease Outcome: Progressing

## 2024-06-14 NOTE — Progress Notes (Signed)
   06/14/24 2200  Psych Admission Type (Psych Patients Only)  Admission Status Voluntary  Psychosocial Assessment  Patient Complaints Anxiety  Eye Contact Fair  Facial Expression Animated  Affect Anxious  Speech Logical/coherent  Interaction Assertive  Motor Activity Tremors  Appearance/Hygiene In scrubs  Behavior Characteristics Cooperative  Mood Pleasant  Thought Process  Coherency WDL  Content Preoccupation  Delusions None reported or observed  Perception WDL  Hallucination None reported or observed  Judgment WDL  Confusion Mild  Danger to Self  Current suicidal ideation? Denies  Danger to Others  Danger to Others None reported or observed   Mood/Behavior:  Pleasant and cooperative.  Mild confusion.    Psych assessment: Denies SI/HI and AVH.     Interaction / Group attendance:  Present in the milieu. Minimal interaction with peers and staff.  Attended group.   Medication/ PRNs: Pt refused scheduled Trazodone reporting I did not like the way it made me feel. Pt given PRN Tylenol  for leg pain and noted effective.    Pain: 3/10 bilateral leg pain cramping pt required prn Tylenol  noted effective.   15 min checks in place for safety.

## 2024-06-14 NOTE — Plan of Care (Signed)
   Problem: Education: Goal: Knowledge of [facility] Education information/materials will improve Outcome: Progressing Goal: Verbalization of understanding the information provided will improve Outcome: Progressing

## 2024-06-14 NOTE — Progress Notes (Signed)
   06/14/24 0400  Psych Admission Type (Psych Patients Only)  Admission Status Voluntary  Psychosocial Assessment  Patient Complaints Anxiety  Eye Contact Fair  Facial Expression Animated  Affect Anxious  Speech Logical/coherent  Interaction Assertive  Motor Activity Other (Comment) (WNL)  Appearance/Hygiene Unremarkable  Behavior Characteristics Cooperative  Mood Pleasant  Thought Process  Coherency WDL  Content Preoccupation  Delusions None reported or observed  Perception WDL  Hallucination None reported or observed  Judgment WDL  Confusion Mild  Danger to Self  Current suicidal ideation? Denies  Danger to Others  Danger to Others None reported or observed

## 2024-06-14 NOTE — Progress Notes (Incomplete)
 Mood/Behavior:  Pleasant and cooperative.  Mild confusion.    Psych assessment: Denies SI/HI and AVH.     Interaction / Group attendance:  Present in the milieu. Minimal interaction with peers and staff.  Attended group.   Medication/ PRNs: Pt refused scheduled Trazodone reporting I did not like the way it made me feel. Pt given PRN Tylenol .    Pain: 3/10 bilateral leg pain cramping pt required prn Tylenol  noted effective.   15 min checks in place for safety.

## 2024-06-14 NOTE — Progress Notes (Signed)
   06/14/24 1500  Psych Admission Type (Psych Patients Only)  Admission Status Voluntary  Psychosocial Assessment  Patient Complaints Anxiety  Eye Contact Fair  Facial Expression Animated  Affect Anxious  Speech Logical/coherent  Interaction Assertive  Motor Activity Tremors  Appearance/Hygiene In scrubs  Behavior Characteristics Cooperative  Mood Pleasant  Thought Process  Coherency WDL  Content Preoccupation  Delusions None reported or observed  Perception WDL  Hallucination None reported or observed  Judgment WDL  Confusion Mild  Danger to Self  Current suicidal ideation? Denies  Danger to Others  Danger to Others None reported or observed

## 2024-06-14 NOTE — Group Note (Signed)
 Date:  06/14/2024 Time:  10:56 AM  Group Topic/Focus:  Self Care:   The focus of this group is to help patients understand the importance of self-care in order to improve or restore emotional, physical, spiritual, interpersonal, and financial health. We also discussed health eating habits and hygiene.     Participation Level:  Active  Participation Quality:  Appropriate  Affect:  Appropriate  Cognitive:  Appropriate  Insight: Appropriate  Engagement in Group:  Engaged  Modes of Intervention:  Discussion  Additional Comments:    Leigh VEAR Pais 06/14/2024, 10:56 AM

## 2024-06-15 DIAGNOSIS — F0390 Unspecified dementia without behavioral disturbance: Secondary | ICD-10-CM | POA: Diagnosis not present

## 2024-06-15 DIAGNOSIS — F1994 Other psychoactive substance use, unspecified with psychoactive substance-induced mood disorder: Secondary | ICD-10-CM | POA: Diagnosis not present

## 2024-06-15 DIAGNOSIS — F22 Delusional disorders: Secondary | ICD-10-CM | POA: Diagnosis not present

## 2024-06-15 NOTE — Progress Notes (Signed)
   06/15/24 1300  Psych Admission Type (Psych Patients Only)  Admission Status Voluntary  Psychosocial Assessment  Patient Complaints Anxiety  Eye Contact Fair  Facial Expression Animated  Affect Anxious  Speech Logical/coherent  Interaction Assertive  Motor Activity Tremors  Appearance/Hygiene In scrubs  Behavior Characteristics Cooperative  Mood Pleasant  Thought Process  Coherency WDL  Content Preoccupation  Delusions None reported or observed  Perception WDL  Hallucination None reported or observed  Judgment WDL  Confusion Mild  Danger to Self  Current suicidal ideation? Denies  Danger to Others  Danger to Others None reported or observed

## 2024-06-15 NOTE — Progress Notes (Signed)
   06/15/24 2100  Psych Admission Type (Psych Patients Only)  Admission Status Voluntary  Psychosocial Assessment  Patient Complaints Anxiety  Eye Contact Fair  Facial Expression Animated  Affect Anxious  Speech Logical/coherent  Interaction Assertive  Motor Activity Tremors  Appearance/Hygiene In scrubs  Behavior Characteristics Cooperative  Mood Pleasant  Thought Process  Coherency WDL  Content Preoccupation  Delusions None reported or observed  Perception WDL  Hallucination None reported or observed  Judgment WDL  Confusion Mild  Danger to Self  Current suicidal ideation? Denies  Danger to Others  Danger to Others None reported or observed   Mood/Behavior:  Pleasant and cooperative.  Mild confusion.    Psych assessment: Denies SI/HI and AVH.     Interaction / Group attendance:  Present in the milieu. Interacting with peers and staff.  Attended group.   Medication/ PRNs: Compliant with scheduled medications. Required PRNs Tylenol  for headache and noted effective.   Pain: Headache. Pt later complained of legs cramping and request cold compress noted effective.    15 min checks in place for safety.

## 2024-06-15 NOTE — Group Note (Signed)
 Date:  06/15/2024 Time:  8:31 PM  Group Topic/Focus:  Wrap-Up Group:   The focus of this group is to help patients review their daily goal of treatment and discuss progress on daily workbooks.    Participation Level:  Active  Participation Quality:  Appropriate  Affect:  Appropriate  Cognitive:  Appropriate  Insight: Appropriate  Engagement in Group:  Engaged  Modes of Intervention:  Discussion  Additional Comments:    Kelli Phillips 06/15/2024, 8:31 PM

## 2024-06-15 NOTE — Group Note (Signed)
 [facility] LCSW Group Therapy Note   Group Date: 06/15/2024 Start Time: 1315 End Time: 1400   Type of Therapy/Topic:  Group Therapy:  Emotion Regulation  Participation Level:  Active   Mood:  Description of Group:    The purpose of this group is to assist patients in learning to regulate negative emotions and experience positive emotions. Patients will be guided to discuss ways in which they have been vulnerable to their negative emotions. These vulnerabilities will be juxtaposed with experiences of positive emotions or situations, and patients challenged to use positive emotions to combat negative ones. Special emphasis will be placed on coping with negative emotions in conflict situations, and patients will process healthy conflict resolution skills.  Therapeutic Goals: Patient will identify two positive emotions or experiences to reflect on in order to balance out negative emotions:  Patient will label two or more emotions that they find the most difficult to experience:  Patient will be able to demonstrate positive conflict resolution skills through discussion or role plays:   Summary of Patient Progress:   Pt was appropriate and active throughout group    Therapeutic Modalities:   Cognitive Behavioral Therapy Feelings Identification Dialectical Behavioral Therapy   Kelli Phillips, LCSWA

## 2024-06-15 NOTE — Group Note (Signed)
 Recreation Therapy Group Note   Group Topic:Communication  Group Date: 06/15/2024 Start Time: 1100 End Time: 1130 Facilitators: Celestia Jeoffrey BRAVO, LRT, CTRS Location: Dayroom  Group Description: Emotional Check in. Patient sat and talked with LRT about how they are doing and whatever else is on their mind. LRT provided active listening, reassurance and encouragement. Pts were given the opportunity to listen to music or color mandalas while they talk.    Goal Area(s) Addressed: Patient will engage in conversation with LRT. Patient will communicate their wants, needs, or questions.  Patient will practice a new coping skill of "talking to someone".   Affect/Mood: Appropriate   Participation Level: Active and Engaged   Participation Quality: Independent   Behavior: Alert and Appropriate   Speech/Thought Process: Coherent   Insight: Good   Judgement: Good   Modes of Intervention: Open Conversation, Rapport Building, Socialization, and Support   Patient Response to Interventions:  Attentive, Engaged, Interested , and Receptive   Education Outcome:  Acknowledges education   Clinical Observations/Individualized Feedback: Kelli Phillips was active in their participation of session activities and group discussion. Pt identified that she was looking forward to going home tomorrow and seeing her dogs.    Plan: Continue to engage patient in RT group sessions 2-3x/week.   Jeoffrey BRAVO Celestia, LRT, CTRS 06/15/2024 1:24 PM

## 2024-06-15 NOTE — BHH Group Notes (Signed)
 Spirituality Group   Group Goal: Support / Education around grief and loss   Group Description: Following introductions and group rules, group members engaged in facilitated group dialog and support around topic of loss, with particular support around experiences of loss in their lives. Group members identified types of loss (relationships / self / things) as well as patterns, circumstances, and changes that precipitate loss. Reflection invited on thoughts / feelings around loss, normalized grief responses, and recognized variety in grief experience. Group noted Worden's four tasks of grief in discussion. Group drew on Adlerian / Rogerian, narrative, MI, with Yalom's group therapy as a primary framework.   Observations: Kelli Phillips was actively engaged in the group discussion.  [name] HERO.Div

## 2024-06-15 NOTE — Group Note (Signed)
 Recreation Therapy Group Note   Group Topic:Emotion Expression  Group Date: 06/15/2024 Start Time: 1400 End Time: 1450 Facilitators: Celestia Jeoffrey BRAVO, LRT, CTRS Location: Courtyard  Group Description: Music Reminisce. LRT encouraged patients to think of their favorite song(s) that reminded them of a positive memory or time in their life. LRT encouraged patient to talk about that memory aloud to the group. LRT played the song through a speaker for all to hear. LRT and patients discussed how thinking of a positive memory or time in their life can be used as a coping skill in everyday life post discharge.   Goal Area(s) Addressed: Patient will increase verbal communication by conversing with peers. Patient will contribute to group discussion with minimal prompting. Patient will reminisce a positive memory or moment in their life.    Affect/Mood: Appropriate   Participation Level: Active and Engaged   Participation Quality: Independent   Behavior: Appropriate, Calm, and Cooperative   Speech/Thought Process: Coherent   Insight: Good   Judgement: Good   Modes of Intervention: Music   Patient Response to Interventions:  Attentive, Engaged, and Receptive   Education Outcome:  Acknowledges education   Clinical Observations/Individualized Feedback: Chasta was active in their participation of session activities and group discussion. Pt identified I like country music.    Plan: Continue to engage patient in RT group sessions 2-3x/week.   Jeoffrey BRAVO Celestia, LRT, CTRS 06/15/2024 4:48 PM

## 2024-06-15 NOTE — Group Note (Signed)
 Date:  06/15/2024 Time:  10:55 AM  Group Topic/Focus:  Exercise Group:  The focus of the group is to encourage patients to watch a video which demonstrated different exercises to perform while in a seated position.  Patients were given an explanation on how each movements could benefit their physical and mental health in the long run.    Participation Level:  Active  Participation Quality:  Appropriate  Affect:  Appropriate  Cognitive:  Appropriate  Insight: Appropriate  Engagement in Group:  Engaged  Modes of Intervention:  Activity  Additional Comments:    Kelli HERO Rigdon Phillips 06/15/2024, 10:55 AM

## 2024-06-15 NOTE — Plan of Care (Signed)
°  Problem: Education: Goal: Knowledge of [facility] Education information/materials will improve Outcome: Progressing Goal: Emotional status will improve Outcome: Progressing Goal: Mental status will improve Outcome: Progressing Goal: Verbalization of understanding the information provided will improve Outcome: Progressing   Problem: Activity: Goal: Interest or engagement in activities will improve Outcome: Progressing Goal: Sleeping patterns will improve Outcome: Progressing   Problem: Coping: Goal: Ability to verbalize frustrations and anger appropriately will improve Outcome: Progressing Goal: Ability to demonstrate self-control will improve Outcome: Progressing   Problem: Health Behavior/Discharge Planning: Goal: Identification of resources available to assist in meeting health care needs will improve Outcome: Progressing Goal: Compliance with treatment plan for underlying cause of condition will improve Outcome: Progressing   Problem: Physical Regulation: Goal: Ability to maintain clinical measurements within normal limits will improve Outcome: Progressing   Problem: Safety: Goal: Periods of time without injury will increase Outcome: Progressing   Problem: Education: Goal: Ability to state activities that reduce stress will improve Outcome: Progressing   Problem: Coping: Goal: Ability to identify and develop effective coping behavior will improve Outcome: Progressing   Problem: Self-Concept: Goal: Ability to identify factors that promote anxiety will improve Outcome: Progressing Goal: Level of anxiety will decrease Outcome: Progressing Goal: Ability to modify response to factors that promote anxiety will improve Outcome: Progressing   Problem: Education: Goal: Ability to state activities that reduce stress will improve Outcome: Progressing   Problem: Coping: Goal: Ability to identify and develop effective coping behavior will improve Outcome:  Progressing   Problem: Self-Concept: Goal: Ability to identify factors that promote anxiety will improve Outcome: Progressing Goal: Level of anxiety will decrease Outcome: Progressing Goal: Ability to modify response to factors that promote anxiety will improve Outcome: Progressing
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
## 2024-06-16 ENCOUNTER — Other Ambulatory Visit: Payer: Self-pay

## 2024-06-16 DIAGNOSIS — F1994 Other psychoactive substance use, unspecified with psychoactive substance-induced mood disorder: Secondary | ICD-10-CM | POA: Diagnosis not present

## 2024-06-16 MED ORDER — ARIPIPRAZOLE 10 MG PO TABS
10.0000 mg | ORAL_TABLET | Freq: Every day | ORAL | 0 refills | Status: AC
  Filled 2024-06-16: qty 30, 30d supply, fill #0

## 2024-06-16 MED ORDER — TRAZODONE HCL 100 MG PO TABS
100.0000 mg | ORAL_TABLET | Freq: Every day | ORAL | 0 refills | Status: DC
  Filled 2024-06-16: qty 30, 30d supply, fill #0

## 2024-06-16 MED ORDER — ADULT MULTIVITAMIN W/MINERALS CH
1.0000 | ORAL_TABLET | Freq: Every day | ORAL | 0 refills | Status: AC
  Filled 2024-06-16: qty 30, 30d supply, fill #0

## 2024-06-16 NOTE — Group Note (Signed)
 Date:  06/16/2024 Time:  11:27 AM  Group Topic/Focus:  Diagnosis Education:   The focus of this group is to discuss the major disorders that patients maybe diagnosed with.  Group discusses the importance of knowing what one's diagnosis is so that one can understand treatment and better advocate for oneself. Emotional Education:   The focus of this group is to discuss what feelings/emotions are, and how they are experienced. Managing Feelings:   The focus of this group is to identify what feelings patients have difficulty handling and develop a plan to handle them in a healthier way upon discharge.    Participation Level:  Active  Participation Quality:  Appropriate  Affect:  Appropriate  Cognitive:  Alert  Insight: Appropriate  Engagement in Group:  Engaged  Modes of Intervention:  Activity and Discussion  Additional Comments:  N/A  Harlene LITTIE Gavel 06/16/2024, 11:27 AM

## 2024-06-16 NOTE — Group Note (Signed)
 Physical/Occupational Therapy Group Note  Group Topic: UE Therex   Group Date: 06/16/2024 Start Time: 1305 End Time: 1350 Facilitators: Clive Warren CROME, OT    Group Description: Group instructed in series of upper extremities exercises, aimed to promote strength, flexibility, range of motion and functional endurance.  Patients provided cuing for proper mechanics and proper pace of exercise; exercises adjusted as necessary for individualized patient needs.  Patient also engaged in cognitive components throughout session, working to integrate attention to task, command following, turn-taking and appropriate social interaction throughout session.  Allowed to ask questions as appropriate, and encouraged to identify specific exercises that they could complete independently outside of group sessions.   Therapeutic Goal(s): Demonstrate appropriate performance of upper extremity exercises to promote strength, flexibility, range of motion and functional endurance Identify 2-3 specific upper extremity exercises to complete as home exercise program outside of group session   Individual Participation:  Pt eager to join despite endorsing discharging soon. Pt actively engaged throughout session, requiring MIN-MOD multimodal cues for technique. Participated in group discussion with and without prompting. Pt appreciative.   Participation Level: Active and Engaged   Participation Quality: Minimal Cues and Moderate Cues   Behavior: Alert, Appropriate, Attentive , and Eager   Speech/Thought Process: Coherent and Relevant   Affect/Mood: Appropriate and Happy   Insight: Fair   Judgement: Fair   Modes of Intervention: Activity, Clarification, Discussion, Education, Exploration, Problem-solving, Rapport Building, Socialization, and Support  Patient Response to Interventions:  Attentive, Engaged, Interested , and Receptive   Plan: Continue to engage patient in PT/OT groups 1 - 2x/week.  Nakema Fake R., MPH,  MS, OTR/L ascom [phone] 06/16/24, 2:24 PM

## 2024-06-16 NOTE — Progress Notes (Signed)
 Patient was discharged from [facility] unit escorted by staff. Patient denies SI/HI/AVH. Discharge packet to include printed AVS, Suicide Risk Assessment, Transition Record, and discharge medications all reviewed with patient. Belongings to include purse, eyeglasses, and 2 inhalers returned. Suicide Safety Plan completed with a copy kept in chart.

## 2024-06-16 NOTE — Progress Notes (Signed)
  [facility] Adult Case Management Discharge Plan :  Will you be returning to the same living situation after discharge:  Yes,  pt will return home At discharge, do you have transportation home?: Yes,  CSW will assist with transportation Do you have the ability to pay for your medications: Yes,   MEDICARE / MEDICARE PART A AND B  Release of information consent forms completed and in the chart;  Patient's signature needed at discharge.  Patient to Follow up at:  Follow-up Information     [facility] on 06/22/2024.   Why: Your appointment is scheduled for 06/22/24 at 11:30am and it will be a VIRTUAL appointment. Contact information: [address]  [address] [place] [address] [phone]                 Next level of care provider has access to [facility] Link:no  Safety Planning and Suicide Prevention discussed: Yes,  SPE gone over with pt at discharg3     Has patient been referred to the Quitline?: Patient refused referral for treatment  Patient has been referred for addiction treatment: No known substance use disorder.  [address] LCSWA 06/16/2024, 1:17 PM

## 2024-06-16 NOTE — [facility]HH Suicide Risk Assessment (Signed)
 [facility] Discharge Suicide Risk Assessment   Principal Problem: Substance induced mood disorder (HCC) Discharge Diagnoses: Principal Problem:   Substance induced mood disorder (HCC)   Total Time spent with patient: 30 minutes  Musculoskeletal: Strength & Muscle Tone: within normal limits Gait & Station: normal Patient leans: N/A  Psychiatric Specialty Exam  Presentation  General Appearance:  Appropriate for Environment; Casual  Eye Contact: Fair  Speech: Normal Rate  Speech Volume: Normal  Handedness: Right   Mood and Affect  Mood: Euthymic  Duration of Depression Symptoms: Greater than two weeks  Affect: Appropriate   Thought Process  Thought Processes: Coherent  Descriptions of Associations:Intact  Orientation:Full (Time, Place and Person)  Thought Content:Logical  History of Schizophrenia/Schizoaffective disorder:No  Duration of Psychotic Symptoms:Less than six months  Hallucinations:Hallucinations: None  Ideas of Reference:None  Suicidal Thoughts:Suicidal Thoughts: No  Homicidal Thoughts:Homicidal Thoughts: No   Sensorium  Memory: Immediate Fair; Recent Fair  Judgment: Fair  Insight: Fair   Art therapist  Concentration: Fair  Attention Span: Fair  Recall: [employer] of Knowledge: Fair  Language: Fair   Psychomotor Activity  Psychomotor Activity: Psychomotor Activity: Normal   Assets  Assets: Communication Skills; Housing   Sleep  Sleep: Sleep: Fair  Estimated Sleeping Duration (Last 24 Hours): 8.25-9.25 hours  Physical Exam: Physical Exam Vitals and nursing note reviewed.    ROS Blood pressure 121/71, pulse 84, temperature (!) 97.3 F (36.3 C), resp. rate 17, height 5' 5 (1.651 m), weight 48.1 kg, SpO2 95%. Body mass index is 17.64 kg/m.  Mental Status Per Nursing Assessment::   On Admission:  NA  Demographic Factors:  Low socioeconomic status  Loss Factors: Decrease in vocational  status  Historical Factors: Impulsivity  Risk Reduction Factors:   Living with another person, especially a relative, Positive social support, Positive therapeutic relationship, and Positive coping skills or problem solving skills  Continued Clinical Symptoms:  Currently Psychotic  Cognitive Features That Contribute To Risk:  None    Suicide Risk:  Minimal: No identifiable suicidal ideation.  Patients presenting with no risk factors but with morbid ruminations; may be classified as minimal risk based on the severity of the depressive symptoms   Follow-up Information     [facility] on 06/22/2024.   Why: Your appointment is scheduled for 06/22/24 at 11:30am and it will be a VIRTUAL appointment. Contact information: [address]  [address] [place] [address] [phone]                 Plan Of Care/Follow-up recommendations:  Activity:  as tolerated  Allyn Foil, MD 06/16/2024, 8:17 AM

## 2024-06-16 NOTE — Care Management Important Message (Signed)
 Important Message  Patient Details  Name: Kelli Phillips MRN: [id number] Date of Birth: 1950-11-12   Important Message Given:  Yes - Medicare IM     Lum JONETTA Croft, LCSWA 06/16/2024, 9:16 AM
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
## 2024-06-19 ENCOUNTER — Other Ambulatory Visit: Payer: Self-pay

## 2024-06-19 ENCOUNTER — Emergency Department
Admission: EM | Admit: 2024-06-19 | Discharge: 2024-06-19 | Disposition: A | Discharge status: Home / Self Care | Attending: Emergency Medicine | Admitting: Emergency Medicine

## 2024-06-19 ENCOUNTER — Telehealth: Payer: Self-pay

## 2024-06-19 DIAGNOSIS — J449 Chronic obstructive pulmonary disease, unspecified: Secondary | ICD-10-CM | POA: Insufficient documentation

## 2024-06-19 DIAGNOSIS — M545 Low back pain, unspecified: Secondary | ICD-10-CM | POA: Insufficient documentation

## 2024-06-19 DIAGNOSIS — G8929 Other chronic pain: Secondary | ICD-10-CM | POA: Diagnosis not present

## 2024-06-19 MED ORDER — LIDOCAINE 5 % EX PTCH
2.0000 | MEDICATED_PATCH | Freq: Once | CUTANEOUS | Status: DC
  Administered 2024-06-19: 2 via TRANSDERMAL
  Filled 2024-06-19: qty 2

## 2024-06-19 MED ORDER — DIAZEPAM 2 MG PO TABS
2.0000 mg | ORAL_TABLET | Freq: Once | ORAL | Status: AC
  Administered 2024-06-19: 2 mg via ORAL
  Filled 2024-06-19: qty 1

## 2024-06-19 MED ORDER — LIDOCAINE 5 % EX PTCH: 1.0000 | MEDICATED_PATCH | Freq: Two times a day (BID) | CUTANEOUS | 1 refills | Status: DC

## 2024-06-19 MED ORDER — NAPROXEN 500 MG PO TABS
500.0000 mg | ORAL_TABLET | Freq: Once | ORAL | Status: AC
  Administered 2024-06-19: 500 mg via ORAL
  Filled 2024-06-19: qty 1

## 2024-06-19 MED ORDER — ACETAMINOPHEN 500 MG PO TABS
1000.0000 mg | ORAL_TABLET | Freq: Once | ORAL | Status: AC
  Administered 2024-06-19: 1000 mg via ORAL
  Filled 2024-06-19: qty 2

## 2024-06-19 NOTE — ED Triage Notes (Signed)
 Pt arrives via EMS from home for c/o mid upper back pain
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
## 2024-06-19 NOTE — ED Triage Notes (Signed)
 Pt arrives EMS c/o mid/upper back pain since being discharged from hospital on Friday, she was admitted for pneumonia. Pt states advil  not helping, last dose 1 hr pta. Pt states she feels like she has a knot if left upper neck under jaw x months. She states when she yawns, it cramps up. Denies CP or worsening sob since Friday. NADN.

## 2024-06-19 NOTE — Transitions of Care (Post Inpatient/ED Visit) (Signed)
 06/19/2024  Name: Kelli Phillips MRN: [id number] DOB: December 08, 1950  Today's TOC FU Call Status: Today's TOC FU Call Status:: Successful TOC FU Call Completed TOC FU Call Complete Date: 06/19/24 Patient's Name and Date of Birth confirmed.  Transition Care Management Follow-up Telephone Call Date of Discharge: 06/16/24 Discharge Facility: [facility]) Type of Discharge: Inpatient Admission Primary Inpatient Discharge Diagnosis:: Substance Induced Mood Disorder How have you been since you were released from the hospital?: Better Any questions or concerns?: No  Items Reviewed: Did you receive and understand the discharge instructions provided?: Yes Any new allergies since your discharge?: No Do you have support at home?: Yes People in Home [RPT]: child(ren), dependent Name of Support/Comfort Primary Source: Kelli Phillips, my son, I really take care of him  Medications Reviewed Today: Medications Reviewed Today     Reviewed by Eilleen Richerd GRADE, RN (Registered Nurse) on 06/19/24 at [phone]  Med List Status: <None>   Medication Order Taking? Sig Documenting Provider Last Dose Status Informant  albuterol  (VENTOLIN  HFA) 108 (90 Base) MCG/ACT inhaler [id number] Yes Inhale 2 puffs into the lungs every 6 (six) hours as needed for wheezing or shortness of breath. Trudy Anthony HERO, MD  Active Pharmacy Records  ARIPiprazole (ABILIFY) 10 MG tablet [id number] Yes Take 1 tablet (10 mg total) by mouth daily. Jadapalle, Sree, MD  Active   cyanocobalamin  (VITAMIN B12) 1000 MCG tablet [id number] Yes Take 2 tablets (2,000 mcg total) by mouth daily. Hope Merle, MD  Active Pharmacy Records  Glycopyrrolate -Formoterol  (BEVESPI  AEROSPHERE) 9-4.8 MCG/ACT TERESE [id number] Yes INHALE 2 PUFFS INTO THE LUNGS TWICE A DAY Dgayli, Khabib, MD  Active Pharmacy Records  lidocaine  (LIDODERM ) 5 % [id number]  Place 1 patch onto the skin daily. Remove & Discard patch within 12 hours or as directed by MD   Patient not taking: Reported on 06/07/2024   Trudy Anthony HERO, MD  Active Pharmacy Records           Med Note [place], RICHERD GRADE Kitchens Jun 05, 2024  9:21 AM) Insurance would not cover this dose she said would cost her $300  Multiple Vitamin (MULTIVITAMIN WITH MINERALS) TABS tablet [id number] Yes Take 1 tablet by mouth daily. Jadapalle, Sree, MD  Active   traZODone (DESYREL) 100 MG tablet [id number]  Take 1 tablet (100 mg total) by mouth at bedtime. Donnelly Mellow, MD  Active            Med Note LESLY, RICHERD GRADE Kitchens Jun 19, 2024  9:47 AM) Has it and took 1 but doesn't like it            Home Care and Equipment/Supplies: Were Home Health Services Ordered?: No Any new equipment or medical supplies ordered?: No  Functional Questionnaire: Do you need assistance with bathing/showering or dressing?: No Do you need assistance with meal preparation?: No Do you need assistance with eating?: No Do you have difficulty maintaining continence: No Do you need assistance with getting out of bed/getting out of a chair/moving?: No Do you have difficulty managing or taking your medications?: No  Follow up appointments reviewed: PCP Follow-up appointment confirmed?: Yes (This writer booked a follow up with PCP) Date of PCP follow-up appointment?: 06/28/24 Follow-up Provider: Lauraine Mocha, DO Specialist Hospital Follow-up appointment confirmed?: Yes Date of Specialist follow-up appointment?: 06/22/24 Follow-Up Specialty Provider:: Merrily - virtual appointment Do you need transportation to your follow-up appointment?: No (Patient will call for appointment) Do you understand care options if your  condition(s) worsen?: Yes-patient verbalized understanding  SDOH Interventions Today    Flowsheet Row Most Recent Value  SDOH Interventions   Food Insecurity Interventions Intervention Not Indicated  Housing Interventions Intervention Not Indicated  [I am renting here for years ut I want to  move because this area is getting bad]  Transportation Interventions Intervention Not Indicated  [I use the bus or insurance (Medicaid) transportation]  Utilities Interventions AMB Referral  [follow up referral - patient states I've been in the hospital and trying to catch up my bills]   Discussed and offered 30 day TOC program.  Patient desires to restart [facility] program.  The patient has been provided with contact information for the care management team and has been advised to call with any health -related questions or concerns.  The patient verbalized understanding with current plan of care.  The patient is directed to their insurance card regarding availability of benefits coverage.    Richerd Fish, RN, BSN, CCM [facility], [facility] RN Care Manager Direct Dial: [phone]

## 2024-06-19 NOTE — Discharge Instructions (Addendum)
Use Tylenol for pain and fevers.  Up to 1000 mg per dose, up to 4 times per day.  Do not take more than 4000 mg of Tylenol/acetaminophen within 24 hours..  Use naproxen/Aleve for anti-inflammatory pain relief. Use up to 500mg  every 12 hours. Do not take more frequently than this. Do not use other NSAIDs (ibuprofen, Advil) while taking this medication. It is safe to take Tylenol with this.   Please use lidocaine patches at your site of pain.  Apply 1 patch at a time, leave on for 12 hours, then remove for 12 hours.  12 hours on, 12 hours off.  Do not apply more than 1 patch at a time.
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
## 2024-06-20 ENCOUNTER — Telehealth: Payer: Self-pay | Admitting: Licensed Clinical Social Worker

## 2024-06-20 NOTE — Telephone Encounter (Signed)
 H&V Care Navigation CSW Progress Note  Clinical Social Worker contacted patient by phone to f/u after IP [facility] stay and ED visit. Was able to reach her at [phone]. Re-introduced self, role, reason for call. Pt shares she is doing well since hospital stay. She feels okay at home, has been cleaning and catching up on things since d/c. She has access to medications and food, feels okay managing at home right now. Pt had been sent resources for alternate housing as interested. Encouraged her to call HF clinic as needed with any additional/questions concerns; non voiced today when I ask, she is very appreciative of all care inpatient and outpatient.  Patient is participating in a Managed Medicaid Plan:  No, Medicare and Medicaid  SDOH Screenings   Food Insecurity: No Food Insecurity (06/19/2024)  Recent Concern: Food Insecurity - Food Insecurity Present (05/31/2024)  Housing: Low Risk  (06/19/2024)  Transportation Needs: No Transportation Needs (06/19/2024)  Utilities: Not At Risk (06/19/2024)  Alcohol Screen: Low Risk  (06/08/2024)  Depression (PHQ2-9): Low Risk  (06/19/2024)  Financial Resource Strain: Medium Risk (05/31/2024)  Physical Activity: Insufficiently Active (02/18/2024)  Social Connections: Moderately Integrated (06/08/2024)  Recent Concern: Social Connections - Moderately Isolated (05/31/2024)  Stress: No Stress Concern Present (02/18/2024)  Tobacco Use: Medium Risk (06/08/2024)  Health Literacy: Inadequate Health Literacy (05/31/2024)    Marit Lark, MSW, LCSW Clinical Social Worker II [facility] Health Heart/Vascular Care Navigation  [phone]- work cell phone (preferred)

## 2024-06-26 ENCOUNTER — Telehealth: Payer: Self-pay | Admitting: Licensed Clinical Social Worker

## 2024-06-26 ENCOUNTER — Telehealth: Payer: Self-pay

## 2024-06-26 ENCOUNTER — Ambulatory Visit: Admitting: Nurse Practitioner

## 2024-06-26 NOTE — Telephone Encounter (Signed)
 H&V Care Navigation CSW Progress Note  Clinical Social Worker contacted patient by phone to f/u on a call from her son from home number inquiring about lost wallet (son's wallet) when she was in the hospital. I was able to reach pt at [phone]. Inquired if her son was available and let her know I had received a call from home phone about lost item. She was able to have her son Alm come to phone, I clarified to ask about missing items at [facility] he needs to call [phone] and ask to speak with security team. Pt son has number, asked if I was a child psychotherapist if I could look up another persons information, I shared with him that I cannot assist him with that. No additional questions.  Patient is participating in a Managed Medicaid Plan:  No, Medicare   SDOH Screenings   Food Insecurity: No Food Insecurity (06/19/2024)  Recent Concern: Food Insecurity - Food Insecurity Present (05/31/2024)  Housing: Low Risk  (06/19/2024)  Transportation Needs: No Transportation Needs (06/19/2024)  Utilities: Not At Risk (06/19/2024)  Alcohol Screen: Low Risk  (06/08/2024)  Depression (PHQ2-9): Low Risk  (06/19/2024)  Financial Resource Strain: Medium Risk (05/31/2024)  Physical Activity: Insufficiently Active (02/18/2024)  Social Connections: Moderately Integrated (06/08/2024)  Recent Concern: Social Connections - Moderately Isolated (05/31/2024)  Stress: No Stress Concern Present (02/18/2024)  Tobacco Use: Medium Risk (06/08/2024)  Health Literacy: Inadequate Health Literacy (05/31/2024)     Marit Lark, MSW, LCSW Clinical Social Worker II [facility] Health Heart/Vascular Care Navigation  [phone]- work cell phone (preferred)

## 2024-06-26 NOTE — Telephone Encounter (Signed)
 Copied from CRM #[id number]. Topic: General - Transportation >> Jun 26, 2024  1:48 PM Charlet HERO wrote: Reason for CRM: Patient wants transportation for the doctors [address] for her ride to her appointment.  [phone] [employer].

## 2024-06-27 ENCOUNTER — Other Ambulatory Visit: Admitting: Licensed Clinical Social Worker

## 2024-06-27 ENCOUNTER — Telehealth: Payer: Self-pay

## 2024-06-27 ENCOUNTER — Other Ambulatory Visit: Payer: Self-pay

## 2024-06-27 DIAGNOSIS — F1994 Other psychoactive substance use, unspecified with psychoactive substance-induced mood disorder: Secondary | ICD-10-CM

## 2024-06-27 DIAGNOSIS — G4734 Idiopathic sleep related nonobstructive alveolar hypoventilation: Secondary | ICD-10-CM

## 2024-06-27 DIAGNOSIS — J9601 Acute respiratory failure with hypoxia: Secondary | ICD-10-CM

## 2024-06-27 DIAGNOSIS — J449 Chronic obstructive pulmonary disease, unspecified: Secondary | ICD-10-CM
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
## 2024-06-28 ENCOUNTER — Ambulatory Visit (INDEPENDENT_AMBULATORY_CARE_PROVIDER_SITE_OTHER): Admitting: Family Medicine

## 2024-06-28 ENCOUNTER — Encounter: Payer: Self-pay | Admitting: Family Medicine

## 2024-06-28 ENCOUNTER — Telehealth: Payer: Self-pay

## 2024-06-28 VITALS — BP 138/68 | HR 83 | Temp 97.9°F | Ht 65.0 in | Wt 105.3 lb

## 2024-06-28 DIAGNOSIS — J9611 Chronic respiratory failure with hypoxia: Secondary | ICD-10-CM | POA: Diagnosis not present

## 2024-06-28 DIAGNOSIS — F22 Delusional disorders: Secondary | ICD-10-CM

## 2024-06-28 DIAGNOSIS — J432 Centrilobular emphysema: Secondary | ICD-10-CM

## 2024-06-28 DIAGNOSIS — Z1211 Encounter for screening for malignant neoplasm of colon: Secondary | ICD-10-CM

## 2024-06-28 DIAGNOSIS — Z789 Other specified health status: Secondary | ICD-10-CM

## 2024-06-28 DIAGNOSIS — I5032 Chronic diastolic (congestive) heart failure: Secondary | ICD-10-CM | POA: Diagnosis not present

## 2024-06-28 DIAGNOSIS — F411 Generalized anxiety disorder: Secondary | ICD-10-CM

## 2024-06-28 DIAGNOSIS — Z1212 Encounter for screening for malignant neoplasm of rectum: Secondary | ICD-10-CM

## 2024-06-28 MED ORDER — PULSE OXIMETER MISC: 1.0000 | Freq: Once | 0 refills | Status: AC

## 2024-06-28 MED ORDER — BLOOD PRESSURE KIT KIT
1.0000 | PACK | Freq: Once | 0 refills | Status: AC
Start: 2024-06-28 — End: 2024-06-28

## 2024-06-28 NOTE — Telephone Encounter (Signed)
 Called patient yesterday and she stated that she wanted us  to call [employer] to get her to her appointment for today.  I called [employer] and they stated that she has medicaid so she would need to contact them.  Called patient today to relay what was said, she stated that she doesn't understand what this is about and she is the one that handles [employer] and didn't want anyone messing with it.  Patient seemed confused.
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
## 2024-06-28 NOTE — Patient Instructions (Signed)
 Visit Information  Thank you for taking time to visit with me today. Please don't hesitate to contact me if I can be of assistance to you before our next scheduled appointment.  Our next appointment is by telephone on 06/29/2024 at 9:30 am Please call the care guide team at [phone] if you need to cancel or reschedule your appointment.   Following is a copy of your care plan:   Goals Addressed             This Visit's Progress    BSW VBCI Social Work Care Plan       Problems:   [name]  and medical equipment for oxygen  machine  CSW Clinical Goal(s):   Over the next 2 days the Patient will will follow up with [employer] and PCP about order for oxygen  supplies as directed by Social Work.  Interventions:  SW will wait to hear back form patient about payment plan with [employer] a nd PCP about oxygen  supply order  Patient Goals/Self-Care Activities:  Coordinate with [employer]  to assist with payment plans for disconnection notice. And PCP order  Plan:   Telephone follow up appointment with care management team member scheduled for:  06/29/2024 at 9:30 am        Please call the Suicide and Crisis Lifeline: 988 go to [facility]gent [facility] [address], [place] [phone]) call 911 if you are experiencing a Mental Health or Behavioral Health Crisis or need someone to talk to.  Patient verbalized understanding of Care plan and visit instructions communicated this visit  Tobias CHARM Maranda HEDWIG, PhD [facility], [facility] Social Worker Direct Dial: [phone]  Fax: [phone]

## 2024-06-28 NOTE — Patient Outreach (Signed)
 Social Drivers of Health  Community Resource and Care Coordination Visit Note   06/28/2024  Name: Kelli Phillips MRN: [id number] DOB:Sep 12, 1950  Situation: Referral received for [facility] needs assessment and assistance related to Financial Strain . I obtained verbal consent from Patient.  Visit completed with Patient on the phone.   Background:      Assessment:   Goals Addressed             This Visit's Progress    BSW VBCI Social Work Care Plan       Problems:   [name]  and medical equipment for oxygen  machine  CSW Clinical Goal(s):   Over the next 2 days the Patient will will follow up with [employer] and PCP about order for oxygen  supplies as directed by Social Work.  Interventions:  SW will wait to hear back form patient about payment plan with [employer] a nd PCP about oxygen  supply order  Patient Goals/Self-Care Activities:  Coordinate with [employer]  to assist with payment plans for disconnection notice. And PCP order  Plan:   Telephone follow up appointment with care management team member scheduled for:  06/29/2024 at 9:30 am        Recommendation:   Follow up with [employer] today about disconnection notice and to get a payment arrangement   Follow Up Plan:   Telephone follow up appointment date/time:  06/29/2024 at 9:30 am  Kelli Phillips Kelli HEDWIG, PhD [facility], [facility] Social Worker Direct Dial: [phone]  Fax: [phone]

## 2024-06-28 NOTE — Transitions of Care (Post Inpatient/ED Visit) (Signed)
 06/28/2024  Patient ID: Kelli Phillips, female   DOB: 1950/09/29, 72 y.o.   MRN: [id number]  Patient called about light bill and [employer] to put information in computer and the note that the she is on oxygen . Patient states she is following up with her pastor who her son said would try to assist.  Encouraged patient to follow up.  She states she did follow up with the social worker who told her the same thing.  She states, I don't think that pastor likes me but does talk with my son. Patient verbalized understanding.  Richerd Fish, RN, BSN, CCM [facility], [facility] Management Coordinator Direct Dial: [phone]

## 2024-06-28 NOTE — Assessment & Plan Note (Signed)
 Continue oxygen  2 L at bedtime and with activity as needed.  Goal is to keep O2 saturations >88 up to 90%

## 2024-06-29 ENCOUNTER — Other Ambulatory Visit: Payer: Self-pay | Admitting: Licensed Clinical Social Worker

## 2024-06-29 NOTE — Patient Outreach (Signed)
 Social Drivers of Health  Community Resource and Care Coordination Visit Note   06/29/2024  Name: Kelli Phillips MRN: [id number] DOB:11-07-1950  Situation: Referral received for [facility] needs assessment and assistance related to Financial Strain  Utilities resources. I obtained verbal consent from Patient.  Visit completed with Patient on the phone.   Background:   SDOH Interventions Today    Flowsheet Row Most Recent Value  SDOH Interventions   Food Insecurity Interventions Intervention Not Indicated  Housing Interventions Community Resources Provided  [SW will mail some housing resources patine wants to move]  Utilities Interventions Community Resources Provided  [SW will mail a list of resources for utility assistance]     Assessment:   Goals Addressed             This Visit's Progress    BSW VBCI Social Work Care Plan       Problems:   [name]  and medical equipment for oxygen  machine  CSW Clinical Goal(s):   Over the next 2 days the Patient will will follow up with [employer] and PCP about order for oxygen  supplies as directed by Social Work.  Interventions:  SW will wait to hear back form patient about payment plan with [employer] a nd PCP about oxygen  supply order  Patient Goals/Self-Care Activities:  Coordinate with [employer]  to assist with payment plans for disconnection notice. And PCP order  Plan:   Telephone follow up appointment with care management team member scheduled for:  07/13/2024 at 10:00 am        Recommendation:   Review the utilities resources for assistance  Follow Up Plan:   Telephone follow up appointment date/time:  07/13/2024 at 10:00 am  Kelli CHARM Maranda HEDWIG, PhD [facility], [facility] Social Worker Direct Dial: [phone]  Fax: [phone]

## 2024-06-29 NOTE — Patient Instructions (Signed)
 Visit Information  Thank you for taking time to visit with me today. Please don't hesitate to contact me if I can be of assistance to you before our next scheduled appointment.  Your next care management appointment is by telephone on 07/13/2024 at 10:00 am   Please call the care guide team at [phone] if you need to cancel, schedule, or reschedule an appointment.   Please call the Suicide and Crisis Lifeline: 988 go to [facility] Urgent [facility] [address], [place] [phone]) call 911 if you are experiencing a Mental Health or Behavioral Health Crisis or need someone to talk to.  Tobias CHARM Maranda HEDWIG, PhD [facility], [facility] Social Worker Direct Dial: [phone]  Fax: [phone]

## 2024-07-03 ENCOUNTER — Ambulatory Visit: Payer: Self-pay

## 2024-07-03 NOTE — Telephone Encounter (Signed)
 FYI Only or Action Required?: FYI only for provider: appointment scheduled on 11/4.  Patient was last seen in primary care on 06/28/2024 by Donzella Lauraine SAILOR, DO.  Called Nurse Triage reporting Back Pain.  Symptoms began x 2 years .  Interventions attempted: Rest, hydration, or home remedies.  Symptoms are: unchanged.  Triage Disposition: See PCP When Office is Open (Within 3 Days)  Patient/caregiver understands and will follow disposition?: Yes                          Copied from CRM #[id number]. Topic: Clinical - Red Word Triage >> Jul 03, 2024 12:15 PM Lonell PEDLAR wrote: Red Word that prompted transfer to Nurse Triage: Patient called stating she has really bad low back pain Reason for Disposition  [1] MODERATE back pain (e.g., interferes with normal activities) AND [2] present > 3 days  Answer Assessment - Initial Assessment Questions 1. ONSET: When did the pain begin? (e.g., minutes, hours, days)     X 2 years   2. LOCATION: Where does it hurt? (upper, mid or lower back)     Mid back   3. SEVERITY: How bad is the pain?  (e.g., Scale 1-10; mild, moderate, or severe)     3/10  4. PATTERN: Is the pain constant? (e.g., yes, no; constant, intermittent)      Intermittent   5. RADIATION: Does the pain shoot into your legs or somewhere else? No       6. CAUSE:  What do you think is causing the back pain?      Patient stated she has osteoporosis   7. BACK OVERUSE:  Any recent lifting of heavy objects, strenuous work or exercise?     No   8. MEDICINES: What have you taken so far for the pain? (e.g., nothing, acetaminophen , NSAIDS)     Nothing   9. NEUROLOGIC SYMPTOMS: Do you have any weakness, numbness, or problems with bowel/bladder control?     No   10. OTHER SYMPTOMS: Do you have any other symptoms? (e.g., fever, abdomen pain, burning with urination, blood in urine)  No   Patient has 11/4 appt. Scheduled.  Home care advice given in  the meantime for the pain.  Protocols used: Back Pain-A-AH

## 2024-07-04 ENCOUNTER — Ambulatory Visit: Admitting: Family Medicine

## 2024-07-04 ENCOUNTER — Encounter: Payer: Self-pay | Admitting: Family Medicine

## 2024-07-04 VITALS — BP 153/85 | HR 79 | Temp 97.6°F | Ht 65.0 in | Wt 105.6 lb

## 2024-07-04 DIAGNOSIS — M5440 Lumbago with sciatica, unspecified side: Secondary | ICD-10-CM | POA: Diagnosis not present

## 2024-07-04 DIAGNOSIS — J449 Chronic obstructive pulmonary disease, unspecified: Secondary | ICD-10-CM | POA: Diagnosis not present

## 2024-07-04 DIAGNOSIS — G8929 Other chronic pain: Secondary | ICD-10-CM

## 2024-07-04 DIAGNOSIS — J432 Centrilobular emphysema: Secondary | ICD-10-CM | POA: Diagnosis not present

## 2024-07-04 DIAGNOSIS — F411 Generalized anxiety disorder: Secondary | ICD-10-CM

## 2024-07-04 NOTE — Progress Notes (Signed)
 -     Established patient visit   Patient: Kelli Phillips   DOB: June 12, 1951   73 y.o. Female  MRN: [id number] Visit Date: 07/04/2024  Today's healthcare provider: LAURAINE LOISE BUOY, DO   Chief Complaint  Patient presents with   Acute Visit    Patient called stating she has really bad low back pain and it has been going on for years.  Starts in the middle of her back and radiates down to lower back.  States that she has ran out of her medication.   Subjective    HPI Kelli Phillips is a 73 year old female with chronic back pain and COPD who presents with worsening back pain.  She experiences ongoing back pain that worsens at night, particularly when lying on her soft bed. The pain is alleviated when sitting in a certain position. She uses BC powder for pain relief, which causes gagging, and is currently out of her recently prescribed pain medication (a short course of oxycodone ). Her son has taken some of her pain medication for his migraines.  She has a history of COPD and emphysema, with a recent exacerbation for which she was prescribed prednisone . Initially, she was advised to stop taking it by a nurse at the hospital. She has stage four emphysema and is under the care of a pulmonologist, whom she sees regularly. She avoids going out during flu season to prevent respiratory infections. She reports coughing more recently but denies recent flu, fever, or chills.  Her medication regimen includes aripiprazole for depression and a nerve medication that helps with her shakiness. She has a history of a ruptured spleen, which limits her use of aspirin due to bleeding risks. She has been prescribed oxycodone  in the past month for pain management but does not receive it on a chronic basis.  She has concerns about weight gain despite eating well, including cereal and raisin bread at night. She has had a tetanus vaccine at [facility] and is awaiting a colon cancer screening kit to arrive by mail. She is  cautious about her health, maintaining cleanliness at home to avoid infections.  She has a follow-up with psychiatry pending and is in contact with a population health team member named Victoria, who has been supportive in managing her appointments and health needs. She is also considering dental care options as she has missing teeth due to past dental issues.      Medications: Outpatient Medications Prior to Visit  Medication Sig   albuterol  (VENTOLIN  HFA) 108 (90 Base) MCG/ACT inhaler Inhale 2 puffs into the lungs every 6 (six) hours as needed for wheezing or shortness of breath.   ARIPiprazole (ABILIFY) 10 MG tablet Take 1 tablet (10 mg total) by mouth daily.   cyanocobalamin  (VITAMIN B12) 1000 MCG tablet Take 2 tablets (2,000 mcg total) by mouth daily.   Glycopyrrolate -Formoterol  (BEVESPI  AEROSPHERE) 9-4.8 MCG/ACT AERO INHALE 2 PUFFS INTO THE LUNGS TWICE A DAY   Multiple Vitamin (MULTIVITAMIN WITH MINERALS) TABS tablet Take 1 tablet by mouth daily.   traZODone (DESYREL) 100 MG tablet Take 1 tablet (100 mg total) by mouth at bedtime. (Patient taking differently: Take 100 mg by mouth as needed for sleep.)   No facility-administered medications prior to visit.        Objective    BP (!) 153/85 (BP Location: Right Arm, Patient Position: Sitting, Cuff Size: Normal)   Pulse 79   Temp 97.6 F (36.4 C) (Oral)   Ht 5' 5 (1.651  m)   Wt 105 lb 9.6 oz (47.9 kg)   SpO2 96%   BMI 17.57 kg/m     Physical Exam Vitals and nursing note reviewed.  Constitutional:      General: She is not in acute distress.    Appearance: Normal appearance.  HENT:     Head: Normocephalic and atraumatic.  Eyes:     General: No scleral icterus.    Conjunctiva/sclera: Conjunctivae normal.  Cardiovascular:     Rate and Rhythm: Normal rate.  Pulmonary:     Effort: Pulmonary effort is normal.  Neurological:     Mental Status: She is alert and oriented to person, place, and time. Mental status is at  baseline.  Psychiatric:        Mood and Affect: Mood normal.        Behavior: Behavior normal.      No results found for any visits on 07/04/24.  Assessment & Plan    Chronic midline low back pain with sciatica, sciatica laterality unspecified -     Ambulatory referral to Pain Clinic  Centrilobular emphysema (HCC)  COPD, very severe (HCC)  Generalized anxiety disorder      Chronic midline low back pain with sciatica, sciatica laterality unspecified Pain worsens at night, likely due to mattress. Previous oxycodone  and prednisone  use. Lidocaine  patches not obtained; case worker assisting with Medicaid. - Continue previously prescribed prednisone : 5 tablets on day 1, 4 tablets on days 2 and 3, 3 tablets on days 4 and 5, 2 tablets on days 6, 7 and 8, 1 tablet on days 9 and 10. - Use warm compresses for pain relief. - Visit medical supply store for lidocaine  patches. - Referred to pain management.  Centrilobular emphysema; COPD, very severe; chronic respiratory failure with hypoxia Stage 4 emphysema with recent exacerbation treated with prednisone . Avoids flu and COVID vaccines due to past reactions. Regular pulmonology follow-up. - Continue regular follow-up with pulmonology. - Continue oxygen  2 L at bedtime and with activity as needed, with oxygen  saturation goal range 88-90%.  Generalized anxiety disorder Managed with aripiprazole and another medication. Reports improved sleep and reduced shakiness. - Continue current medications. - Follow up with psychiatry as scheduled.  Defer to specialist management.    Return if symptoms worsen or fail to improve.      I discussed the assessment and treatment plan with the patient  The patient was provided an opportunity to ask questions and all were answered. The patient agreed with the plan and demonstrated an understanding of the instructions.   The patient was advised to call back or seek an in-person evaluation if the symptoms  worsen or if the condition fails to improve as anticipated.    LAURAINE LOISE BUOY, DO  [facility] [phone] (phone) [phone] (fax)  [facility]

## 2024-07-04 NOTE — Patient Instructions (Addendum)
 [facility] [address] [place], [place], [place] [address] [phone]  [facility] [address] [place], [place], [place] [address]  [phone]  [facility] [address] [place], [place], [place] [address] [phone]   Recommended vaccines: pneumonia and shingles

## 2024-07-05 ENCOUNTER — Telehealth: Payer: Self-pay

## 2024-07-06 ENCOUNTER — Telehealth: Payer: Self-pay

## 2024-07-06 NOTE — Transitions of Care (Post Inpatient/ED Visit) (Signed)
 Care Management  Transitions of Care Program Transitions of Care Post-discharge week 2  07/06/2024 Name: Kelli Phillips MRN: [id number] DOB: December 22, 1950  Subjective: Kelli Phillips is a 73 y.o. year old female who is a primary care patient of Donzella Lauraine SAILOR, DO. The Care Management team was unable to reach the patient by phone to assess and address transitions of care needs.   This is second call attempt. Left a HIPAA approved voicemail message to phone number provided in demographics per DPR.    Plan: Additional outreach attempts will be made to reach the patient enrolled in the [facility] Program (Post Inpatient/ED Visit).  Richerd Fish, RN, BSN, CCM [facility], [facility] Management Coordinator Direct Dial: [phone]

## 2024-07-07 ENCOUNTER — Telehealth: Payer: Self-pay
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
## 2024-07-07 NOTE — Transitions of Care (Post Inpatient/ED Visit) (Signed)
 Transition of Care week 3  Visit Note  07/07/2024  Name: Kelli Phillips MRN: [id number]          DOB: [birth year]  Situation: Patient enrolled in [facility] 30-day program. Visit completed with patient by telephone.   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   COPD (chronic obstructive pulmonary disease) (HCC)    Emphysema lung (HCC)    Frequent headaches    GERD (gastroesophageal reflux disease)    History of chicken pox    Urine incontinence     Assessment: Patient Reported Symptoms: Cognitive Cognitive Status: Alert and oriented to person, place, and time, Normal speech and language skills, Able to follow simple commands      Neurological Neurological Review of Symptoms: No symptoms reported Neurological Management Strategies: Medication therapy  HEENT HEENT Symptoms Reported: No symptoms reported      Cardiovascular Cardiovascular Symptoms Reported: No symptoms reported Does patient have uncontrolled Hypertension?: No Cardiovascular Management Strategies: Routine screening Cardiovascular Self-Management Outcome: 4 (good)  Respiratory Respiratory Symptoms Reported: No symptoms reported Other Respiratory Symptoms: sinus from time to time, meds are working    Endocrine Endocrine Symptoms Reported: No symptoms reported Is patient diabetic?: No Endocrine Self-Management Outcome: 4 (good)  Gastrointestinal Gastrointestinal Symptoms Reported: No symptoms reported Additional Gastrointestinal Details: stay regular Gastrointestinal Management Strategies: Diet modification Gastrointestinal Self-Management Outcome: 4 (good)    Genitourinary Genitourinary Symptoms Reported: No symptoms reported Genitourinary Self-Management Outcome: 4 (good)  Integumentary Integumentary  Symptoms Reported: No symptoms reported    Musculoskeletal Musculoskelatal Symptoms Reviewed: No symptoms reported Additional Musculoskeletal Details: some pain chronic every now a d then but I feel really good today and cleaning my house:        Psychosocial Psychosocial Symptoms Reported: No symptoms reported (I feel really good today, I am not short of breath, my landlord is working with me and fixing the back door that makes me feel good and safer, so far all is good but I do still wish to move out of [place]) Behavioral Management Strategies: Coping strategies, Medication therapy Behavioral Health Self-Management Outcome: 4 (good) (Medication is really working and I think I have a therapist appointment this week and I will follow up on that)       There were no vitals filed for this visit.  Medications Reviewed Today     Reviewed by Eilleen Richerd GRADE, RN (Registered Nurse) on 07/07/24 at 0932  Med List Status: <None>   Medication Order Taking? Sig Documenting Provider Last Dose Status Informant  albuterol  (VENTOLIN  HFA) 108 (90 Base) MCG/ACT inhaler [id number] Yes Inhale 2 puffs into the lungs every 6 (six) hours as needed for wheezing or shortness of breath. Trudy Anthony HERO, MD  Active Pharmacy Records  ARIPiprazole (ABILIFY) 10 MG tablet [id number] Yes Take 1 tablet (10 mg total) by mouth daily. Jadapalle, Sree, MD  Active   cyanocobalamin  (VITAMIN B12) 1000 MCG tablet [id number] Yes Take 2 tablets (2,000 mcg total) by mouth daily. Hope Merle, MD  Active Pharmacy Records  Glycopyrrolate -Formoterol  (BEVESPI  AEROSPHERE) 9-4.8 MCG/ACT TERESE [id number] Yes INHALE 2 PUFFS INTO THE LUNGS TWICE A DAY Isadora Hose, MD  Active Pharmacy Records  Multiple Vitamin (MULTIVITAMIN WITH MINERALS) TABS tablet [id number] Yes Take 1 tablet by mouth daily. Donnelly Mellow, MD  Active   OXYGEN  [id number] Yes Inhale 2 L into the lungs daily. [provider]  Active   traZODone (DESYREL)  100 MG tablet [id number]  Take 1 tablet (100 mg total) by mouth at bedtime.  Patient taking differently: Take 100 mg by mouth as needed for sleep.   Donnelly Mellow, MD  Active            Med Note LESLY, RICHERD CINDERELLA Kitchens Jun 19, 2024  9:47 AM) Has it and took 1 but doesn't like it            Recommendation:   Continue Current Plan of Care Care gaps were reviewed with patient today and patient verbalized understanding to follow up with primary care provider during office visits and discuss any concerns.   Follow Up Plan:   Telephone follow-up in 1 week  Richerd Fish, RN, BSN, CCM [facility], [facility] Management Coordinator Direct Dial: [phone]

## 2024-07-12 ENCOUNTER — Telehealth: Payer: Self-pay

## 2024-07-12 NOTE — Transitions of Care (Post Inpatient/ED Visit) (Signed)
 07/12/2024  Patient ID: Kelli Phillips, female   DOB: 1951/04/25, 73 y.o.   MRN: [id number]  Incoming call from patient, HIPAA verified, name and DOB.  Patient called to say her phone has been out for a couple of days and wanted to write down her appointments for the week and when her follow up will be with PCP office.  Informed patient that she has an appointment on tomorrow with Tobias ROBINS 07/13/24 at 10:00 am, with this writer on 07/14/24 at 11:30 am and PCP with Lauraine Buoy, DO on 07/21/24 at 1:00 PM. Used teach-back method and patient verbalized understanding of telephonic appointments and office appointments.  Patient tearfully thanked clinical research associate for support and follow up.  Richerd Fish, RN, BSN, CCM [facility], [facility] Management Coordinator Direct Dial: [phone]

## 2024-07-13 ENCOUNTER — Telehealth: Payer: Self-pay

## 2024-07-13 ENCOUNTER — Other Ambulatory Visit: Payer: Self-pay | Admitting: Licensed Clinical Social Worker

## 2024-07-13 ENCOUNTER — Ambulatory Visit: Payer: Self-pay

## 2024-07-13 ENCOUNTER — Encounter: Payer: Self-pay | Admitting: Licensed Clinical Social Worker

## 2024-07-13 NOTE — Transitions of Care (Post Inpatient/ED Visit) (Signed)
 07/13/2024  Patient ID: Kelli Phillips, female   DOB: 1951-06-14, 73 y.o.   MRN: [id number]  Incoming voicemail received from patient that she had cancelled an appointment by mistake and she had called the number and she thought she was rescheduling for an office appointment this morning then realized it was with the social worker, Tobias.  Explained that I attempted out reach to [place], BSW to explain the misunderstanding as the patient thought she had to find a ride this morning. Sent a secure chat to Tobias Moose to explain patient's situation.  Noted another appointment not until December.  Richerd Fish, RN, BSN, CCM [facility], [facility] Management Coordinator Direct Dial: [phone]

## 2024-07-13 NOTE — Telephone Encounter (Signed)
 FYI Only or Action Required?: Action required by provider: Referral and pain medication.  Patient was last seen in primary care on 07/04/2024 by Donzella Lauraine SAILOR, DO.  Called Nurse Triage reporting Back Pain and Anxiety.  Symptoms began several years ago.  Interventions attempted: OTC medications: Tylenol  Ibuprofen , Prescription medications: Prednisone , and Rest, hydration, or home remedies.  Symptoms are: unchanged.  Triage Disposition: See PCP Within 2 Weeks  Patient/caregiver understands and will follow disposition?: Yes Reason for Disposition  Back pain present > 2 weeks  Answer Assessment - Initial Assessment Questions Patient states she does not want to take oxycodone  because she doesn't want to get addicted to it, then stated she wants PCP to call in a script for pain medication... patient is also looking for a referral to a back specialist because she is in so much pain and can't rest and nothing she is doing is working. Refused OV. Please Advise.  1. ONSET: When did the pain begin? (e.g., minutes, hours, days)     Years  2. SEVERITY: How bad is the pain?  (e.g., Scale 1-10; mild, moderate, or severe)     Severe  3. PATTERN: Is the pain constant? (e.g., yes, no; constant, intermittent)      Constant  4. CAUSE:  What do you think is causing the back pain?      Osteoporosis   5. MEDICINES: What have you taken so far for the pain? (e.g., nothing, acetaminophen , NSAIDS)     Advil , Tylenol   6. OTHER SYMPTOMS: Do you have any other symptoms? (e.g., fever, abdomen pain, burning with urination, blood in urine)       Denies  Protocols used: Back Pain-A-AH  Copied from CRM #[id number]. Topic: Clinical - Red Word Triage >> Jul 13, 2024 11:39 AM Selinda RAMAN wrote: Red Word that prompted transfer to Nurse Triage: The patient called in stating she is in a lot of pain and her nerves are on edge. I will transfer her to [facility] NT

## 2024-07-13 NOTE — Patient Outreach (Signed)
 07/13/2024  Patient called in to reschedule her appointment for today she was not feeling well, SW reminded her of the appointment with the [facility] on tomorrow 07/14/2024.   SW appointment has been rescheduled to Friday 08/11/2024 at 10:00 am

## 2024-07-13 NOTE — Patient Outreach (Signed)
 Social Drivers of Health  Community Resource and Care Coordination Visit Note   07/13/2024  Name: Kelli Phillips MRN: [id number] DOB:[birth year]  Situation: Referral received for [facility] needs assessment and assistance related to Financial Strain . I obtained verbal consent from Patient.  Visit completed with Patient on the phone.   Background:      Assessment:   Goals Addressed             This Visit's Progress    COMPLETED: BSW VBCI Social Work Care Plan       Problems:   [name]  and medical equipment for oxygen  machine  CSW Clinical Goal(s):   Over the next 2 days the Patient will will follow up with [employer] and PCP about order for oxygen  supplies as directed by Social Work.  Interventions:  SW will wait to hear back form patient about payment plan with [employer] a nd PCP about oxygen  supply order  Patient Goals/Self-Care Activities:  Coordinate with [employer]  to assist with payment plans for disconnection notice. And PCP order  Plan:   Telephone follow up appointment with care management team member scheduled for:  07/13/2024 at 10:00 am     VBCI Social Work Care Plan       Current SDOH Barriers:  Financial constraints related to unpaid water bill  Interventions: Provided patient with information about calling the water company in [place] and setting up a payment plan and to see what resources they may have for assistance. SW will also look for resources ad mail to patient           Recommendation:   Patient will call the water company to set up payment plan for bill  Follow Up Plan:   Telephone follow up appointment date/time:  08/03/2024 at 10 am  Tobias CHARM Maranda HEDWIG, PhD [facility], [facility] Social Worker Direct Dial: [phone]  Fax: [phone]

## 2024-07-13 NOTE — Patient Instructions (Signed)
 Visit Information  Thank you for taking time to visit with me today. Please don't hesitate to contact me if I can be of assistance to you before our next scheduled appointment.  Your next care management appointment is by telephone on 08/03/2024 at 10:00 am    Please call the care guide team at [phone] if you need to cancel, schedule, or reschedule an appointment.   Please call the Suicide and Crisis Lifeline: 988 go to [facility] Urgent [facility] [address], [place] [phone]) call 911 if you are experiencing a Mental Health or Behavioral Health Crisis or need someone to talk to.  Tobias CHARM Maranda HEDWIG, PhD [facility], [facility] Social Worker Direct Dial: [phone]  Fax: [phone]

## 2024-07-14 ENCOUNTER — Telehealth: Payer: Self-pay

## 2024-07-14 ENCOUNTER — Other Ambulatory Visit: Payer: Self-pay

## 2024-07-14 DIAGNOSIS — Z608 Other problems related to social environment: Secondary | ICD-10-CM | POA: Insufficient documentation

## 2024-07-14 LAB — CBC
HCT: 43.8 % (ref 36.0–46.0)
Hemoglobin: 14 g/dL (ref 12.0–15.0)
MCH: 29.3 pg (ref 26.0–34.0)
MCHC: 32 g/dL (ref 30.0–36.0)
MCV: 91.6 fL (ref 80.0–100.0)
Platelets: 195 K/uL (ref 150–400)
RBC: 4.78 MIL/uL (ref 3.87–5.11)
RDW: 12.9 % (ref 11.5–15.5)
WBC: 6.3 K/uL (ref 4.0–10.5)
nRBC: 0 % (ref 0.0–0.2)
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
## 2024-07-14 NOTE — Telephone Encounter (Signed)
 Copied from CRM #[id number]. Topic: General - Other >> Jul 14, 2024 10:24 AM Wess RAMAN wrote: Reason for CRM: Patient is requesting a call from '[place]' to discuss medciation  Callback #: [phone]

## 2024-07-15 ENCOUNTER — Emergency Department
Admission: EM | Admit: 2024-07-15 | Discharge: 2024-07-15 | Disposition: A | Discharge status: Home / Self Care | Attending: Emergency Medicine | Admitting: Emergency Medicine

## 2024-07-15 ENCOUNTER — Encounter: Payer: Self-pay | Admitting: Family Medicine

## 2024-07-15 ENCOUNTER — Other Ambulatory Visit: Payer: Self-pay | Admitting: Family Medicine

## 2024-07-15 DIAGNOSIS — G8929 Other chronic pain: Secondary | ICD-10-CM

## 2024-07-15 DIAGNOSIS — Z9189 Other specified personal risk factors, not elsewhere classified: Secondary | ICD-10-CM

## 2024-07-15 DIAGNOSIS — Z608 Other problems related to social environment: Secondary | ICD-10-CM | POA: Diagnosis not present

## 2024-07-15 LAB — COMPREHENSIVE METABOLIC PANEL WITH GFR
ALT: 13 U/L (ref 0–44)
AST: 18 U/L (ref 15–41)
Albumin: 4 g/dL (ref 3.5–5.0)
Alkaline Phosphatase: 83 U/L (ref 38–126)
Anion gap: 8 (ref 5–15)
BUN: 12 mg/dL (ref 8–23)
CO2: 30 mmol/L (ref 22–32)
Calcium: 9.5 mg/dL (ref 8.9–10.3)
Chloride: 105 mmol/L (ref 98–111)
Creatinine, Ser: 0.86 mg/dL (ref 0.44–1.00)
GFR, Estimated: >60 mL/min (ref >60)
Glucose, Bld: 91 mg/dL (ref 70–99)
Potassium: 4.1 mmol/L (ref 3.5–5.1)
Sodium: 144 mmol/L (ref 135–145)
Total Bilirubin: 0.5 mg/dL (ref 0.0–1.2)
Total Protein: 5.8 g/dL — ABNORMAL LOW (ref 6.5–8.1)

## 2024-07-15 LAB — ETHANOL: Alcohol, Ethyl (B): <15 mg/dL (ref <15)

## 2024-07-15 LAB — URINE DRUG SCREEN
Amphetamines: NEGATIVE
Barbiturates: NEGATIVE
Benzodiazepines: NEGATIVE
Cocaine: NEGATIVE
Fentanyl: NEGATIVE
Methadone Scn, Ur: NEGATIVE
Opiates: NEGATIVE
Tetrahydrocannabinol: NEGATIVE

## 2024-07-15 LAB — ACETAMINOPHEN LEVEL: Acetaminophen (Tylenol), Serum: <10 ug/mL — ABNORMAL LOW (ref 10–30)

## 2024-07-15 LAB — SALICYLATE LEVEL: Salicylate Lvl: <7 mg/dL — ABNORMAL LOW (ref 7.0–30.0)

## 2024-07-15 MED ORDER — TRAMADOL HCL 50 MG PO TABS: 50.0000 mg | ORAL_TABLET | Freq: Two times a day (BID) | ORAL | 0 refills | Status: AC | PRN

## 2024-07-15 MED ORDER — HYDROXYZINE HCL 10 MG PO TABS
10.0000 mg | ORAL_TABLET | Freq: Once | ORAL | Status: AC
  Administered 2024-07-15: 10 mg via ORAL
  Filled 2024-07-15: qty 1
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
## 2024-07-15 NOTE — Discharge Instructions (Addendum)
 As we discussed, we recommend you follow-up with [facility] and with your regular doctor.  Continue with your current plans move into a more safe environment.  We cannot find a record of you being prescribed Xanax, and that is not a safe medicine to give out in the emergency department, but we gave you a one-time dose of an alternative medicine to help you relax tonight.  Return to the emergency department if you develop new or worsening symptoms that concern you or if you feel like your life is in danger from your self or from others.

## 2024-07-17 ENCOUNTER — Telehealth: Payer: Self-pay

## 2024-07-17 NOTE — Patient Instructions (Signed)
 Visit Information  Thank you for taking time to visit with me today. Please don't hesitate to contact me if I can be of assistance to you before our next scheduled telephone appointment.  Our next appointment is by telephone on 07/20/24 at 1400  Following is a copy of your care plan:   Goals Addressed   None     The patient verbalized understanding of instructions, educational materials, and care plan provided today and DECLINED offer to receive copy of patient instructions, educational materials, and care plan.   Telephone follow up appointment with care management team member scheduled for: The patient has been provided with contact information for the care management team and has been advised to call with any health related questions or concerns.  The care management team will reach out to the patient again over the next 5- 10 business days.   Please call the care guide team at [phone] if you need to cancel or reschedule your appointment.   Please call the Suicide and Crisis Lifeline: 988 call the [place]  National Suicide Prevention Lifeline: 514 721 5956 or TTY: 504 399 6700 TTY (234)466-1122) to talk to a trained counselor call 1-800-273-TALK (toll free, 24 hour hotline) call 911 if you are experiencing a Mental Health or Behavioral Health Crisis or need someone to talk to.  Richerd Fish, RN, BSN, CCM [facility], [facility] Management Coordinator Direct Dial: [phone]

## 2024-07-17 NOTE — Transitions of Care (Post Inpatient/ED Visit) (Signed)
 07/17/2024  Patient ID: Kelli Phillips, female   DOB: November 03, 1950, 73 y.o.   MRN: [id number]  Patient called and left a voicemail stating she missed an appointment with this clinical research associate. Returned call to update patient our appointment is 07/20/24 unable to leave a messaged.  Richerd Fish, RN, BSN, CCM [facility], [facility] Management Coordinator Direct Dial: [phone]

## 2024-07-17 NOTE — Transitions of Care (Post Inpatient/ED Visit) (Deleted)
   07/17/2024  Name: Kelli Phillips MRN: [id number] DOB: 09/26/1950  {[id number]}

## 2024-07-20 ENCOUNTER — Ambulatory Visit: Payer: Self-pay

## 2024-07-20 ENCOUNTER — Telehealth: Payer: Self-pay

## 2024-07-20 ENCOUNTER — Other Ambulatory Visit: Payer: Self-pay

## 2024-07-20 DIAGNOSIS — J441 Chronic obstructive pulmonary disease with (acute) exacerbation: Secondary | ICD-10-CM

## 2024-07-20 DIAGNOSIS — G4734 Idiopathic sleep related nonobstructive alveolar hypoventilation: Secondary | ICD-10-CM

## 2024-07-20 DIAGNOSIS — F1994 Other psychoactive substance use, unspecified with psychoactive substance-induced mood disorder: Secondary | ICD-10-CM

## 2024-07-20 DIAGNOSIS — J449 Chronic obstructive pulmonary disease, unspecified: Secondary | ICD-10-CM

## 2024-07-20 DIAGNOSIS — J309 Allergic rhinitis, unspecified: Secondary | ICD-10-CM
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
## 2024-07-20 NOTE — Telephone Encounter (Signed)
 Copied from CRM [id number]. Topic: General - Other >> Jul 20, 2024  1:27 PM Jasmin G wrote: Reason for CRM: Pt called regarding recent missed call from clinic, I could not find info to relay. Call pt back if needed at [phone].

## 2024-07-20 NOTE — Telephone Encounter (Signed)
 FYI Only or Action Required?: Action required by provider: update on patient condition.  Patient was last seen in primary care on 07/04/2024 by Kelli Lauraine SAILOR, DO.  Called Nurse Triage reporting Back Pain.  Symptoms began yesterday.  Interventions attempted: OTC medications: Tylenol .  Symptoms are: unchanged.  Triage Disposition: See HCP Within 4 Hours (Or PCP Triage)  Patient/caregiver understands and will follow disposition?: No, refuses disposition         Copied from CRM [id number]. Topic: Clinical - Red Word Triage >> Jul 20, 2024 12:19 PM Kelli Phillips wrote: Red Word that prompted transfer to Nurse Triage: Extreme back pain. Advised of pain mngmt and ortho. Only relief is when she bends over. Pain where bra strap is located. Patient describe it as someone chewing on her bone. Reason for Disposition  [1] SEVERE back pain (e.g., excruciating, unable to do any normal activities) AND [2] not improved 2 hours after pain medicine  Answer Assessment - Initial Assessment Questions Pt refuses scheduling an appt but wants to get in with her two referrals (pain, ortho). This RN will send a HP message to clinic.   Back pain where bra strap is located near shoulder blade Onset: chronic issue but started back yesterday, worsening Only relief is when pt is bending over  SEVERITY: How bad is the pain?  (e.g., Scale 1-10; mild, moderate, or severe)     Severe  PATTERN: Is the pain constant? (e.g., yes, no; constant, intermittent)      Constant  RADIATION: Does the pain shoot into your legs or somewhere else?     Denies  MEDICINES: What have you taken so far for the pain? (e.g., nothing, acetaminophen , NSAIDS)     Tylenol , prednisone   Protocols used: Back Pain-A-AH

## 2024-07-21 ENCOUNTER — Ambulatory Visit: Admitting: Family Medicine

## 2024-07-21 NOTE — Telephone Encounter (Signed)
 Spoke with patient, she informed me that she was wanting something to help with pain, advised that she is currently listed as taking tramadol  and asked if she had been taking it. Patient advised that she never received this from pharmacy, advised patient to reach out to pharmacy and to start this medication as directed by dr pardue

## 2024-07-31 ENCOUNTER — Telehealth: Payer: Self-pay

## 2024-07-31 NOTE — Progress Notes (Signed)
 Complex Care Management Note  Care Guide Note 07/31/2024 Name: Kelli Phillips MRN: [id number] DOB: 1951-03-05  Kelli Phillips is a 74 y.o. year old female who sees Pardue, Lauraine SAILOR, DO for primary care. I reached out to Kelli Phillips by phone today to offer complex care management services.  Kelli Phillips was given information about Complex Care Management services today including:   The Complex Care Management services include support from the care team which includes your Nurse Care Manager, Clinical Social Worker, or Pharmacist.  The Complex Care Management team is here to help remove barriers to the health concerns and goals most important to you. Complex Care Management services are voluntary, and the patient may decline or stop services at any time by request to their care team member.   Complex Care Management Consent Status: Patient agreed to services and verbal consent obtained.   Follow up plan:  Telephone appointment with complex care management team member scheduled for:  08/17/24 @ 11 AM  Encounter Outcome:  Patient Scheduled  Leotis Rase [place], [facility] Guide  Direct Dial: [phone]  Fax [phone]

## 2024-08-01 ENCOUNTER — Telehealth: Payer: Self-pay | Admitting: Family
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
## 2024-08-01 NOTE — Telephone Encounter (Signed)
 Called to confirm/remind patient of their appointment at the Advanced Heart Failure Clinic on 08/02/24.   Appointment:   [x] Confirmed  [] Left mess   [] No answer/No voice mail  [] VM Full/unable to leave message  [] Phone not in service  Patient reminded to bring all medications and/or complete list.  Confirmed patient has transportation. Gave directions, instructed to utilize valet parking.

## 2024-08-02 ENCOUNTER — Encounter: Payer: Self-pay | Admitting: Family

## 2024-08-02 ENCOUNTER — Ambulatory Visit: Attending: Family | Admitting: Family

## 2024-08-02 VITALS — BP 115/75 | HR 74 | Wt 106.1 lb

## 2024-08-02 DIAGNOSIS — J449 Chronic obstructive pulmonary disease, unspecified: Secondary | ICD-10-CM

## 2024-08-02 DIAGNOSIS — I700 Atherosclerosis of aorta: Secondary | ICD-10-CM | POA: Diagnosis not present

## 2024-08-02 DIAGNOSIS — I5032 Chronic diastolic (congestive) heart failure: Secondary | ICD-10-CM | POA: Diagnosis not present

## 2024-08-02 NOTE — Patient Instructions (Signed)
 Medication Changes:  No medication changes today!  Lab Work:  Go over to the [facility]ICAL MALL. Go pass the gift shop and have your blood work completed.   We will only call you if the results are abnormal or if the provider would like to make medication changes.  No news is good news.   Follow-Up in: Follow up with the Advanced Heart Failure Clinic as needed. No need to make an appointment today! Give us  a call if you would like to be seen.   Thank you for choosing [facility].    At the Advanced Heart Failure Clinic, you and your health needs are our priority. We have a designated team specialized in the treatment of Heart Failure. This Care Team includes your primary Heart Failure Specialized Cardiologist (physician), Advanced Practice Providers (APPs- Physician Assistants and Nurse Practitioners), and Pharmacist who all work together to provide you with the care you need, when you need it.   You may see any of the following providers on your designated Care Team at your next follow up:  Dr. Toribio Fuel Dr. Ezra Shuck Dr. Ria Commander Dr. Morene Brownie Ellouise Class, FNP Jaun Bash, RPH-CPP  Please be sure to bring in all your medications bottles to every appointment.   Need to Contact Us :  If you have any questions or concerns before your next appointment please send us  a message through [place] or call our office at [phone].    TO LEAVE A MESSAGE FOR THE NURSE SELECT OPTION 2, PLEASE LEAVE A MESSAGE INCLUDING: YOUR NAME DATE OF BIRTH CALL BACK NUMBER REASON FOR CALL**this is important as we prioritize the call backs  YOU WILL RECEIVE A CALL BACK THE SAME DAY AS LONG AS YOU CALL BEFORE 4:00 PM

## 2024-08-03 ENCOUNTER — Encounter: Payer: Self-pay | Admitting: Licensed Clinical Social Worker

## 2024-08-03 ENCOUNTER — Other Ambulatory Visit: Payer: Self-pay | Admitting: Licensed Clinical Social Worker

## 2024-08-03 ENCOUNTER — Ambulatory Visit: Payer: Self-pay | Admitting: Family

## 2024-08-03 ENCOUNTER — Telehealth: Payer: Self-pay

## 2024-08-03 LAB — BASIC METABOLIC PANEL WITH GFR
BUN/Creatinine Ratio: 16 (ref 12–28)
BUN: 12 mg/dL (ref 8–27)
CO2: 25 mmol/L (ref 20–29)
Calcium: 9.7 mg/dL (ref 8.7–10.3)
Chloride: 102 mmol/L (ref 96–106)
Creatinine, Ser: 0.76 mg/dL (ref 0.57–1.00)
Glucose: 89 mg/dL (ref 70–99)
Potassium: 4.9 mmol/L (ref 3.5–5.2)
Sodium: 141 mmol/L (ref 134–144)
eGFR: 82 mL/min/1.73 (ref >59)

## 2024-08-03 NOTE — Patient Instructions (Signed)
 Kelli Phillips Finder - I am sorry I was unable to reach you today for our scheduled appointment. I work with Donzella Lauraine SAILOR, DO and am calling to support your healthcare needs. Please contact me at [phone] at your earliest convenience. I look forward to speaking with you soon.   Thank you,  Tobias CHARM Maranda HEDWIG, PhD [facility], [facility] Social Worker Direct Dial: [phone]  Fax: [phone]

## 2024-08-03 NOTE — Patient Outreach (Signed)
 Social Drivers of Health  Community Resource and Care Coordination Visit Note   08/03/2024  Name: Kelli Phillips MRN: [id number] DOB:Apr 12, 1951  Situation: Referral received for [facility] needs assessment and assistance related to Housing  Financial Strain . I obtained verbal consent from Patient.  Visit completed with Patient on the phone.   Background:      Assessment:   Goals Addressed             This Visit's Progress    BSWVBCI Social Work Care Plan   On track    Current SDOH Barriers:  Financial constraints related to unpaid water bill  Interventions: Provided patient with information about calling the water company in [place] and setting up a payment plan and to see what resources they may have for assistance. SW will also look for resources ad mail to patient. SW provided the number to 211 and the DSS LIEAP program            Recommendation:   attend all scheduled provider appointments follow up with [place] Housing authorityregarding housing needs SW provided the 211 and the [employer] for utility assistance,The patient stated that she is also working to get HUD housing so that she can move form where she is currently living  Follow Up Plan:   Telephone follow up appointment date/time:  08/17/2024 atr 9:30 am  Kelli CHARM Kelli HEDWIG, PhD [facility], [facility] Social Worker Direct Dial: [phone]  Fax: [phone]

## 2024-08-03 NOTE — Patient Instructions (Signed)
 Visit Information  Thank you for taking time to visit with me today. Please don't hesitate to contact me if I can be of assistance to you before our next scheduled appointment.  Your next care management appointment is by telephone on 08/17/2024 at 9:30 am   Please call the care guide team at [phone] if you need to cancel, schedule, or reschedule an appointment.   Please call the Suicide and Crisis Lifeline: 988 go to [facility] Urgent [facility] [address], [place] [phone]) call 911 if you are experiencing a Mental Health or Behavioral Health Crisis or need someone to talk to.  Tobias CHARM Maranda HEDWIG, PhD [facility], [facility] Social Worker Direct Dial: [phone]  Fax: [phone]

## 2024-08-03 NOTE — Transitions of Care (Post Inpatient/ED Visit) (Signed)
 08/03/2024  Patient ID: Kelli Phillips, female   DOB: 03-30-1951, 73 y.o.   MRN: [id number]  Contacted patient, HIPAA verified. Patient states she had not received a call to her knowledge the phone number was given to patient as provided in documentation of call at 10:13 am with Tobias, [place]. Phone number provided is [phone]. Patient states she is currently at [employer] and waiting on a cab to get back home and will call BSW, stating life is getting rough but she has to hang up cause she is waiting on the cab.  Patient verbalized appreciation for TOC calls states, helped me though some real rough times.  Richerd Fish, RN, BSN, CCM [facility], [facility] Management Coordinator Direct Dial: [phone]

## 2024-08-03 NOTE — Transitions of Care (Post Inpatient/ED Visit) (Signed)
 08/03/2024  Patient ID: Gaetana [name], female   DOB: 04-13-1951, 73 y.o.   MRN: [id number]  Patient left a voicemail stating she had not received a call from social worker that was scheduled at 10:00 AM.  Will return call to patient.  Richerd Fish, RN, BSN, CCM [facility], [facility] Management Coordinator Direct Dial: [phone]

## 2024-08-04 LAB — COLOGUARD

## 2024-08-09 ENCOUNTER — Ambulatory Visit: Admitting: Internal Medicine

## 2024-08-09 ENCOUNTER — Encounter: Payer: Self-pay | Admitting: Internal Medicine

## 2024-08-09 VITALS — BP 100/60 | HR 81 | Temp 98.4°F | Ht 65.0 in | Wt 106.0 lb

## 2024-08-09 DIAGNOSIS — J441 Chronic obstructive pulmonary disease with (acute) exacerbation: Secondary | ICD-10-CM

## 2024-08-09 DIAGNOSIS — J9611 Chronic respiratory failure with hypoxia: Secondary | ICD-10-CM | POA: Diagnosis not present

## 2024-08-09 DIAGNOSIS — J449 Chronic obstructive pulmonary disease, unspecified: Secondary | ICD-10-CM

## 2024-08-09 MED ORDER — AZITHROMYCIN 250 MG PO TABS: ORAL_TABLET | ORAL | 0 refills | Status: AC

## 2024-08-09 NOTE — Patient Instructions (Signed)
 Continue inhalers as prescribed Plan to check oxygen  levels while walking with 6-minute walk test Avoid Allergens and Irritants Avoid secondhand smoke Avoid SICK contacts Recommend  Masking  when appropriate Recommend Keep up-to-date with vaccinations Follow-up lung cancer screening annually

## 2024-08-09 NOTE — Progress Notes (Signed)
 PULMONARY OFFICE FOLLOW UP NOTE   SYNOPSIS 73 year old female with a past medical history of chronic hypoxic respiratory failure secondary to Gold 4B COPD presenting for an acute visit to refill her inhalers.  Her symptoms appear to be at baseline but she has had multiple no-shows recently.  She is using her oxygen  therapy at night and with exertion but not consistently.  She is currently maintained on LABA/LAMA therapy with Bevespi  and with a good response.  She is enrolled in our lung cancer screening program with her most recent CT from February 2025 returning reassuring with a 1 year follow-up scan recommended.   Testing: 07/26/19 PFT-  FEV1 0.68 (29%), ratio 41 Severe obstructive airway disease without sig BD response    CC Follow-up COPD assessment Follow-up oxygen  assessment Follow-up low-dose CT scan lung cancer screening program   SUBJECTIVE Chronic shortness of breath chronic dyspnea on exertion chronic wheezing Currently diagnosed with COPD  No respiratory distress Currently on Bevespi  inhaler seems to be working well Has several COPD exacerbations in the last couple years No exacerbation at this time, but mild symptoms will prescribe Z-Pak  Explained to patient she has severe COPD Effects of COPD are irreversible She has good exercise tolerance at this time   Patient on oxygen  therapy Uses a daily with exertion and at night Patient needs it for survival  Quit tobacco abuse 10 years ago 40 pack year tobacco abuse  CT lung cancer screening results reviewed with patient in detail Last scan was in February 2025 No significant findings multiple pulmonary nodules stable Assess for CAD   CBC    Component Value Date/Time   WBC 6.3 07/14/2024 2320   RBC 4.78 07/14/2024 2320   HGB 14.0 07/14/2024 2320   HGB 14.7 09/04/2014 1811   HCT 43.8 07/14/2024 2320   HCT 46.2 09/04/2014 1811   PLT 195 07/14/2024 2320   PLT 194 09/04/2014 1811   MCV 91.6 07/14/2024 2320    MCV 87 09/04/2014 1811   MCH 29.3 07/14/2024 2320   MCHC 32.0 07/14/2024 2320   RDW 12.9 07/14/2024 2320   RDW 14.4 09/04/2014 1811   LYMPHSABS 0.9 06/07/2024 2046   LYMPHSABS 1.4 09/04/2014 1811   MONOABS 0.2 06/07/2024 2046   MONOABS 0.3 09/04/2014 1811   EOSABS 0.1 06/07/2024 2046   EOSABS 0.0 09/04/2014 1811   BASOSABS 0.0 06/07/2024 2046   BASOSABS 0.0 09/04/2014 1811      Latest Ref Rng & Units 08/02/2024   10:38 AM 07/14/2024   11:20 PM 06/07/2024    8:46 PM  BMP  Glucose 70 - 99 mg/dL 89  91  878   BUN 8 - 27 mg/dL 12  12  8    Creatinine 0.57 - 1.00 mg/dL 9.23  9.13  9.63   BUN/Creat Ratio 12 - 28 16     Sodium 134 - 144 mmol/L 141  144  137   Potassium 3.5 - 5.2 mmol/L 4.9  4.1  3.9   Chloride 96 - 106 mmol/L 102  105  100   CO2 20 - 29 mmol/L 25  30  27    Calcium 8.7 - 10.3 mg/dL 9.7  9.5  8.9     BP 899/39   Pulse 81   Temp 98.4 F (36.9 C)   Ht 5' 5 (1.651 m)   Wt 106 lb (48.1 kg)   SpO2 93%   BMI 17.64 kg/m   Thin alert awake following commands Significant respiratory distress Clear to auscultation  no wheezing S1-S2 no murmurs Pulses intact No focal deficits   Assessment/Plan 73 yo  former smoker quit in 2009 (40 pack year hx). PMH significant for severe COPD, pulmonary emphysema, chronic respiratory failure.    Very severe COPD end-stage lung disease with chronic respiratory failure,FEV1 29% predicted Continue inhalers as prescribed Z-Pak for mild symptoms No indication for prednisone  at this time   Chronic Hypoxic resp failure due to COPD -Patient benefits from oxygen  therapy 2L [place]  -recommend using oxygen  as prescribed -patient needs this for survival   Personal history of tobacco use, presenting hazards to health - Following with lung cancer screening program   Allergic rhinitis - Continue Loratadine      MEDICATION ADJUSTMENTS/LABS AND TESTS ORDERED: Z-Pak as previously prescribed Continue inhalers as prescribed Avoid  secondhand smoke Avoid SICK contacts Recommend  Masking  when appropriate Recommend Keep up-to-date with vaccinations Lung cancer screening program follow-up Reassess oxygen  requirement with 6-minute walk test    Patient  satisfied with Plan of action and management. All questions answered   Follow up 6 months   I spent a total of 42 minutes dedicated to the care of this patient on the date of this encounter to include pre-visit review of records, face-to-face time with the patient discussing conditions above, post visit ordering of testing, clinical documentation with the electronic health record, making appropriate referrals as documented, and communicating necessary information to the patient's healthcare team.    The Patient requires high complexity decision making for assessment and support, frequent evaluation and titration of therapies, application of advanced monitoring technologies and extensive interpretation of multiple databases.  Patient satisfied with Plan of action and management. All questions answered    Nickolas Alm Cellar, M.D.  [facility] Pulmonary & Critical Care Medicine  Medical Director [facility] [place]

## 2024-08-10 ENCOUNTER — Ambulatory Visit

## 2024-08-11 ENCOUNTER — Other Ambulatory Visit: Payer: Self-pay | Admitting: Licensed Clinical Social Worker

## 2024-08-15 ENCOUNTER — Telehealth: Payer: Self-pay

## 2024-08-15 NOTE — Telephone Encounter (Signed)
 Copied from CRM #[id number]. Topic: Clinical - Order For Equipment >> Aug 14, 2024  4:24 PM Joesph PARAS wrote: Reason for CRM:  Patient is calling for two separate things regarding [employer], thinks she spoke to [place] with [employer].  Patient was told by [employer] that she has no need to do any testing, as she is already qualified for oxygen . States [employer] says she's going to cancel the walking test appointment because patient already has a little machine and is currently qualified.   Patient was also told by [employer] that she is required by law to have a big oxygen  machine in her home. She does not want it, as she is afraid of the machine.   Please advise patient, as she is distressed by what [place] with her DME has told her.

## 2024-08-16 ENCOUNTER — Other Ambulatory Visit: Payer: Self-pay | Admitting: Family Medicine

## 2024-08-16 NOTE — Telephone Encounter (Unsigned)
 Copied from CRM #[id number]. Topic: Clinical - Medication Refill >> Aug 16, 2024  4:15 PM Everette C wrote: Medication: traMADol  (ULTRAM ) 50 MG tablet [[id number]]   Has the patient contacted their pharmacy? Yes (Agent: If no, request that the patient contact the pharmacy for the refill. If patient does not wish to contact the pharmacy document the reason why and proceed with request.) (Agent: If yes, when and what did the pharmacy advise?)  This is the patient's preferred pharmacy:  [facility] [address] [place] - [address] [place] [address] Phone: [phone] Fax: [phone]  Is this the correct pharmacy for this prescription? Yes If no, delete pharmacy and type the correct one.   Has the prescription been filled recently? Yes  Is the patient out of the medication? Yes  Has the patient been seen for an appointment in the last year OR does the patient have an upcoming appointment? Yes  Can we respond through MyChart? No  Agent: Please be advised that Rx refills may take up to 3 business days. We ask that you follow-up with your pharmacy.

## 2024-08-17 ENCOUNTER — Other Ambulatory Visit: Payer: Self-pay

## 2024-08-17 ENCOUNTER — Other Ambulatory Visit: Payer: Self-pay | Admitting: Licensed Clinical Social Worker

## 2024-08-17 NOTE — Patient Outreach (Signed)
 RNCM - call for initial visit [phone]. Patiet provided description of program/services, reported not interested in [facility] at this time because she is taking good care of herself and has good doctors. Answered questions about Xanax - will make appointment with Psychiatrist - has info already. Declined to see PCP to discuss alternative meds for anxiety. Patient with questions about HUD, states was told they are closed - advised to call 211 and ask for [employer] for assistance  with applications. Verified has BSW Carla Nelson's number if further resource questions. Declined future follow up to determine if wants to enroll with RNCM. PCP notified.

## 2024-08-17 NOTE — Patient Outreach (Signed)
 Social Drivers of Health  Community Resource and Care Coordination Visit Note   08/17/2024  Name: Kelli Phillips MRN: [id number] DOB:09-Apr-1951  Situation: Referral received for [facility] needs assessment and assistance related to Financial Strain . I obtained verbal consent from Patient.  Visit completed with Patient on the phone.   Background:      Assessment:   Goals Addressed             This Visit's Progress    COMPLETED: BSWVBCI Social Work Care Plan       Current SDOH Barriers:  Financial constraints related to unpaid water bill  Interventions: Provided patient with information about calling the water company in [place] and setting up a payment plan and to see what resources they may have for assistance. SW will also look for resources ad mail to patient. SW provided the number to 211 and the DSS LIEAP program            Recommendation:   attend all scheduled provider appointments Patient had problems paying her water bill and it was at risk of being disconnected. Patient spoke with the water company and she got an extension to pay on 09/02/2024, and the patient will contact DSS to assist with paying. Patient also stated that she needed to get back on her nerve medication. SW encouraged her to call the PCP but also reminded her that she has an 11:00 am appointment with the [facility]. SW will close and can and notify the [facility] about the closeout and they Nerve medication request   Follow Up Plan:   Patient has achieved all patient stated goals. [employer] will be closed. Patient has been provided contact information should new needs arise.   Tobias CHARM Maranda HEDWIG, PhD [facility], [facility] Social Worker Direct Dial: [phone]  Fax: [phone]

## 2024-08-17 NOTE — Patient Instructions (Signed)

## 2024-08-18 ENCOUNTER — Telehealth: Payer: Self-pay

## 2024-08-18 DIAGNOSIS — J441 Chronic obstructive pulmonary disease with (acute) exacerbation: Secondary | ICD-10-CM

## 2024-08-18 DIAGNOSIS — F1994 Other psychoactive substance use, unspecified with psychoactive substance-induced mood disorder: Secondary | ICD-10-CM

## 2024-08-18 NOTE — Transitions of Care (Post Inpatient/ED Visit) (Signed)
 08/18/2024  Patient ID: Kelli Phillips, female   DOB: 1950/11/29, 73 y.o.   MRN: [id number]  Patient called and left two messages on voicemail requesting a call return. At 12:44 pm called patient back. Patient states she has been trying to get housing with HUD in [place] and was told their office is closed. She states she has contacted HUD in [place] and that they are not taking any new applications. She wanted child psychotherapist or this clinical research associate to assist in finding out why HUD in [place] has closed. She continues to speak about issues of feeling unsafe in her current neighborhood. She also states she is having issues with heating, last week, with the landlord piece-milling the system. She would like follow up with housing if possible.  States she is currently having cold symptoms with her chronic COPD. Spoke at length with patient about concerns and agency in [place] area. Patient would like social worker to follow up with her and spoke with her regarding having nursing to follow up with complex care team, patient agrees but states she would like this clinical research associate to continue. Explained about the 30 day program had completed. She verbalized understanding.  Plan: Will make a referral to [facility] social worker and refer patient back to longitudinal Complex Care team.  Richerd Fish, RN, BSN, CCM [place]  [facility], [facility] Health Care Management Coordinator Direct Dial: [phone]

## 2024-08-19 NOTE — Telephone Encounter (Signed)
 Requested medication (s) are due for refill today: Yes  Requested medication (s) are on the active medication list: Yes  Last refill:  08/09/24  Future visit scheduled: No  Notes to clinic:  Unable to refill per protocol, cannot delegate.      Requested Prescriptions  Pending Prescriptions Disp Refills   traMADol  (ULTRAM ) 50 MG tablet 30 tablet      Not Delegated - Analgesics:  Opioid Agonists Failed - 08/19/2024  7:49 AM      Failed - This refill cannot be delegated      Passed - Urine Drug Screen completed in last 360 days      Passed - Valid encounter within last 3 months    Recent Outpatient Visits           1 month ago Chronic midline low back pain with sciatica, sciatica laterality unspecified   [facility], Lauraine SAILOR, DO   1 month ago [facility]rilobular emphyse[facility]   [facility], Lauraine SAILOR, DO       Future Appointments             In 2 weeks Argentina Clap, MD [facility]

## 2024-08-24 LAB — COLOGUARD: COLOGUARD: NEGATIVE

## 2024-08-25 NOTE — Progress Notes (Deleted)
" °  Cardiology Office Note   Date:  08/25/2024  ID:  Kelli Phillips, Kelli Phillips [birth year], MRN [id number] PCP: Donzella Kelli SAILOR, DO  [facility] Providers Cardiologist:  None { Click to update primary MD,subspecialty MD or APP then REFRESH:1}    History of Present Illness Kelli Phillips is a 73 y.o. female PMH tobacco use, COPD who presents for further evaluation management of aortic atherosclerosis.  Seen by heart failure 08/02/2024.  Symptoms felt to be more consistent with COPD rather than HFpEF.  Previously saw Kelli Phillips.  No LDL on file.  Relevant CVD History -CT chest 10/2023 mild aortic atherosclerosis, mild CAC LAD - TTE 10/2022 normal biventricular function, no significant valvular disease   ROS: Pt denies any chest discomfort, jaw pain, arm pain, palpitations, syncope, presyncope, orthopnea, PND, or LE edema.  Studies Reviewed I have independently reviewed the patient's ECG, recent CT scan, previous cardiac testing, recent medical records.  Physical Exam VS:  There were no vitals taken for this visit.       Wt Readings from Last 3 Encounters:  08/09/24 106 lb (48.1 kg)  08/02/24 106 lb 2 oz (48.1 kg)  07/14/24 101 lb (45.8 kg)    GEN: No acute distress. NECK: No JVD; No carotid bruits. CARDIAC: ***RRR, no murmurs, rubs, gallops. RESPIRATORY:  Clear to auscultation. EXTREMITIES:  Warm and well-perfused. No edema.  ASSESSMENT AND PLAN CAC Aortic atherosclerosis DOE        {Are you ordering a CV Procedure (e.g. stress test, cath, DCCV, TEE, etc)?   Press F2        :[id number]}  Dispo: ***  Signed, Kelli Poser, MD  "

## 2024-08-26 ENCOUNTER — Other Ambulatory Visit: Payer: Self-pay | Admitting: Family Medicine

## 2024-08-26 DIAGNOSIS — M5440 Lumbago with sciatica, unspecified side: Secondary | ICD-10-CM

## 2024-08-29 ENCOUNTER — Telehealth: Payer: Self-pay

## 2024-08-29 ENCOUNTER — Ambulatory Visit: Payer: Self-pay | Admitting: Family Medicine

## 2024-08-29 NOTE — Telephone Encounter (Signed)
 Called patient concerning future order for Echocardiogram, patient stated she is unable to keep the her appointment on 09/05/23 with Dr. Argentina, and she had an Echocardiogram last year and the results were fine, she does need to have another Echocardiogram. Also, patient stated she has a lot going right now, and she will call on a later date and reschedule her Cardiology office visit. I expressed my understanding to the patient, and reminded her to please call the office to reschedule her Cardiology appointment.

## 2024-09-01 ENCOUNTER — Telehealth: Payer: Self-pay

## 2024-09-01 DIAGNOSIS — F411 Generalized anxiety disorder: Secondary | ICD-10-CM

## 2024-09-01 DIAGNOSIS — F22 Delusional disorders: Secondary | ICD-10-CM

## 2024-09-01 NOTE — Telephone Encounter (Signed)
 Copied from CRM [id number]. Topic: Referral - Request for Referral >> Aug 30, 2024  1:28 PM Hadassah PARAS wrote: Did the patient discuss referral with their provider in the last year? Yes (If No - schedule appointment) (If Yes - send message)  Appointment offered? Yes  Type of order/referral and detailed reason for visit: anxiety  Preference of office, provider, location: private psychiatrist, close to home. None at [facility]   If referral order, have you been seen by this specialty before? Yes (If Yes, this issue or another issue? When? Where? [facility], had issues with them due to HIPAA violations. They disclosed information to a lawyer w/o permission  Can we respond through MyChart? No, please call on #[phone]

## 2024-09-04 ENCOUNTER — Ambulatory Visit

## 2024-09-11 ENCOUNTER — Ambulatory Visit

## 2024-09-11 DIAGNOSIS — J449 Chronic obstructive pulmonary disease, unspecified: Secondary | ICD-10-CM | POA: Diagnosis not present

## 2024-09-11 DIAGNOSIS — J441 Chronic obstructive pulmonary disease with (acute) exacerbation: Secondary | ICD-10-CM

## 2024-09-11 DIAGNOSIS — J9611 Chronic respiratory failure with hypoxia: Secondary | ICD-10-CM

## 2024-09-11 NOTE — Progress Notes (Signed)
 Patient was seen in the office for a 6 min walk.

## 2024-09-12 ENCOUNTER — Other Ambulatory Visit: Payer: Self-pay

## 2024-09-12 NOTE — Patient Outreach (Signed)
 RNCM - noted patient rescheduled after initially declining to work with [facility] for health issues. Called for scheduled appointment and spoke with patient. Again declining to participate in Nursing Care Management, stated she has no needs, follows up with doctors, takes her medications, my COPD is good and declined visit. Did want to speak with BSW again regarding resources, glasses, dental. Asked about Psychiatric referral noted in chart, states does  not need assist connecting. Scheduled with BSW as requested.

## 2024-09-18 ENCOUNTER — Ambulatory Visit: Payer: Self-pay | Admitting: Internal Medicine

## 2024-09-18 NOTE — Telephone Encounter (Signed)
 FYI Only or Action Required?: Action required by provider: medication refill request, clinical question for provider, and update on patient condition.  Patient is followed in Pulmonology for COPD, last seen on 08/09/2024 by Isaiah Scrivener, MD.  Called Nurse Triage reporting Cough.  Symptoms began several days ago.  Interventions attempted: Increased fluids/rest.  Symptoms are: unchanged.  Triage Disposition: Home Care  Patient/caregiver understands and will follow disposition?: Yes     Message from [facility] R sent at 09/18/2024 10:39 AM EST  Reason for Triage: Worsening cough with green mucus, seeking Z-pack.    Reason for Disposition  Cough with cold symptoms (e.g., runny nose, postnasal drip, throat clearing)  Answer Assessment - Initial Assessment Questions Pt called requesting Zpak to [facility] via delivery. Pt states that she has COPD and developed a productive cough last Friday, 09/15/24 that worsened over the weekend with green phlegm yesterday. Pt states she is able to clear mucous and has been drinking warm fluids with honey and lemon. Pt denies fever, no SOB. Please advise.     1. ONSET: When did the cough begin?      09/15/24  2. SEVERITY: How bad is the cough today?      Mild to moderate   3. SPUTUM: Describe the color of your sputum (e.g., none, dry cough; clear, white, yellow, green)     Green   4. HEMOPTYSIS: Are you coughing up any blood? If Yes, ask: How much? (e.g., flecks, streaks, tablespoons, etc.)     No   5. DIFFICULTY BREATHING: Are you having difficulty breathing? If Yes, ask: How bad is it? (e.g., mild, moderate, severe)      No   6. FEVER: Do you have a fever? If Yes, ask: What is your temperature, how was it measured, and when did it start?     No; pt reports she has had chills    8. LUNG HISTORY: Do you have any history of lung disease?  (e.g., pulmonary embolus, asthma, emphysema)     COPD    10. OTHER SYMPTOMS: Do you  have any other symptoms? (e.g., runny nose, wheezing, chest pain)       No  Protocols used: Cough - Acute Productive-A-AH

## 2024-09-19 ENCOUNTER — Other Ambulatory Visit: Payer: Self-pay | Admitting: Internal Medicine

## 2024-09-19 DIAGNOSIS — J441 Chronic obstructive pulmonary disease with (acute) exacerbation: Secondary | ICD-10-CM

## 2024-09-19 MED ORDER — AZITHROMYCIN 250 MG PO TABS: ORAL_TABLET | ORAL | 0 refills | Status: AC

## 2024-09-19 MED ORDER — PREDNISONE 20 MG PO TABS: 20.0000 mg | ORAL_TABLET | Freq: Every day | ORAL | 1 refills | Status: AC

## 2024-09-19 NOTE — Telephone Encounter (Signed)
 LVM to notify pt that meds have been called in. NFN

## 2024-09-19 NOTE — Progress Notes (Signed)
 Z pak and PRED for COPD exacerbation

## 2024-09-19 NOTE — Telephone Encounter (Signed)
 LVM

## 2024-09-20 ENCOUNTER — Telehealth: Payer: Self-pay

## 2024-09-20 NOTE — Telephone Encounter (Unsigned)
 Copied from CRM #[id number]. Topic: General - Other >> Sep 20, 2024 12:34 PM Rea ORN wrote: Reason for CRM: Pt stated she keeps getting calls insisting she hasn't done a cologuard test and needs to do one. I advised pt that PCP already gave her results from her completed cologuard test on 08/29/24 and that she is not due to received another test for 3 years. I told her that if she gets a call like that it may be a scam. I advised pt never to give her information out to these types of callers and to call the clinic to vet the info.   *sending this CRM to alert office of possible scam call

## 2024-09-25 ENCOUNTER — Telehealth: Payer: Self-pay | Admitting: Licensed Clinical Social Worker

## 2024-09-25 ENCOUNTER — Encounter: Payer: Self-pay | Admitting: Licensed Clinical Social Worker

## 2024-09-26 ENCOUNTER — Telehealth: Payer: Self-pay

## 2024-09-26 NOTE — Progress Notes (Unsigned)
 Complex Care Management Note Care Guide Note  09/26/2024 Name: Kelli Phillips MRN: [id number] DOB: [birth year]   Complex Care Management Outreach Attempts: An unsuccessful telephone outreach was attempted today to offer the patient information about available complex care management services.  Follow Up Plan:  Additional outreach attempts will be made to offer the patient complex care management information and services.   Encounter Outcome:  Patient Request to Call Back  Leotis Rase [facility], [facility] Guide  Direct Dial: [phone]  Fax [phone]

## 2024-10-10 ENCOUNTER — Telehealth: Payer: Self-pay | Admitting: Licensed Clinical Social Worker

## 2024-10-10 ENCOUNTER — Ambulatory Visit: Admitting: Student in an Organized Health Care Education/Training Program

## 2024-10-19 ENCOUNTER — Ambulatory Visit

## 2025-02-20 ENCOUNTER — Ambulatory Visit
